# Patient Record
Sex: Female | Born: 1935 | Race: Black or African American | Hispanic: No | Smoking: Never smoker
Health system: Southern US, Community
[De-identification: ages and names within clinical notes are randomized; demographics above are authoritative.]

## PROBLEM LIST (undated history)

## (undated) DIAGNOSIS — E785 Hyperlipidemia, unspecified: Secondary | ICD-10-CM

## (undated) DIAGNOSIS — I10 Essential (primary) hypertension: Secondary | ICD-10-CM

## (undated) DIAGNOSIS — F0390 Unspecified dementia without behavioral disturbance: Secondary | ICD-10-CM

## (undated) DIAGNOSIS — D869 Sarcoidosis, unspecified: Secondary | ICD-10-CM

## (undated) DIAGNOSIS — M1990 Unspecified osteoarthritis, unspecified site: Secondary | ICD-10-CM

## (undated) DIAGNOSIS — I509 Heart failure, unspecified: Secondary | ICD-10-CM

## (undated) HISTORY — DX: Hyperlipidemia, unspecified: E78.5

## (undated) HISTORY — DX: Sarcoidosis, unspecified: D86.9

## (undated) HISTORY — DX: Essential (primary) hypertension: I10

## (undated) HISTORY — PX: TOTAL KNEE ARTHROPLASTY: SHX125

## (undated) HISTORY — DX: Heart failure, unspecified: I50.9

## (undated) HISTORY — DX: Unspecified dementia, unspecified severity, without behavioral disturbance, psychotic disturbance, mood disturbance, and anxiety: F03.90

## (undated) HISTORY — DX: Unspecified osteoarthritis, unspecified site: M19.90

## (undated) HISTORY — PX: ABDOMINAL HYSTERECTOMY: SHX81

---

## 2006-03-30 ENCOUNTER — Encounter: Admission: RE | Admit: 2006-03-30 | Discharge: 2006-03-30 | Payer: Self-pay | Admitting: Cardiovascular Disease

## 2006-04-04 ENCOUNTER — Ambulatory Visit (HOSPITAL_COMMUNITY): Admission: RE | Admit: 2006-04-04 | Discharge: 2006-04-04 | Payer: Self-pay | Admitting: Cardiovascular Disease

## 2006-04-25 ENCOUNTER — Encounter: Admission: RE | Admit: 2006-04-25 | Discharge: 2006-04-25 | Payer: Self-pay | Admitting: Internal Medicine

## 2007-06-21 ENCOUNTER — Encounter: Admission: RE | Admit: 2007-06-21 | Discharge: 2007-06-21 | Payer: Self-pay | Admitting: Internal Medicine

## 2008-08-20 ENCOUNTER — Encounter: Admission: RE | Admit: 2008-08-20 | Discharge: 2008-08-20 | Payer: Self-pay | Admitting: Internal Medicine

## 2010-08-24 ENCOUNTER — Encounter
Admission: RE | Admit: 2010-08-24 | Discharge: 2010-08-24 | Payer: Self-pay | Source: Home / Self Care | Attending: Internal Medicine | Admitting: Internal Medicine

## 2010-10-13 ENCOUNTER — Ambulatory Visit: Payer: Medicare Other | Attending: Neurology | Admitting: Physical Therapy

## 2010-10-13 DIAGNOSIS — R293 Abnormal posture: Secondary | ICD-10-CM | POA: Insufficient documentation

## 2010-10-13 DIAGNOSIS — IMO0001 Reserved for inherently not codable concepts without codable children: Secondary | ICD-10-CM | POA: Insufficient documentation

## 2010-10-13 DIAGNOSIS — R269 Unspecified abnormalities of gait and mobility: Secondary | ICD-10-CM | POA: Insufficient documentation

## 2010-10-19 ENCOUNTER — Ambulatory Visit: Payer: Medicare Other | Attending: Neurology | Admitting: Physical Therapy

## 2010-10-19 DIAGNOSIS — IMO0001 Reserved for inherently not codable concepts without codable children: Secondary | ICD-10-CM | POA: Insufficient documentation

## 2010-10-19 DIAGNOSIS — R293 Abnormal posture: Secondary | ICD-10-CM | POA: Insufficient documentation

## 2010-10-19 DIAGNOSIS — R269 Unspecified abnormalities of gait and mobility: Secondary | ICD-10-CM | POA: Insufficient documentation

## 2010-10-23 ENCOUNTER — Ambulatory Visit: Payer: Medicare Other | Admitting: Physical Therapy

## 2010-10-26 ENCOUNTER — Ambulatory Visit: Payer: Medicare Other | Admitting: Physical Therapy

## 2010-10-28 ENCOUNTER — Ambulatory Visit: Payer: Medicare Other | Admitting: Physical Therapy

## 2010-11-02 ENCOUNTER — Ambulatory Visit: Payer: Medicare Other | Admitting: Physical Therapy

## 2010-11-04 ENCOUNTER — Ambulatory Visit: Payer: Medicare Other | Admitting: Physical Therapy

## 2010-11-10 ENCOUNTER — Ambulatory Visit: Payer: BC Managed Care – PPO | Admitting: Physical Therapy

## 2010-11-12 ENCOUNTER — Ambulatory Visit: Payer: Medicare Other | Admitting: Physical Therapy

## 2010-11-16 ENCOUNTER — Ambulatory Visit: Payer: BC Managed Care – PPO | Admitting: Physical Therapy

## 2010-11-24 ENCOUNTER — Ambulatory Visit: Payer: Medicare Other | Attending: Neurology | Admitting: Physical Therapy

## 2010-11-24 DIAGNOSIS — IMO0001 Reserved for inherently not codable concepts without codable children: Secondary | ICD-10-CM | POA: Insufficient documentation

## 2010-11-24 DIAGNOSIS — R293 Abnormal posture: Secondary | ICD-10-CM | POA: Insufficient documentation

## 2010-11-24 DIAGNOSIS — R269 Unspecified abnormalities of gait and mobility: Secondary | ICD-10-CM | POA: Insufficient documentation

## 2010-11-26 ENCOUNTER — Encounter: Payer: BC Managed Care – PPO | Admitting: Physical Therapy

## 2010-11-26 ENCOUNTER — Ambulatory Visit: Payer: Medicare Other | Admitting: Physical Therapy

## 2010-11-30 ENCOUNTER — Ambulatory Visit: Payer: Medicare Other | Admitting: Physical Therapy

## 2010-12-02 ENCOUNTER — Ambulatory Visit: Payer: Medicare Other | Admitting: Physical Therapy

## 2010-12-08 ENCOUNTER — Ambulatory Visit: Payer: Medicare Other | Admitting: Physical Therapy

## 2010-12-10 ENCOUNTER — Ambulatory Visit: Payer: Medicare Other | Admitting: Physical Therapy

## 2010-12-15 ENCOUNTER — Encounter: Payer: BC Managed Care – PPO | Admitting: Physical Therapy

## 2010-12-17 ENCOUNTER — Encounter: Payer: BC Managed Care – PPO | Admitting: Physical Therapy

## 2011-01-01 NOTE — Cardiovascular Report (Signed)
NAMEMarland Kitchen  Connie Poole, Connie Poole NO.:  [id number]   MEDICAL RECORD NO.:  [id number]          PATIENT TYPE:  OIB   LOCATION:  2899                         FACILITY:  [facility]   PHYSICIAN:  Nanetta Batty, M.D.   DATE OF BIRTH:  08-25-1920   DATE OF PROCEDURE:  04/04/2006  DATE OF DISCHARGE:                              CARDIAC CATHETERIZATION   Connie Poole is an 77 year old African-American female with history of  progressive dyspnea on exertion.  Her other issues include diabetes,  hypertension, and hyperlipidemia.  She had a Myoview stress test which  showed anterolateral and inferolateral ischemia.  She presents now for  diagnostic coronary arteriography to rule out an ischemic etiology.   PROCEDURE DESCRIPTION:  Patient was brought to the second floor [place]  cardiac catheterization laboratory in a post absorptive state.  She was pre  medicated with p.o. Valium.  Her right groin was prepped and shaved in the  usual sterile fashion.  1% Xylocaine was used for local anesthesia.  A 6-  French sheath was inserted into the right femoral artery using standard  Seldinger technique.  A 6-French right and left Judkins diagnostic catheter  along with a 6-French pigtail catheter were used for selective coronary  angiography, left ventriculography, and distal abdominal aortography.  Visipaque dye was used for the entirety of the case.  Retrograde aorta, left  ventricular, and pullback pressures were recorded.   HEMODYNAMICS:  1. Aortic systolic pressure 170, diastolic pressure 64.  2. Left ventricular systolic pressure 170, end-diastolic pressure 12.   SELECTIVE CORONARY ANGIOGRAPHY:  1. Left main normal.  2. LAD:  LAD had a 30-40% stenosis in the proximal third at the takeoff of      first septal perforator branch.  3. Left circumflex; free of disease.  4. Right coronary; free of disease.  5. Left ventriculography; RAO left ventriculogram was performed using hand  injection because of inability to well seat the pigtail catheter.      Appeared to be normal LV function.  6. Distal abdominal aortography; distal abdominal aortogram was performed      using 20 mL of Visipaque dye at 20 mL per second.  The renal arteries      were widely patent.  The infrarenal abdominal aorta and iliac      bifurcation appear free of significant atherosclerotic changes.   IMPRESSION:  Ms. Lover has essentially normal coronary arteries and normal  left ventricular function.  I believe her Myoview abnormalities were  artifactual and her dyspnea probably is related to diastolic dysfunction  from hypertension.  Her renal arteries are normal suggesting essential  hypertension.  Continued medical therapy will be recommended.   The sheath was removed and pressure was held on the groin to achieve  hemostasis.  Patient left lab in stable condition.  She will be discharged  home after remaining __________ for six hours.  Will see a P.A. back in the  office in two weeks for groin check and me back in office in three months  for further evaluation.      Nanetta Batty,  M.D.  Electronically Signed     JB/MEDQ  D:  04/04/2006  T:  04/04/2006  Job:  [id number]   cc:   Cath Lab  [facility]  Olene Craven, M.D.

## 2011-01-14 ENCOUNTER — Encounter (INDEPENDENT_AMBULATORY_CARE_PROVIDER_SITE_OTHER): Payer: Self-pay | Admitting: Surgery

## 2011-09-22 ENCOUNTER — Ambulatory Visit
Admission: RE | Admit: 2011-09-22 | Discharge: 2011-09-22 | Disposition: A | Discharge status: Home / Self Care | Payer: Medicare Other | Source: Ambulatory Visit | Attending: Internal Medicine | Admitting: Internal Medicine

## 2011-09-22 ENCOUNTER — Other Ambulatory Visit: Payer: Self-pay | Admitting: Internal Medicine

## 2011-09-22 ENCOUNTER — Ambulatory Visit
Admission: RE | Admit: 2011-09-22 | Discharge: 2011-09-22 | Disposition: A | Discharge status: Home / Self Care | Payer: BC Managed Care – PPO | Source: Ambulatory Visit | Attending: Internal Medicine | Admitting: Internal Medicine

## 2011-09-22 DIAGNOSIS — R52 Pain, unspecified: Secondary | ICD-10-CM

## 2012-05-09 ENCOUNTER — Encounter (INDEPENDENT_AMBULATORY_CARE_PROVIDER_SITE_OTHER): Payer: Self-pay | Admitting: General Surgery

## 2012-06-09 ENCOUNTER — Encounter (INDEPENDENT_AMBULATORY_CARE_PROVIDER_SITE_OTHER): Payer: Self-pay

## 2012-06-13 ENCOUNTER — Ambulatory Visit (INDEPENDENT_AMBULATORY_CARE_PROVIDER_SITE_OTHER): Payer: Medicare Other | Admitting: General Surgery

## 2012-06-13 VITALS — BP 166/74 | HR 67 | Temp 97.9°F | Ht 62.0 in | Wt 197.6 lb

## 2012-06-13 DIAGNOSIS — K420 Umbilical hernia with obstruction, without gangrene: Secondary | ICD-10-CM | POA: Insufficient documentation

## 2012-06-13 NOTE — Addendum Note (Signed)
Addended by: Milas Hock on: 06/13/2012 11:33 AM   Modules accepted: Orders

## 2012-06-13 NOTE — Addendum Note (Signed)
Addended by: Milas Hock on: 06/13/2012 12:13 PM   Modules accepted: Orders

## 2012-06-13 NOTE — Progress Notes (Signed)
The patient is doing well since her last clinic visit to see me. Her periumbilical hernia has increased in size but is minimally symptomatic.  The patient has occasional bloating which causes a hernia to enlarge in size. However she's had no nausea or vomiting and minimal to no tenderness in that area.  On examination today the patient has a large umbilical hernia which is minimally tender to palpation. I can not get it reduced on examination.  The patient has early dementia which seems to be exacerbated in the morning. My concern is that the patient is to get anesthesia and this may cause a significant change in her neurologic status. Because of this I would prefer to get a CT scan of her abdomen and pelvis to confirm if there is bowel in the hernia. There is bowel in the hernia then I would recommend that we go ahead and get it repaired.  So we will go ahead and schedule a CT scan of the abdomen and pelvis with oral contrast.

## 2012-06-20 ENCOUNTER — Ambulatory Visit (HOSPITAL_COMMUNITY)
Admission: RE | Admit: 2012-06-20 | Discharge: 2012-06-20 | Disposition: A | Discharge status: Home / Self Care | Payer: Medicare Other | Source: Ambulatory Visit | Attending: General Surgery | Admitting: General Surgery

## 2012-06-20 ENCOUNTER — Other Ambulatory Visit (INDEPENDENT_AMBULATORY_CARE_PROVIDER_SITE_OTHER): Payer: Self-pay

## 2012-06-20 ENCOUNTER — Encounter (HOSPITAL_COMMUNITY): Payer: Self-pay

## 2012-06-20 DIAGNOSIS — Q619 Cystic kidney disease, unspecified: Secondary | ICD-10-CM | POA: Insufficient documentation

## 2012-06-20 DIAGNOSIS — K429 Umbilical hernia without obstruction or gangrene: Secondary | ICD-10-CM | POA: Insufficient documentation

## 2012-06-20 DIAGNOSIS — K420 Umbilical hernia with obstruction, without gangrene: Secondary | ICD-10-CM

## 2012-06-20 DIAGNOSIS — K8020 Calculus of gallbladder without cholecystitis without obstruction: Secondary | ICD-10-CM | POA: Insufficient documentation

## 2012-06-20 LAB — CREATININE, SERUM
Creatinine, Ser: 1.13 mg/dL — ABNORMAL HIGH (ref 0.50–1.10)
GFR calc Af Amer: 48 mL/min — ABNORMAL LOW (ref >90)

## 2012-06-20 MED ORDER — IOHEXOL 300 MG/ML  SOLN
100.0000 mL | Freq: Once | INTRAMUSCULAR | Status: AC | PRN
  Administered 2012-06-20: 100 mL via INTRAVENOUS

## 2012-08-11 ENCOUNTER — Emergency Department (HOSPITAL_BASED_OUTPATIENT_CLINIC_OR_DEPARTMENT_OTHER)
Admission: EM | Admit: 2012-08-11 | Discharge: 2012-08-11 | Disposition: A | Discharge status: Home / Self Care | Payer: Medicare Other | Attending: Emergency Medicine | Admitting: Emergency Medicine

## 2012-08-11 ENCOUNTER — Encounter (HOSPITAL_BASED_OUTPATIENT_CLINIC_OR_DEPARTMENT_OTHER): Payer: Self-pay | Admitting: *Deleted

## 2012-08-11 ENCOUNTER — Emergency Department (HOSPITAL_BASED_OUTPATIENT_CLINIC_OR_DEPARTMENT_OTHER): Payer: Medicare Other

## 2012-08-11 DIAGNOSIS — H9209 Otalgia, unspecified ear: Secondary | ICD-10-CM | POA: Insufficient documentation

## 2012-08-11 DIAGNOSIS — Z7982 Long term (current) use of aspirin: Secondary | ICD-10-CM | POA: Insufficient documentation

## 2012-08-11 DIAGNOSIS — R509 Fever, unspecified: Secondary | ICD-10-CM | POA: Insufficient documentation

## 2012-08-11 DIAGNOSIS — E119 Type 2 diabetes mellitus without complications: Secondary | ICD-10-CM | POA: Insufficient documentation

## 2012-08-11 DIAGNOSIS — F0390 Unspecified dementia without behavioral disturbance: Secondary | ICD-10-CM | POA: Insufficient documentation

## 2012-08-11 DIAGNOSIS — I110 Hypertensive heart disease with heart failure: Secondary | ICD-10-CM | POA: Insufficient documentation

## 2012-08-11 DIAGNOSIS — H409 Unspecified glaucoma: Secondary | ICD-10-CM | POA: Insufficient documentation

## 2012-08-11 DIAGNOSIS — Z8709 Personal history of other diseases of the respiratory system: Secondary | ICD-10-CM | POA: Insufficient documentation

## 2012-08-11 DIAGNOSIS — B9789 Other viral agents as the cause of diseases classified elsewhere: Secondary | ICD-10-CM | POA: Insufficient documentation

## 2012-08-11 DIAGNOSIS — Z79899 Other long term (current) drug therapy: Secondary | ICD-10-CM | POA: Insufficient documentation

## 2012-08-11 DIAGNOSIS — B349 Viral infection, unspecified: Secondary | ICD-10-CM

## 2012-08-11 DIAGNOSIS — E785 Hyperlipidemia, unspecified: Secondary | ICD-10-CM | POA: Insufficient documentation

## 2012-08-11 DIAGNOSIS — Z8739 Personal history of other diseases of the musculoskeletal system and connective tissue: Secondary | ICD-10-CM | POA: Insufficient documentation

## 2012-08-11 DIAGNOSIS — H9202 Otalgia, left ear: Secondary | ICD-10-CM

## 2012-08-11 DIAGNOSIS — I509 Heart failure, unspecified: Secondary | ICD-10-CM | POA: Insufficient documentation

## 2012-08-11 LAB — CBC WITH DIFFERENTIAL/PLATELET
Basophils Absolute: 0 10*3/uL (ref 0.0–0.1)
Basophils Relative: 0 % (ref 0–1)
Eosinophils Absolute: 0.1 10*3/uL (ref 0.0–0.7)
Eosinophils Relative: 1 % (ref 0–5)
HCT: 33.6 % — ABNORMAL LOW (ref 36.0–46.0)
Lymphocytes Relative: 37 % (ref 12–46)
MCH: 28.1 pg (ref 26.0–34.0)
MCHC: 32.1 g/dL (ref 30.0–36.0)
MCV: 87.5 fL (ref 78.0–100.0)
Monocytes Absolute: 0.6 10*3/uL (ref 0.1–1.0)
RDW: 13.2 % (ref 11.5–15.5)

## 2012-08-11 LAB — COMPREHENSIVE METABOLIC PANEL
AST: 18 U/L (ref 0–37)
CO2: 28 mEq/L (ref 19–32)
Calcium: 9.5 mg/dL (ref 8.4–10.5)
Creatinine, Ser: 0.9 mg/dL (ref 0.50–1.10)
GFR calc non Af Amer: 55 mL/min — ABNORMAL LOW (ref >90)
Total Protein: 6.9 g/dL (ref 6.0–8.3)

## 2012-08-11 MED ORDER — IBUPROFEN 600 MG PO TABS: 600.0000 mg | ORAL_TABLET | Freq: Four times a day (QID) | ORAL | Status: DC | PRN

## 2012-08-11 NOTE — ED Provider Notes (Signed)
History     CSN: [id number]  Arrival date & time 08/11/12  1259   First MD Initiated Contact with Patient 08/11/12 1345      Chief Complaint  Patient presents with  . Otalgia    (Consider location/radiation/quality/duration/timing/severity/associated sxs/prior treatment) The history is provided by the patient and a relative.  Connie Poole is a 78 y.o. female history of diabetes, CHF, mild dementia, hypertension here with left ear pain and cough. Left ear pain intermittent for a week and got worse yesterday. The pain radiated down her left neck. She had similar symptoms in February and was diagnosed with cervical radiculopathy. She had some subjective fevers but did not check the temperature. She also had been having productive cough with whitish sputum. Denies any vomiting or abdominal pain. No chest pain or SOB.    Level V caveat- dementia   Past Medical History  Diagnosis Date  . Diabetes mellitus   . CHF (congestive heart failure)   . Glaucoma   . Dementia   . Hypertension   . Hyperlipidemia   . Sarcoidosis   . Arthritis     Past Surgical History  Procedure Date  . Abdominal hysterectomy   . Total knee arthroplasty     Family History  Problem Relation Age of Onset  . Stroke Mother   . Heart disease Father   . Cancer Brother     History  Substance Use Topics  . Smoking status: Not on file  . Smokeless tobacco: Not on file  . Alcohol Use: No    OB History    Grav Para Term Preterm Abortions TAB SAB Ect Mult Living                  Review of Systems  Constitutional: Positive for fever.  HENT: Positive for ear pain.   All other systems reviewed and are negative.    Allergies  Celebrex and Vioxx  Home Medications   Current Outpatient Rx  Name  Route  Sig  Dispense  Refill  . ACCU-CHEK COMPACT TEST DRUM VI STRP               . ACCU-CHEK SOFTCLIX LANCETS MISC               . ALISKIREN FUMARATE 300 MG PO TABS   Oral   Take 300 mg by  mouth daily.         . ASPIRIN 81 MG PO TABS   Oral   Take 81 mg by mouth daily.         . ATORVASTATIN CALCIUM 40 MG PO TABS   Oral   Take 40 mg by mouth daily.         Marland Kitchen BIMATOPROST 0.03 % OP SOLN      1 drop at bedtime.         Marland Kitchen BRIMONIDINE TARTRATE 0.1 % OP SOLN               . CALCIUM 600+D PO   Oral   Take by mouth.         Marland Kitchen CARVEDILOL 25 MG PO TABS               . DORZOLAMIDE HCL 2 % OP SOLN      1 drop 3 (three) times daily.         . OMEGA-3 FATTY ACIDS 1000 MG PO CAPS   Oral   Take 2 g by mouth daily.         Marland Kitchen  FUROSEMIDE 40 MG PO TABS               . GINKGO BILOBA 40 MG PO TABS   Oral   Take by mouth.         . GUANFACINE HCL 2 MG PO TABS               . KLOR-CON M20 20 MEQ PO TBCR               . LORATADINE 10 MG PO TABS   Oral   Take 10 mg by mouth daily.         . CEREFOLIN NAC 6-2-600 MG PO TABS               . NEOMYCIN-BACITRACIN ZN-POLYMYX 5-400-10000 OP OINT               . OLMESARTAN MEDOXOMIL 40 MG PO TABS   Oral   Take 40 mg by mouth daily.         Marland Kitchen OMEPRAZOLE 20 MG PO CPDR   Oral   Take 20 mg by mouth daily.         Marland Kitchen SAXAGLIPTIN HCL 2.5 MG PO TABS   Oral   Take by mouth daily.         Marland Kitchen TEMAZEPAM 15 MG PO CAPS   Oral   Take 15 mg by mouth at bedtime as needed.         Marland Kitchen TRAMADOL HCL 50 MG PO TABS               . VITAMIN A & D [address] UNITS PO TABS   Oral   Take by mouth.           BP 172/63  Pulse 67  Temp 98.5 F (36.9 C) (Oral)  Ht 5\' 1"  (1.549 m)  Wt 197 lb (89.359 kg)  BMI 37.22 kg/m2  SpO2 100%  Physical Exam  Nursing note and vitals reviewed. Constitutional: She is oriented to person, place, and time. She appears well-developed and well-nourished.       Pleasant, slightly demented   HENT:  Head: Normocephalic.  Right Ear: External ear normal.  Left Ear: External ear normal.  Mouth/Throat: Oropharynx is clear and moist.  Eyes:  Conjunctivae normal are normal. Pupils are equal, round, and reactive to light.  Neck: Normal range of motion.       + tender and slightly enlarged L posterior lymph nodes and anterior lymph nodes.   Cardiovascular: Normal rate, regular rhythm and normal heart sounds.   Pulmonary/Chest: Effort normal and breath sounds normal. No respiratory distress. She has no wheezes. She has no rales.       No crackles no JVD  Abdominal: Soft. Bowel sounds are normal. She exhibits no distension. There is no tenderness. There is no rebound.  Musculoskeletal: Normal range of motion.       1+ edema, chronic   Neurological: She is alert and oriented to person, place, and time.  Skin: Skin is warm and dry.  Psychiatric: She has a normal mood and affect. Her behavior is normal. Judgment and thought content normal.    ED Course  Procedures (including critical care time)  Labs Reviewed  CBC WITH DIFFERENTIAL - Abnormal; Notable for the following:    RBC 3.84 (*)     Hemoglobin 10.8 (*)     HCT 33.6 (*)     All other components within normal limits  COMPREHENSIVE METABOLIC PANEL -  Abnormal; Notable for the following:    Glucose, Bld 160 (*)     GFR calc non Af Amer 55 (*)     GFR calc Af Amer 64 (*)     All other components within normal limits   Dg Chest 2 View  08/11/2012  *RADIOLOGY REPORT*  Clinical Data: Congestive heart failure, diabetes, cough  CHEST - 2 VIEW  Comparison: Chest radiograph 03/30/2006  Findings: Normal mediastinum and heart silhouette.  Costophrenic angles are clear.  No effusion, infiltrate, or pneumothorax.  There are bronchitic markings centrally which are unchanged from prior. There is degenerative changes of the spine with kyphosis which is advanced compared to prior.  IMPRESSION: 1.  No acute cardiopulmonary findings. 2.  Advanced degenerative changes of the spine.   Original Report Authenticated By: Genevive Bi, M.D.      No diagnosis found.    MDM  Connie Poole is  a 78 y.o. female here with cough, fever, L ear pain. She likely has viral syndrome, but will get CXR and labs to r/o pneumonia. Will reassess.   3:15 PM CXR showed no pneumonia, CBC showed nl WBC, Hg at baseline. She has viral syndrome. Recommend NSAIDs prn.        Richardean Canal, MD 08/11/12 [phone]

## 2012-08-11 NOTE — ED Notes (Signed)
Pt to triage in w/c c/o left ear pain x 1 week with possible subjective temps. Denies any other c/o.

## 2013-08-15 ENCOUNTER — Other Ambulatory Visit: Payer: Self-pay | Admitting: Nurse Practitioner

## 2013-08-15 DIAGNOSIS — W19XXXA Unspecified fall, initial encounter: Secondary | ICD-10-CM

## 2013-08-22 ENCOUNTER — Ambulatory Visit
Admission: RE | Admit: 2013-08-22 | Discharge: 2013-08-22 | Disposition: A | Discharge status: Home / Self Care | Payer: Medicare Other | Source: Ambulatory Visit | Attending: Nurse Practitioner | Admitting: Nurse Practitioner

## 2013-08-22 DIAGNOSIS — R519 Headache, unspecified: Secondary | ICD-10-CM

## 2013-08-22 DIAGNOSIS — R51 Headache: Secondary | ICD-10-CM

## 2013-08-22 DIAGNOSIS — W19XXXA Unspecified fall, initial encounter: Secondary | ICD-10-CM

## 2013-08-22 MED ORDER — IOHEXOL 300 MG/ML  SOLN
75.0000 mL | Freq: Once | INTRAMUSCULAR | Status: AC | PRN
  Administered 2013-08-22: 75 mL via INTRAVENOUS

## 2013-08-23 ENCOUNTER — Other Ambulatory Visit: Payer: Medicare Other

## 2013-10-10 ENCOUNTER — Other Ambulatory Visit: Payer: Self-pay | Admitting: Internal Medicine

## 2013-10-10 ENCOUNTER — Ambulatory Visit
Admission: RE | Admit: 2013-10-10 | Discharge: 2013-10-10 | Disposition: A | Discharge status: Home / Self Care | Payer: Medicare Other | Source: Ambulatory Visit | Attending: Internal Medicine | Admitting: Internal Medicine

## 2013-10-10 DIAGNOSIS — R053 Chronic cough: Secondary | ICD-10-CM

## 2013-10-10 DIAGNOSIS — R05 Cough: Secondary | ICD-10-CM

## 2015-11-18 ENCOUNTER — Ambulatory Visit (INDEPENDENT_AMBULATORY_CARE_PROVIDER_SITE_OTHER): Payer: Medicare Other | Admitting: Neurology

## 2015-11-18 ENCOUNTER — Encounter: Payer: Self-pay | Admitting: Neurology

## 2015-11-18 VITALS — BP 140/80 | HR 76 | Resp 20 | Ht 61.0 in | Wt 193.0 lb

## 2015-11-18 DIAGNOSIS — R441 Visual hallucinations: Secondary | ICD-10-CM | POA: Insufficient documentation

## 2015-11-18 DIAGNOSIS — G3183 Dementia with Lewy bodies: Secondary | ICD-10-CM | POA: Diagnosis not present

## 2015-11-18 DIAGNOSIS — F0280 Dementia in other diseases classified elsewhere without behavioral disturbance: Secondary | ICD-10-CM | POA: Diagnosis not present

## 2015-11-18 DIAGNOSIS — G4752 REM sleep behavior disorder: Secondary | ICD-10-CM | POA: Insufficient documentation

## 2015-11-18 MED ORDER — CLONAZEPAM 0.5 MG PO TABS: ORAL_TABLET | ORAL | Status: DC

## 2015-11-18 NOTE — Patient Instructions (Signed)
REM behaviour disorder  Dementia With Lewy Bodies Dementia with Lewy bodies is a common type of dementia that gets progressively worse with time. Typical characteristics include progressive worsening of mental function combined with 3 other features: seeing things that are not there (hallucinations), significant fluctuations in alertness and attention, and changes in movement similar to Parkinson's disease (rigid muscles, slowed movement, and tremors). Dementia with Lewy bodies has many similar symptoms that Parkinson's disease and Alzheimer's disease has. CAUSES The symptoms of dementia with Lewy bodies are caused by the buildup of Lewy bodies, which are proteins that build up in brain cells and affect mental function and movement. No one knows exactly what causes the buildup of Lewy bodies, but it may be related to Alzheimer's dementia or Parkinson's disease.  SYMPTOMS   Memory problems.  Confusion.  Reduced attention span.  Hallucinations.  False ideas about another person or situation (delusions).  Trouble moving (rigid muscles, slowed movement, and tremors).  Shuffling movements (gait).  Sleep problems (acting out dreams).  Fluctuating attention (periods of drowsiness or lethargy, long periods of time spent staring into space, or disorganized speech). DIAGNOSIS There is no specific test to diagnose dementia with Lewy bodies, but your caregiver will evaluate you based on your symptoms and physical exam. Your evaluation may also include:  Memory testing.  Lab tests.  Brain imaging (CT scan, MRI).  Electroencephalogram (EEG).  Lumbar puncture. TREATMENT  There is no cure for dementia with Lewy bodies. Medicines may be prescribed to help with mental function and movement.  HOME CARE INSTRUCTIONS The care of individuals with dementia is varied and dependent upon the progression of the dementia. The following suggestions are intended for the person living with, or caring for,  the person with dementia.  Create a safe environment.  Remove the locks on bathroom doors to prevent the person from accidentally locking himself or herself in.  Use childproof latches on kitchen cabinets and any place where cleaning supplies, chemicals, or alcohol are kept.  Use childproof covers in unused electrical outlets.  Install childproof devices to keep doors and windows secured.  Remove stove knobs or install safety knobs and an automatic shut-off on the stove.  Lower the temperature on water heaters.  Label medicines and keep them locked up.  Secure knives, lighters, matches, power tools, and guns, and keep these items out of reach.  Keep the house free from clutter. Remove rugs or anything that might contribute to a fall.  Remove objects that might break and hurt the person.  Make sure lighting is good, both inside and outside.  Install grab rails as needed.  Use a monitoring device to alert you to falls or other needs for help.  Reduce confusion.  Keep familiar objects and people around.  Use night lights or dim lights at night.  Label items or areas.  Use reminders, notes, or directions for daily activities or tasks.  Keep a simple, consistent routine for waking, meals, bathing, dressing, and bedtime.  Create a calm, quiet environment.  Place large clocks and calendars prominently.  Display emergency numbers and the home address near all telephones.  Use cues to establish different times of the day. An example is to open curtains to let the natural light in during the day.  Use effective communication.  Choose simple words and short sentences.  Use a gentle, calm tone of voice.  Be careful not to interrupt.  If the person is struggling to find a word or communicate a thought,  try to provide the word or thought.  Ask one question at a time. Allow the person ample time to answer questions. Repeat the question again if the person does not  respond.  Reduce nighttime restlessness.  Provide a comfortable bed.  Have a consistent nighttime routine.  Ensure a regular walking or physical activity schedule. Involve the person in daily activities as much as possible.  Limit napping during the day.  Limit caffeine.  Attend social events that stimulate rather than overwhelm the senses.  Encourage good nutrition and hydration.  Reduce distractions during meal times and snacks.  Avoid foods that are too hot or too cold.  Monitor chewing and swallowing ability.  Continue with routine vision, hearing, dental, and medical screenings.  Only give over-the-counter or prescription medicines as directed by the caregiver.  Monitor driving abilities. Do not allow the person to drive when safe driving is no longer possible.  Register with an identification program which could provide location assistance in the event of a missing person situation. SEEK MEDICAL CARE IF:  New behavioral problems develop, such as moodiness or aggressiveness.  Any new problem with brain function develop, such as balance, speech, or falling a lot.  Problems with swallowing develop. Small changes or worsening in any aspect of brain function can be a sign that the illness is getting worse. It can also be a sign of another medical illness such as infection. Seeing a caregiver right away is important.  SEEK IMMEDIATE MEDICAL CARE IF:  A fever develops.  Confusion develops or worsens.  New or worsened sleepiness develops or staying awake becomes difficult.   This information is not intended to replace advice given to you by your health care provider. Make sure you discuss any questions you have with your health care provider.   Document Released: 04/24/2002 Document Revised: 10/25/2011 Document Reviewed: 03/29/2011 Elsevier Interactive Patient Education 2016 Elsevier Inc. Clonazepam tablets What is this medicine? CLONAZEPAM (kloe NA ze pam) is a  benzodiazepine. It is used to treat certain types of seizures. It is also used to treat panic disorder. This medicine may be used for other purposes; ask your health care provider or pharmacist if you have questions. What should I tell my health care provider before I take this medicine? They need to know if you have any of these conditions: -an alcohol or drug abuse problem -bipolar disorder, depression, psychosis or other mental health condition -glaucoma -kidney or liver disease -lung or breathing disease -myasthenia gravis -Parkinson's disease -porphyria -seizures or a history of seizures -suicidal thoughts -an unusual or allergic reaction to clonazepam, other benzodiazepines, foods, dyes, or preservatives -pregnant or trying to get pregnant -breast-feeding How should I use this medicine? Take this medicine by mouth with a glass of water. Follow the directions on the prescription label. If it upsets your stomach, take it with food or milk. Take your medicine at regular intervals. Do not take it more often than directed. Do not stop taking or change the dose except on the advice of your doctor or health care professional. A special MedGuide will be given to you by the pharmacist with each prescription and refill. Be sure to read this information carefully each time. Talk to your pediatrician regarding the use of this medicine in children. Special care may be needed. Overdosage: If you think you have taken too much of this medicine contact a poison control center or emergency room at once. NOTE: This medicine is only for you. Do not share this  medicine with others. What if I miss a dose? If you miss a dose, take it as soon as you can. If it is almost time for your next dose, take only that dose. Do not take double or extra doses. What may interact with this medicine? -herbal or dietary supplements -medicines for depression, anxiety, or psychotic disturbances -medicines for fungal  infections like fluconazole, itraconazole, ketoconazole, voriconazole -medicines for HIV infection or AIDS -medicines for sleep -prescription pain medicines -propantheline -rifampin -sevelamer -some medicines for seizures like carbamazepine, phenobarbital, phenytoin, primidone This list may not describe all possible interactions. Give your health care provider a list of all the medicines, herbs, non-prescription drugs, or dietary supplements you use. Also tell them if you smoke, drink alcohol, or use illegal drugs. Some items may interact with your medicine. What should I watch for while using this medicine? Visit your doctor or health care professional for regular checks on your progress. Your body may become dependent on this medicine. If you have been taking this medicine regularly for some time, do not suddenly stop taking it. You must gradually reduce the dose or you may get severe side effects. Ask your doctor or health care professional for advice before increasing or decreasing the dose. Even after you stop taking this medicine it can still affect your body for several days. If you suffer from several types of seizures, this medicine may increase the chance of grand mal seizures (epilepsy). Let your doctor or health care professional know, he or she may want to prescribe an additional medicine. You may get drowsy or dizzy. Do not drive, use machinery, or do anything that needs mental alertness until you know how this medicine affects you. To reduce the risk of dizzy and fainting spells, do not stand or sit up quickly, especially if you are an older patient. Alcohol may increase dizziness and drowsiness. Avoid alcoholic drinks. Do not treat yourself for coughs, colds or allergies without asking your doctor or health care professional for advice. Some ingredients can increase possible side effects. The use of this medicine may increase the chance of suicidal thoughts or actions. Pay special  attention to how you are responding while on this medicine. Any worsening of mood, or thoughts of suicide or dying should be reported to your health care professional right away. Women who become pregnant while using this medicine may enroll in the [place] Antiepileptic Drug Pregnancy Registry by calling [phone]. This registry collects information about the safety of antiepileptic drug use during pregnancy. What side effects may I notice from receiving this medicine? Side effects that you should report to your doctor or health care professional as soon as possible: -allergic reactions like skin rash, itching or hives, swelling of the face, lips, or tongue -changes in vision -confusion -depression -hallucinations -mood changes, excitability or aggressive behavior -movement difficulty, staggering or jerky movements -muscle cramps, weakness -tremors -unusual eye movements Side effects that usually do not require medical attention (report to your doctor or health care professional if they continue or are bothersome): -constipation or diarrhea -difficulty sleeping, nightmares -dizziness, drowsiness -headache -increased saliva from your mouth -nausea, vomiting This list may not describe all possible side effects. Call your doctor for medical advice about side effects. You may report side effects to FDA at 1-800-FDA-1088. Where should I keep my medicine? Keep out of the reach of children. This medicine can be abused. Keep your medicine in a safe place to protect it from theft. Do not share this medicine with  anyone. Selling or giving away this medicine is dangerous and against the law. This medicine may cause accidental overdose and death if taken by other adults, children, or pets. Mix any unused medicine with a substance like cat litter or coffee grounds. Then throw the medicine away in a sealed container like a sealed bag or a coffee can with a lid. Do not use the medicine after the  expiration date. Store at room temperature between 15 and 30 degrees C (59 and 86 degrees F). Protect from light. Keep container tightly closed. NOTE: This sheet is a summary. It may not cover all possible information. If you have questions about this medicine, talk to your doctor, pharmacist, or health care provider.    2016, Elsevier/Gold Standard. (2014-11-12 14:01:43)

## 2015-11-18 NOTE — Progress Notes (Signed)
SLEEP MEDICINE CLINIC   Provider:  Melvyn Novas, M D  Referring Provider: Dorothyann Peng, MD Primary Care Physician:  Gwynneth Aliment, MD  No chief complaint on file.   HPI:  Connie Poole is a 82 y.o. female , seen here as a referral  from Dr. Allyne Gee for a sleep evaluation. Her daughter is here with her, her mother lives since 2001 in her home.  She was seen on 09-24-2011, Dr Marjory Lies .   Chief complaint according to patient : Her daughter reports nightmares.    Connie Poole daughter , Dois Davenport , reports that her mother was diagnosed with dementia by Dr. Jake Samples, a neurologist here in [place]. He also mentioned that she likely has REM behavior disorder. Dois Davenport has noted her mother to have mostly acted out dreams in the early morning hours towards the end of her sleep period - when REM sleep periods were most prevalent. It has now become more prevalent,   she has daytime hallucinations of visual nature,  she sees people, especially children ,  that are not there to be seen for others. she sees children  that are not truly there but she does not have auditory hallucinations , she does not hear voices or commands that she has to follow.  Connie Poole has noted that her mother often asked why the children are there, or if what she is going to feed them? She seems to have some doubts about her own perceptions or misperceptions, but she does not feel frightened as this is not  nightmarish , she feels not threatened by her visual hallucinations. Visual hallucinations have become more frequent and to some degree they became more detailed. Her daughter has resumed the position that she cannot see her mothers invisible friends or children but she is not debating their existence. Connie Poole has never called the police or the fire department. She has become a little less mobile over the last 2 years and she is now walking with a walker. She continues to have physical therapy twice a week as  initiated by Dr. Allyne Gee. Vivid dreams continue as well as visual hallucinations in daytime. She has fallen out of bed, has been found to have the bedside table toppled and the lamp fell on the floor.   Sleep habits are as follows: [name] is described as a night owl- very active at night. Dinner is from 7 to 7.45 Pm, and the family will sit together for a couple of hours. She likes to watch jeopardy with her daughter. She enjoys sleeping in a chair or recliner and mentioned that she feels her dreams don't last as long and don't become his intentions but she sleeps about position. Connie Poole seeks the company of others and does not enjoy being alone in her room. Once she goes to bed it is usually around midnight. It used to be that she got the most restful sleep and sound sleep between 7 AM and 10 AM. This has changed somewhat. Now she seems to wake up around 7 AM and is usually getting dressed at that time. She goes to the bathroom by herself without any help and usually at night does this once or twice. She says that she likes to stay awake and actually tries to stay awake at night to avoid the vivid dreams. Her son in law rises at 5.45 and he has often noted her to be awake.  Connie Poole is always afraid of imposing.  The daughter  has the impression her mother will sleep for about 4-5 hours of sleep.  In daytime she is sleepy as well and nods off. Before the hallucinations started, her family felt comfortable to run an errant and leave her by herself, - but not now. The daytime hallucinations concern her daughter.  She may sleep up to 4 hours in little portions during the daytime.  She no longer has a defined day and nighttime cycle.  Sleep medical history and family sleep history: maternal grandmother died at 65 of a stroke, no PD or dementia, but her mother had memory loss. Mrs. Andrew 4 year younger brother at 17 years of age has dementia.  Social history: lives with daughter since 2001.  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   Review of Systems: Out of a complete 14 system review, the patient complains of only the following symptoms, and all other reviewed systems are negative.  Acting out dreams at night, but is not sleep walking. Thrashes about in bed, may yell out, but doesn't leave the bed .   Epworth score 22 . sleeping all the time.    Social History   Social History  . Marital Status: Widowed    Spouse Name: N/A  . Number of Children: N/A  . Years of Education: N/A   Occupational History  . Not on file.   Social History Main Topics  . Smoking status: Not on file  . Smokeless tobacco: Not on file  . Alcohol Use: No  . Drug Use: Not on file  . Sexual Activity: Not on file   Other Topics Concern  . Not on file   Social History Narrative  . No narrative on file    Family History  Problem Relation Age of Onset  . Stroke Mother   . Heart disease Father   . Cancer Brother     Past Medical History  Diagnosis Date  . Diabetes mellitus   . CHF (congestive heart failure)   . Glaucoma   . Dementia   . Hypertension   . Hyperlipidemia   . Sarcoidosis   . Arthritis     Past Surgical History  Procedure Laterality Date  . Abdominal hysterectomy    . Total knee arthroplasty      Current Outpatient Prescriptions  Medication Sig Dispense Refill  . ACCU-CHEK COMPACT TEST DRUM test strip     . ACCU-CHEK SOFTCLIX LANCETS lancets     . aliskiren (TEKTURNA) 300 MG tablet Take 300 mg by mouth daily.    Marland Kitchen aspirin 81 MG tablet Take 81 mg by mouth daily.    Marland Kitchen atorvastatin (LIPITOR) 40 MG tablet Take 40 mg by mouth daily.    . bimatoprost (LUMIGAN) 0.03 % ophthalmic solution 1 drop at bedtime.    . brimonidine (ALPHAGAN P) 0.1 % SOLN     . Calcium Carbonate-Vitamin D (CALCIUM 600+D PO) Take by mouth.    . carvedilol (COREG) 25 MG tablet     . dorzolamide (TRUSOPT) 2 % ophthalmic solution 1 drop 3 (three) times daily.    . fish  oil-omega-3 fatty acids 1000 MG capsule Take 2 g by mouth daily.    . furosemide (LASIX) 40 MG tablet     . Ginkgo Biloba 40 MG TABS Take by mouth.    . guanFACINE (TENEX) 2 MG tablet     . ibuprofen (ADVIL,MOTRIN) 600 MG tablet Take 1 tablet (600 mg total) by mouth every 6 (six) hours  as needed for pain. 20 tablet 0  . KLOR-CON M20 20 MEQ tablet     . loratadine (CLARITIN) 10 MG tablet Take 10 mg by mouth daily.    . Melatonin 1 MG TABS Take by mouth.    . Methylfol-Methylcob-Acetylcyst (CEREFOLIN NAC) 6-2-600 MG TABS     . neomycin-bacitracin-polymyxin (POLYSPORIN) ophthalmic ointment     . olmesartan (BENICAR) 40 MG tablet Take 40 mg by mouth daily.    Marland Kitchen omeprazole (PRILOSEC) 20 MG capsule Take 20 mg by mouth daily.    . saxagliptin HCl (ONGLYZA) 2.5 MG TABS tablet Take by mouth daily.    . sertraline (ZOLOFT) 50 MG tablet Take 50 mg by mouth daily.    . temazepam (RESTORIL) 15 MG capsule Take 15 mg by mouth at bedtime as needed.    . traMADol (ULTRAM) 50 MG tablet     . valsartan (DIOVAN) 160 MG tablet Take 160 mg by mouth daily.    . Vitamins A & D (VITAMIN A & D) [address] UNITS TABS Take by mouth.     No current facility-administered medications for this visit.    Allergies as of 11/18/2015 - Review Complete 08/11/2012  Allergen Reaction Noted  . Celebrex [celecoxib]  06/09/2012  . Vioxx [rofecoxib]  01/14/2011    Vitals: There were no vitals taken for this visit. Last Weight:  Wt Readings from Last 1 Encounters:  08/11/12 197 lb (89.359 kg)   WUJ:WJXBJ is no weight on file to calculate BMI.     Last Height:   Ht Readings from Last 1 Encounters:  08/11/12 5\' 1"  (1.549 m)    Physical exam:  General: The patient is awake, alert and appears not in acute distress. The patient is well groomed. Head: Normocephalic, atraumatic. Neck is supple. Mallampati 3,  neck circumference:15 . Nasal airflow unrestricted , TMJ is not  evident . Retrognathia is notseen.  Cardiovascular:   Regular rate and rhythm, without  murmurs or carotid bruit, and without distended neck veins. Respiratory: Lungs are clear to auscultation. Skin:  Severe ankle  edema  Trunk: BMI is 193 pounds at 5.1". The patient's posture is hunched.   Neurologic exam : The patient is awake and alert, oriented to place and time.   Memory subjective  described as impaired, day to day very variable.  Memory testing revealed   MOCA:No flowsheet data found. MMSE: MMSE - Mini Mental State Exam 11/18/2015  Orientation to time 4  Orientation to Place 3  Registration 2  Attention/ Calculation 5  Recall 3  Language- name 2 objects 2  Language- repeat 0  Language- follow 3 step command 3  Language- read & follow direction 1  Write a sentence 1  Copy design 0  Total score 24       Attention span & concentration ability appears normal.  Speech is fluent,  without dysarthria, dysphonia or aphasia.  Mood and affect are appropriate.  Cranial nerves: Pupils are equal and briskly reactive to light. Extraocular movements  in vertical and horizontal planes intact and without nystagmus. Visual fields by finger perimetry are intact. Hearing to finger rub intact.   Facial sensation intact to fine touch.  Facial motor strength is symmetric and tongue and uvula move midline. Shoulder shrug was symmetrical.   Motor exam: elevated  tone, but no cog wheeling , symmetric muscle bulk and symmetric strength in all extremities.  Sensory:  Fine touch, pinprick and vibration were tested in all extremities. Proprioception tested in the  upper extremities was normal.  Coordination: Rapid alternating movements in the fingers/hands was normal. Finger-to-nose maneuver  normal without evidence of ataxia, dysmetria or tremor.  Gait and station: Patient walks with a walker/ rollator as  assistive device and is able unassisted to rise form her seated position. Uses her arms to brace herself.  Stance is stable ( with walker )  and  of wider base.   Deep tendon reflexes: in the  upper and lower extremities are  Attenuated , but symmetrically . Babinski maneuver response is  downgoing.  The patient was advised of the nature of the diagnosed sleep disorder , the treatment options and risks for general a health and wellness arising from not treating the condition.  I spent more than 55 minutes of face to face time with the patient. Greater than 50% of time was spent in counseling and coordination of care. We have discussed the diagnosis and differential and I answered the patient's questions.     Assessment:  After physical and neurologic examination, review of laboratory studies,  Personal review of imaging studies, reports of other /same  Imaging studies ,  Results of polysomnography/ neurophysiology testing and pre-existing records as far as provided in visit., my assessment is   0) the patient doesn't have a mild form of dementia and she scored today 24 out of 30 on the Mini-Mental Status Examination. These tests have to be adjusted for the patient's educational background. Her daughter is aware that the patient's ability and cognitive function varies greatly day by day, another indication for Lewy body dementia.   1) Mrs. Yetta BarreJones gait has significantly changed over the last 2 years and probably again over the last 6 months, she is usually bend forwards she leans more on her Rollator. She has pain in her left knee and her right knee had surgery and is swollen but seems to be less painful. She has significant lower extremity edema. In spite of the edema she can feel vibration from the tuning fork. This gait disorder may be strictly age related and related to degenerative bone changes rather than a CNS manifestation.  2) the patient developed what sounds clearly likely REM behavior disorder about 2 years ago and it has since then progressed. Sometimes she will yell out in her sleep usually periods of acting out occur in the morning  hours or at the end of her sleep., Which correlates to the time when REM sleep is most prevalent. She is not a sleep walker but her dreams sometimes arms upset her. She has started to sleep in a seated position stating that the dreams are not as bad and not as long. And she seems to recover more quickly. With an acting out her dreams she has yelled, thrashed kicked and also has thrown things lays next to her bed to the floor.  3) along with the REM behavior disorder has appeared more frequent daytime hallucinations just over the last year and they have become more and more prevalent. The patient also is nodding off frequently for short periods of time. Her hallucinations are strictly visual and not auditory. Usually she sees children in her home. She has not been suspicious or threatened.  I suspect a syn-nucleopathy, most likely Lewy body dementia. The patient does not have early Parkinson signs there is no tremor, there is no cogwheeling but a diffuse increase in muscle tone she will for example keep her lower arm extent or her leg extended and while I tested her.  Plan:  Treatment plan and additional workup :  Namenda can be used in Lewy body dementia as a medication to make the patient easier to guide and usually reduces the visual hallucinations a little bit. At night 2 medications have been successful in controlling REM behavior disorder, one is in nutritional supplement melatonin usually used at 6 mg or less to be given an hour before intended bedtime, the other is Klonopin or clonazepam a benzodiazepine that pushes REM sleep out into the morning hours and does allows 445 hours of uninterrupted sleep to begin with. This may also help the patient not to be a sleepy in daytime. I would like for her not to nap quite as much in daytime if possible but this will be very difficult to achieve unless the patient can be stimulated many times a day, maybe with little household chores.  No sleep study  needed.  Gait stability training to continue. Rv in 3 month with NP.     Porfirio Mylar Adna Nofziger MD  11/18/2015   CC: Dorothyann Peng, Md [id number] [place], [place] [address]

## 2016-02-24 ENCOUNTER — Ambulatory Visit (INDEPENDENT_AMBULATORY_CARE_PROVIDER_SITE_OTHER): Payer: Medicare Other | Admitting: Adult Health

## 2016-02-24 ENCOUNTER — Encounter: Payer: Self-pay | Admitting: Adult Health

## 2016-02-24 VITALS — BP 160/71 | HR 61 | Resp 16 | Ht 61.0 in | Wt 189.0 lb

## 2016-02-24 DIAGNOSIS — F0280 Dementia in other diseases classified elsewhere without behavioral disturbance: Secondary | ICD-10-CM

## 2016-02-24 DIAGNOSIS — G3183 Dementia with Lewy bodies: Secondary | ICD-10-CM | POA: Diagnosis not present

## 2016-02-24 DIAGNOSIS — G4752 REM sleep behavior disorder: Secondary | ICD-10-CM | POA: Diagnosis not present

## 2016-02-24 NOTE — Progress Notes (Signed)
PATIENT: Connie Poole DOB: 06/04/1921  REASON FOR VISIT: follow up- Louis body dementia, REM sleep behavior disorder HISTORY FROM: patient  HISTORY OF PRESENT ILLNESS: Today 02/24/2016: Connie Poole is a 82 year old female with a history of possible Lewy body dementia and REM sleep behavior disorder. She returns today for follow-up. Her daughter is with her today and reports that they did try Klonopin however they felt that her behavior actually got worse on half a tablet of Klonopin. They never advanced to 1 tablet of Klonopin. The patient is currently taking Restoril. Daughter reports that she sleeps well with this medication and does not report as frequent hallucinations however she wakes up confused with this medication. The patient also sleeps some during the day. When she wakes up she is often confused. This usually resolves within an hour. The patient is no longer able to be home alone. She has family that is with her at all times. The patient uses a Rollator when ambulating. Denies any trouble eating/swallowing. She returns today for an evaluation.  HISTORY 11/18/15 [facility]): Connie MichaelsHelen B Poole is a 82 y.o. female , seen here as a referral from Dr. Allyne GeeSanders for a sleep evaluation. Her daughter is here with her, her mother lives since 2001 in her home.  She was seen on 09-24-2011, Dr Marjory LiesPenumalli .   Chief complaint according to patient : Her daughter reports nightmares.    Connie Poole daughter , Connie Poole , reports that her mother was diagnosed with dementia by Dr. Jake SamplesElliott Lewitt, [place]. He also mentioned that she likely has REM behavior disorder. Connie Poole has noted her mother to have mostly acted out dreams in the early morning hours towards the end of her sleep period - when REM sleep periods were most prevalent. It has now become more prevalent, she has daytime hallucinations of visual nature, she sees people, especially children , that are not there to be seen for  others. she sees children that are not truly there but she does not have auditory hallucinations , she does not hear voices or commands that she has to follow.  Connie Poole has noted that her mother often asked why the children are there, or if what she is going to feed them? She seems to have some doubts about her own perceptions or misperceptions, but she does not feel frightened as this is not nightmarish , she feels not threatened by her visual hallucinations. Visual hallucinations have become more frequent and to some degree they became more detailed. Her daughter has resumed the position that she cannot see her mothers invisible friends or children but she is not debating their existence. Connie Poole has never called the police or the fire department. She has become a little less mobile over the last 2 years and she is now walking with a walker. She continues to have physical therapy twice a week as initiated by Dr. Allyne GeeSanders. Vivid dreams continue as well as visual hallucinations in daytime. She has fallen out of bed, has been found to have the bedside table toppled and the lamp fell on the floor.   Sleep habits are as follows: Connie Poole is described as a night owl- very active at night. Dinner is from 7 to 7.45 Pm, and the family will sit together for a couple of hours. She likes to watch jeopardy with her daughter. She enjoys sleeping in a chair or recliner and mentioned that she feels her dreams don't last as long and don't  become his intentions but she sleeps about position. Connie Poole seeks the company of others and does not enjoy being alone in her room. Once she goes to bed it is usually around midnight. It used to be that she got the most restful sleep and sound sleep between 7 AM and 10 AM. This has changed somewhat. Now she seems to wake up around 7 AM and is usually getting dressed at that time. She goes to the bathroom by herself without any help and usually at night does this once or  twice. She says that she likes to stay awake and actually tries to stay awake at night to avoid the vivid dreams. Her son in law rises at 5.45 and he has often noted her to be awake.  Connie Poole is always afraid of imposing. The daughter has the impression her mother will sleep for about 4-5 hours of sleep.  In daytime she is sleepy as well and nods off. Before the hallucinations started, her family felt comfortable to run an errant and leave her by herself, - but not now. The daytime hallucinations concern her daughter.  She may sleep up to 4 hours in little portions during the daytime. She no longer has a defined day and nighttime cycle.  Sleep medical history and family sleep history: maternal grandmother died at 63 of a stroke, no PD or dementia, but her mother had memory loss. Connie. Poole 4 year younger brother at 36 years of age has dementia.  Social history: lives with daughter since 2001. 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   REVIEW OF SYSTEMS: Out of a complete 14 system review of symptoms, the patient complains only of the following symptoms, and all other reviewed systems are negative.  Memory loss, joint pain, confusion  ALLERGIES: Allergies  Allergen Reactions  . Celebrex [Celecoxib]   . Vioxx [Rofecoxib]     HOME MEDICATIONS: Outpatient Prescriptions Prior to Visit  Medication Sig Dispense Refill  . ACCU-CHEK COMPACT TEST DRUM test strip     . ACCU-CHEK SOFTCLIX LANCETS lancets     . aspirin 81 MG tablet Take 81 mg by mouth daily.    Marland Kitchen atorvastatin (LIPITOR) 40 MG tablet Take 40 mg by mouth daily.    . bimatoprost (LUMIGAN) 0.03 % ophthalmic solution 1 drop at bedtime.    . brimonidine (ALPHAGAN P) 0.1 % SOLN     . Calcium Carbonate-Vitamin D (CALCIUM 600+D PO) Take by mouth.    . carvedilol (COREG) 25 MG tablet     . dorzolamide (TRUSOPT) 2 % ophthalmic solution 1 drop 3 (three) times daily.    . fish oil-omega-3 fatty acids 1000 MG  capsule Take 2 g by mouth daily.    . fluticasone (FLONASE) 50 MCG/ACT nasal spray     . furosemide (LASIX) 40 MG tablet     . KLOR-CON M20 20 MEQ tablet     . loratadine (CLARITIN) 10 MG tablet Take 10 mg by mouth daily.    . Melatonin 1 MG TABS Take by mouth.    . neomycin-bacitracin-polymyxin (POLYSPORIN) ophthalmic ointment     . omeprazole (PRILOSEC) 20 MG capsule Take 20 mg by mouth daily.    . ONGLYZA 5 MG TABS tablet     . sertraline (ZOLOFT) 50 MG tablet Take 50 mg by mouth daily.    . traMADol (ULTRAM) 50 MG tablet     . valsartan (DIOVAN) 160 MG tablet Take 160 mg by mouth daily.    Marland Kitchen  Vitamins A & D (VITAMIN A & D) [address] UNITS TABS Take by mouth.    Marland Kitchen aliskiren (TEKTURNA) 300 MG tablet Take 300 mg by mouth daily.    . clonazePAM (KLONOPIN) 0.5 MG tablet Use one half tablet at bedtime by mouth for 8 days. If tolerated this dose can be increased to the full tablet. 30 tablet 2  . Ginkgo Biloba 40 MG TABS Take by mouth.    . guanFACINE (TENEX) 2 MG tablet     . ibuprofen (ADVIL,MOTRIN) 600 MG tablet Take 1 tablet (600 mg total) by mouth every 6 (six) hours as needed for pain. 20 tablet 0  . Methylfol-Methylcob-Acetylcyst (CEREFOLIN NAC) 6-2-600 MG TABS     . olmesartan (BENICAR) 40 MG tablet Take 40 mg by mouth daily.    . saxagliptin HCl (ONGLYZA) 2.5 MG TABS tablet Take by mouth daily.     No facility-administered medications prior to visit.    PAST MEDICAL HISTORY: Past Medical History  Diagnosis Date  . Diabetes mellitus   . CHF (congestive heart failure) (HCC)   . Glaucoma   . Dementia   . Hypertension   . Hyperlipidemia   . Sarcoidosis (HCC)   . Arthritis     PAST SURGICAL HISTORY: Past Surgical History  Procedure Laterality Date  . Abdominal hysterectomy    . Total knee arthroplasty      FAMILY HISTORY: Family History  Problem Relation Age of Onset  . Stroke Mother   . Heart disease Father   . Cancer Brother     SOCIAL HISTORY: Social History     Social History  . Marital Status: Widowed    Spouse Name: N/A  . Number of Children: N/A  . Years of Education: N/A   Occupational History  . Not on file.   Social History Main Topics  . Smoking status: Never Smoker   . Smokeless tobacco: Not on file  . Alcohol Use: No  . Drug Use: Not on file  . Sexual Activity: Not on file   Other Topics Concern  . Not on file   Social History Narrative      PHYSICAL EXAM  Filed Vitals:   02/24/16 1336  BP: 160/71  Pulse: 61  Resp: 16  Height: 5\' 1"  (1.549 m)  Weight: 189 lb (85.73 kg)   Body mass index is 35.73 kg/(m^2).  Generalized: Well developed, in no acute distress   Neurological examination  Mentation: Alert oriented to time, place, history taking. Follows all commands speech and language fluent Cranial nerve II-XII: Pupils were equal round reactive to light. Extraocular movements were full, visual field were full on confrontational test. Facial sensation and strength were normal. Uvula tongue midline. Head turning and shoulder shrug  were normal and symmetric. Motor: The motor testing reveals 5 over 5 strength of all 4 extremities. Good symmetric motor tone is noted throughout.  Sensory: Sensory testing is intact to soft touch on all 4 extremities. No evidence of extinction is noted.  Coordination: Cerebellar testing reveals good finger-nose-finger and heel-to-shin bilaterally.  Gait and station: Patient uses a Rollator when ambulating. Gait is slightly unsteady. Tandem gait not attempted. Reflexes: Deep tendon reflexes are symmetric and normal bilaterally.   DIAGNOSTIC DATA (LABS, IMAGING, TESTING) - I reviewed patient records, labs, notes, testing and imaging myself where available.   ASSESSMENT AND PLAN 82 y.o. year old female  has a past medical history of Diabetes mellitus; CHF (congestive heart failure) (HCC); Glaucoma; Dementia; Hypertension; Hyperlipidemia; Sarcoidosis (  HCC); and Arthritis. here with:  1.  Sleep behavior disorder 2. Lewy body dementia  The patient will try Klonopin again. Half a tablet at bedtime can increase to one tablet as needed. However if her symptoms worsen we will discontinue this medication. The patient has received some benefit from restoril however she has noticed some confusion in the mornings with this medication. She will follow-up in 3 months or sooner if needed.     Butch Penny, MSN, NP-C 02/24/2016, 1:38 PM [facility] [address] [place], [place] [address] [phone]

## 2016-02-24 NOTE — Patient Instructions (Signed)
Try klonopin 1/2 tablet may increase to 1 tablet Let me know if this is not beneficial If your symptoms worsen or you develop new symptoms please let us know.

## 2016-02-24 NOTE — Progress Notes (Signed)
I agree with the assessment and plan as directed by NP .The patient is known to me .   Kimberla Driskill, MD  

## 2016-04-06 ENCOUNTER — Telehealth: Payer: Self-pay | Admitting: Adult Health

## 2016-04-06 NOTE — Telephone Encounter (Signed)
Dr[facility]ly[facility] Please call [phone]

## 2016-04-06 NOTE — Telephone Encounter (Signed)
I called Dr. Allyne GeeSanders. Asking about starting Aricept for the patient. I am ok with 5 mg daily.

## 2016-05-26 ENCOUNTER — Encounter: Payer: Self-pay | Admitting: Adult Health

## 2016-05-26 ENCOUNTER — Ambulatory Visit (INDEPENDENT_AMBULATORY_CARE_PROVIDER_SITE_OTHER): Payer: Medicare Other | Admitting: Adult Health

## 2016-05-26 ENCOUNTER — Other Ambulatory Visit: Payer: Self-pay | Admitting: Neurology

## 2016-05-26 VITALS — BP 160/62 | HR 63 | Ht 61.0 in | Wt 194.6 lb

## 2016-05-26 DIAGNOSIS — G4752 REM sleep behavior disorder: Secondary | ICD-10-CM | POA: Diagnosis not present

## 2016-05-26 DIAGNOSIS — G3183 Dementia with Lewy bodies: Secondary | ICD-10-CM | POA: Diagnosis not present

## 2016-05-26 DIAGNOSIS — F0280 Dementia in other diseases classified elsewhere without behavioral disturbance: Secondary | ICD-10-CM

## 2016-05-26 NOTE — Patient Instructions (Addendum)
Continue Klonopin 1mg  at bedtime Try to be more after during the day [employer] [id number] If your symptoms worsen or you develop new symptoms please let us know.

## 2016-05-26 NOTE — Progress Notes (Signed)
PATIENT: Connie Poole DOB: 03-02-1921  REASON FOR VISIT: follow up HISTORY FROM: patient  HISTORY OF PRESENT ILLNESS: Today 05/26/2016: Ms. 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 She was instructed to start with a half a tablet and increase to 1 tablet as needed. The family reports that she used it for about 1 month. She states that it works intermittently. reports that she'll have several nights where she sleeps well and other times not. The patient lives with her daughter. she has family that is with her at all times. She does require some assistance with ADLs. She does not operate a motor vehicle. Denies tremor. Some hallucinations throughout the day. Hallucinations are not fearful or balance.the patient is not active during the day. Her daughter reports that she is primarily at home and sleeps throughout the day. she uses a Rollator when ambulating. Denies any falls. The patient was started on Aricept by her primary care provider however the daughter reports that she has not started this medication. She wanted to have this visit before initiating this medication.Returns today for an evaluation.  HISTORY  02/24/2016: Mrs Connie Poole is a 82 year old female with a history of possible Lewy body dementia and REM sleep behavior disorder. She returns today for follow-up. Her daughter is with her today and reports that they did try Klonopin however they felt that her behavior actually got worse on half a tablet of Klonopin. They never advanced to 1 tablet of Klonopin. The patient is currently taking Restoril. Daughter reports that she sleeps well with this medication and does not report as frequent hallucinations however she wakes up confused with this medication. The patient also sleeps some during the day. When she wakes up she is often  confused. This usually resolves within an hour. The patient is no longer able to be home alone. She has family that is with her at all times. The patient uses a Rollator when ambulating. Denies any trouble eating/swallowing. She returns today for an evaluation.  HISTORY 11/18/15 [facility]): Connie MichaelsHelen B Shed is a 82 y.o. female , seen here as a referral from Dr. Allyne GeeSanders for a sleep evaluation. Her daughter is here with her, her mother lives since 2001 in her home.  She was seen on 09-24-2011, Dr Marjory LiesPenumalli .   Chief complaint according to patient : Her daughter reports nightmares.    [name] daughter , Dois DavenportSandra , reports that her mother was diagnosed with dementia by Dr. Jake SamplesElliott Lewitt, a neurologist [place] in [place]. He also mentioned that she likely has REM behavior disorder. Dois DavenportSandra has noted her mother to have mostly acted out dreams in the early morning hours towards the end of her sleep period - when REM sleep periods were most prevalent. It has now become more prevalent, she has daytime hallucinations of visual nature, she sees people, especially children , that are not there to be seen for others. she sees children that are not truly there but she does not have auditory hallucinations , she does not hear voices or commands that she has to follow.  [name] has noted that her mother often asked why the children are there, or if what she is going to feed them? She seems to have some doubts about her own perceptions or misperceptions, but she does not feel frightened as this is not  nightmarish , she feels not threatened by her visual hallucinations. Visual hallucinations have become more frequent and to some degree they became more detailed. Her daughter has resumed the position that she cannot see her mothers invisible friends or children but she is not debating their existence. Mrs. Knarr has never called the police or the fire department. She has become a little less mobile over the last 2  years and she is now walking with a walker. She continues to have physical therapy twice a week as initiated by Dr. Allyne Gee. Vivid dreams continue as well as visual hallucinations in daytime. She has fallen out of bed, has been found to have the bedside table toppled and the lamp fell on the floor.   Sleep habits are as follows: Mrs. Petrides is described as a night owl- very active at night. Dinner is from 7 to 7.45 Pm, and the family will sit together for a couple of hours. She likes to watch jeopardy with her daughter. She enjoys sleeping in a chair or recliner and mentioned that she feels her dreams don't last as long and don't become his intentions but she sleeps about position. Mrs. Jernberg seeks the company of others and does not enjoy being alone in her room. Once she goes to bed it is usually around midnight. It used to be that she got the most restful sleep and sound sleep between 7 AM and 10 AM. This has changed somewhat. Now she seems to wake up around 7 AM and is usually getting dressed at that time. She goes to the bathroom by herself without any help and usually at night does this once or twice. She says that she likes to stay awake and actually tries to stay awake at night to avoid the vivid dreams. Her son in law rises at 5.45 and he has often noted her to be awake.  Mrs. Galli is always afraid of imposing. The daughter has the impression her mother will sleep for about 4-5 hours of sleep.  In daytime she is sleepy as well and nods off. Before the hallucinations started, her family felt comfortable to run an errant and leave her by herself, - but not now. The daytime hallucinations concern her daughter.  She may sleep up to 4 hours in little portions during the daytime. She no longer has a defined day and nighttime cycle.  Sleep medical history and family sleep history: maternal grandmother died at 29 of a stroke, no PD or dementia, but her mother had memory loss. Mrs. Simer 4 year  younger brother at 82 years of age has dementia.  Social history: lives with daughter since 2001. 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    REVIEW OF SYSTEMS: Out of a complete 14 system review of symptoms, the patient complains only of the following symptoms, and all other reviewed systems are negative.  Blurred vision, hearing loss, leg swelling, insomnia, daytime sleepiness, acting out dreams, joint pain, confusion, nervous/anxious, hallucinations, memory loss  ALLERGIES: Allergies  Allergen Reactions  . Celebrex [Celecoxib]   . Vioxx [Rofecoxib]     HOME MEDICATIONS: Outpatient Medications Prior to Visit  Medication Sig Dispense Refill  . ACCU-CHEK COMPACT TEST DRUM test strip     . ACCU-CHEK SOFTCLIX LANCETS lancets     . aspirin 81 MG tablet Take 81 mg by mouth daily.    Marland Kitchen atorvastatin (LIPITOR) 40 MG tablet Take 40 mg by mouth daily.    . bimatoprost (LUMIGAN) 0.03 %  ophthalmic solution 1 drop at bedtime.    . brimonidine (ALPHAGAN P) 0.1 % SOLN     . Calcium Carbonate-Vitamin D (CALCIUM 600+D PO) Take by mouth.    . carvedilol (COREG) 25 MG tablet     . dorzolamide (TRUSOPT) 2 % ophthalmic solution 1 drop 3 (three) times daily.    . fish oil-omega-3 fatty acids 1000 MG capsule Take 2 g by mouth daily.    . fluticasone (FLONASE) 50 MCG/ACT nasal spray     . furosemide (LASIX) 40 MG tablet     . KLOR-CON M20 20 MEQ tablet     . loratadine (CLARITIN) 10 MG tablet Take 10 mg by mouth daily.    . Melatonin 1 MG TABS Take by mouth.    . neomycin-bacitracin-polymyxin (POLYSPORIN) ophthalmic ointment     . omeprazole (PRILOSEC) 20 MG capsule Take 20 mg by mouth daily.    . ONGLYZA 5 MG TABS tablet     . sertraline (ZOLOFT) 50 MG tablet Take 50 mg by mouth daily.    . traMADol (ULTRAM) 50 MG tablet     . valsartan (DIOVAN) 160 MG tablet Take 160 mg by mouth daily.    . Vitamins A & D (VITAMIN A & D) [address] UNITS TABS Take by mouth.     No  facility-administered medications prior to visit.     PAST MEDICAL HISTORY: Past Medical History:  Diagnosis Date  . Arthritis   . CHF (congestive heart failure) (HCC)   . Dementia   . Diabetes mellitus   . Glaucoma   . Hyperlipidemia   . Hypertension   . Sarcoidosis (HCC)     PAST SURGICAL HISTORY: Past Surgical History:  Procedure Laterality Date  . ABDOMINAL HYSTERECTOMY    . TOTAL KNEE ARTHROPLASTY      FAMILY HISTORY: Family History  Problem Relation Age of Onset  . Stroke Mother   . Heart disease Father   . Cancer Brother     SOCIAL HISTORY: Social History   Social History  . Marital status: Widowed    Spouse name: N/A  . Number of children: N/A  . Years of education: N/A   Occupational History  . Not on file.   Social History Main Topics  . Smoking status: Never Smoker  . Smokeless tobacco: Not on file  . Alcohol use No  . Drug use: Unknown  . Sexual activity: Not on file   Other Topics Concern  . Not on file   Social History Narrative  . No narrative on file      PHYSICAL EXAM  Vitals:   05/26/16 1436  BP: (!) 160/62  Pulse: 63  Weight: 194 lb 9.6 oz (88.3 kg)  Height: 5\' 1"  (1.549 m)   Body mass index is 36.77 kg/m.   MMSE - Mini Mental State Exam 05/26/2016 11/18/2015  Orientation to time 4 4  Orientation to Place 5 3  Registration 3 2  Attention/ Calculation 5 5  Recall 2 3  Language- name 2 objects 1 2  Language- repeat 1 0  Language- follow 3 step command 2 3  Language- read & follow direction 1 1  Write a sentence 1 1  Copy design 0 0  Total score 25 24     Generalized: Well developed, in no acute distress   Neurological examination  Mentation: Alert. Follows all commands speech and language fluent Cranial nerve II-XII: Pupils were equal round reactive to light. Extraocular movements were full, visual  field were full on confrontational test. Facial sensation and strength were normal. Uvula tongue midline. Head  turning and shoulder shrug  were normal and symmetric. Motor: The motor testing reveals 5 over 5 strength of all 4 extremities. Good symmetric motor tone is noted throughout.  Sensory: Sensory testing is intact to soft touch on all 4 extremities. No evidence of extinction is noted.  Coordination: Cerebellar testing reveals good finger-nose-finger and heel-to-shin bilaterally.  Gait and station: patient uses a Rollator when ambulating. Gait is slightly unsteady. Tandem gait not attempted. Reflexes: Deep tendon reflexes are symmetric and normal bilaterally.   DIAGNOSTIC DATA (LABS, IMAGING, TESTING) - I reviewed patient records, labs, notes, testing and imaging myself where available.     ASSESSMENT AND PLAN 82 y.o. year old female  has a past medical history of Arthritis; CHF (congestive heart failure) (HCC); Dementia; Diabetes mellitus; Glaucoma; Hyperlipidemia; Hypertension; and Sarcoidosis (HCC). here with:  1.Lewy body dementia  Overall the patient has remained stable. Her memory score is stable. She will start Aricept 5 mg at bedtime. She will continue using Klonopin 1 tablet at bedtime as needed.We did discuss antipsychotics and I reviewed the risk associated with these medications and Lewy body dementia. The daughter does not want to initiate any of these medications. If her symptoms worsen or she develops any new symptoms she will let us know. She will follow-up in 6 months with Dr. Vickey Huger  I spent 25 minutes with the patient 50% of this time was spent counseling the patient on treatment options.     Butch Penny, MSN, NP-C 05/26/2016, 2:37 PM [facility] [address] [place], [place] [address] [phone]

## 2016-05-26 NOTE — Progress Notes (Signed)
I agree with the assessment and plan as directed by NP .The patient is known to me .   Gid Schoffstall, MD  

## 2016-05-27 NOTE — Telephone Encounter (Signed)
RX for klonopin faxed to [facility]. Received a receipt of confirmation.  

## 2016-11-23 ENCOUNTER — Telehealth: Payer: Self-pay | Admitting: Neurology

## 2016-11-23 NOTE — Telephone Encounter (Signed)
error 

## 2016-11-24 ENCOUNTER — Ambulatory Visit: Payer: Medicare Other | Admitting: Neurology

## 2017-01-05 ENCOUNTER — Ambulatory Visit (INDEPENDENT_AMBULATORY_CARE_PROVIDER_SITE_OTHER): Payer: Medicare Other | Admitting: Neurology

## 2017-01-05 ENCOUNTER — Encounter (INDEPENDENT_AMBULATORY_CARE_PROVIDER_SITE_OTHER): Payer: Self-pay

## 2017-01-05 ENCOUNTER — Encounter: Payer: Self-pay | Admitting: Neurology

## 2017-01-05 VITALS — BP 145/59 | HR 58

## 2017-01-05 DIAGNOSIS — F0280 Dementia in other diseases classified elsewhere without behavioral disturbance: Secondary | ICD-10-CM | POA: Diagnosis not present

## 2017-01-05 DIAGNOSIS — H5316 Psychophysical visual disturbances: Secondary | ICD-10-CM | POA: Diagnosis not present

## 2017-01-05 DIAGNOSIS — R441 Visual hallucinations: Secondary | ICD-10-CM

## 2017-01-05 DIAGNOSIS — G3183 Dementia with Lewy bodies: Secondary | ICD-10-CM | POA: Diagnosis not present

## 2017-01-05 MED ORDER — CLONAZEPAM 0.5 MG PO TABS: ORAL_TABLET | ORAL | 2 refills | Status: DC

## 2017-01-05 NOTE — Progress Notes (Signed)
PATIENT: Connie Poole DOB: 1921-03-04  REASON FOR VISIT: follow up HISTORY FROM: patient  HISTORY OF PRESENT ILLNESS:  Interval history from 01/05/2017, as the pleasure of seeing Connie Poole today with  Connie Poole, her daughter. The patient uses a Rollator and a transport chair. Hallucinations and delusions have continued and are the hallmark of Lewy body dementia. Her memory has not necessarily declined but she has great day to day variation in memory. She stays on her current medication, managed by her daughter.    05/26/2016: Connie Poole is a 83 year old female with a history of possibly Lewy body dementia in dream sleep behavior disorder 3 she returns today for follow-up.at the last visit the patient wanted to time try Klonopin again. She was instructed to start with a half a tablet and increase to 1 tablet as needed. The family reports that she used it for about 1 month. She states that it works intermittently. reports that she'll have several nights where she sleeps well and other times not. The patient lives with her daughter. she has family that is with her at all times. She does require some assistance with ADLs. She does not operate a motor vehicle. Denies tremor. Some hallucinations throughout the day. Hallucinations are not fearful- not active during the day.  Her daughter reports that she is primarily at home and sleeps throughout the day. she uses a Rollator when ambulating. Denies any falls. The patient was started on Aricept by her primary care provider however the daughter reports that she has not started this medication. She wanted to have this visit before initiating this medication.Returns today for an evaluation.  HISTORY  02/24/2016: Connie Poole is a 83 year old female with a history of possible Lewy body dementia and REM sleep behavior disorder. She returns today for follow-up. Her daughter is with her today and reports that they did try Klonopin however they felt that her behavior  actually got worse on half a tablet of Klonopin. They never advanced to 1 tablet of Klonopin. The patient is currently taking Restoril. Daughter reports that she sleeps well with this medication and does not report as frequent hallucinations however she wakes up confused with this medication. The patient also sleeps some during the day. When she wakes up she is often confused. This usually resolves within an hour. The patient is no longer able to be home alone. She has family that is with her at all times. The patient uses a Rollator when ambulating. Denies any trouble eating/swallowing. She returns today for an evaluation.  HISTORY 11/18/15 [facility]): Connie Poole is a 83 y.o. female , seen here as a referral from Dr. Allyne Gee for a sleep evaluation. Her daughter is here with her, her mother lives since 2001 in her home.  She was seen on 09-24-2011, Dr Marjory Lies .   Chief complaint according to patient : Her daughter reports nightmares.  Connie Poole daughter , Connie Poole , reports that her mother was diagnosed with dementia by Dr. Jake Samples, a neurologist  in [place]. He also mentioned that she likely has REM behavior disorder. Connie Poole has noted her mother to have mostly acted out dreams in the early morning hours towards the end of her sleep period - when REM sleep periods were most prevalent. It has now become more prevalent, she has daytime hallucinations of visual nature, she sees people, especially children , that are not there to be seen for others. she sees children that are not truly there but she does  not have auditory hallucinations , she does not hear voices or commands that she has to follow.  Connie Poole has noted that her mother often asked why the children are there, or if what she is going to feed them? She seems to have some doubts about her own perceptions or misperceptions, but she does not feel frightened as this is not nightmarish , she feels not threatened by her visual  hallucinations. Visual hallucinations have become more frequent and to some degree they became more detailed. Her daughter has resumed the position that she cannot see her mothers invisible friends or children but she is not debating their existence. Connie Poole has never called the police or the fire department. She has become a little less mobile over the last 2 years and she is now walking with a walker. She continues to have physical therapy twice a week as initiated by Dr. Allyne Gee. Vivid dreams continue as well as visual hallucinations in daytime. She has fallen out of bed, has been found to have the bedside table toppled and the lamp fell on the floor.   Sleep habits are as follows: Connie Poole is described as a night owl- very active at night. Dinner is from 7 to 7.45 Pm, and the family will sit together for a couple of hours. She likes to watch jeopardy with her daughter. She enjoys sleeping in a chair or recliner and mentioned that she feels her dreams don't last as long and don't become his intentions but she sleeps about position. Connie Poole seeks the company of others and does not enjoy being alone in her room. Once she goes to bed it is usually around midnight. It used to be that she got the most restful sleep and sound sleep between 7 AM and 10 AM. This has changed somewhat. Now she seems to wake up around 7 AM and is usually getting dressed at that time. She goes to the bathroom by herself without any help and usually at night does this once or twice. She says that she likes to stay awake and actually tries to stay awake at night to avoid the vivid dreams. Her son in law rises at 5.45 and he has often noted her to be awake.  Connie Poole is always afraid of imposing. The daughter has the impression her mother will sleep for about 4-5 hours of sleep.  In daytime she is sleepy as well and nods off. Before the hallucinations started, her family felt comfortable to run an errant and leave her by  herself, - but not now. The daytime hallucinations concern her daughter.  She may sleep up to 4 hours in little portions during the daytime. She no longer has a defined day and nighttime cycle. Sleep medical history and family sleep history: maternal grandmother died at 2 of a stroke, no PD or dementia, but her mother had memory loss. Connie. Pecha 4 year younger brother at 65 years of age has dementia. Social history: lives with daughter since 2001. 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    REVIEW OF SYSTEMS: Out of a complete 14 system review of symptoms, the patient complains only of the following symptoms, and all other reviewed systems are negative.  Blurred vision, hearing loss, leg swelling, insomnia, daytime sleepiness, acting out dreams, joint pain, confusion, nervous/anxious, hallucinations, memory loss  ALLERGIES: Allergies  Allergen Reactions  . Celebrex [Celecoxib]   . Vioxx [Rofecoxib]     HOME MEDICATIONS: Outpatient Medications Prior  to Visit  Medication Sig Dispense Refill  . ACCU-CHEK COMPACT TEST DRUM test strip     . ACCU-CHEK SOFTCLIX LANCETS lancets     . aspirin 81 MG tablet Take 81 mg by mouth daily.    Marland Kitchen atorvastatin (LIPITOR) 40 MG tablet Take 40 mg by mouth daily.    . bimatoprost (LUMIGAN) 0.03 % ophthalmic solution 1 drop at bedtime.    . brimonidine (ALPHAGAN P) 0.1 % SOLN     . Calcium Carbonate-Vitamin D (CALCIUM 600+D PO) Take by mouth.    . carvedilol (COREG) 25 MG tablet     . clonazePAM (KLONOPIN) 0.5 MG tablet TAKE 1/2 TABLET BY MOUTH AT BEDTIME FOR 8 DAYS . IF TOLERATED DOSE CAN BE INCREASED TO THE FULL TAB 30 tablet 2  . dorzolamide (TRUSOPT) 2 % ophthalmic solution 1 drop 3 (three) times daily.    . fish oil-omega-3 fatty acids 1000 MG capsule Take 2 g by mouth daily.    . fluticasone (FLONASE) 50 MCG/ACT nasal spray     . furosemide (LASIX) 40 MG tablet     . KLOR-CON M20 20 MEQ tablet     . loratadine (CLARITIN) 10  MG tablet Take 10 mg by mouth daily.    . ONGLYZA 5 MG TABS tablet     . sertraline (ZOLOFT) 50 MG tablet Take 50 mg by mouth daily.    . traMADol (ULTRAM) 50 MG tablet     . valsartan (DIOVAN) 160 MG tablet Take 160 mg by mouth daily.    . Vitamins A & D (VITAMIN A & D) [address] UNITS TABS Take by mouth.    . neomycin-bacitracin-polymyxin (POLYSPORIN) ophthalmic ointment      No facility-administered medications prior to visit.     PAST MEDICAL HISTORY: Past Medical History:  Diagnosis Date  . Arthritis   . CHF (congestive heart failure) (HCC)   . Dementia   . Diabetes mellitus   . Glaucoma   . Hyperlipidemia   . Hypertension   . Sarcoidosis     PAST SURGICAL HISTORY: Past Surgical History:  Procedure Laterality Date  . ABDOMINAL HYSTERECTOMY    . TOTAL KNEE ARTHROPLASTY      FAMILY HISTORY: Family History  Problem Relation Age of Onset  . Stroke Mother   . Heart disease Father   . Cancer Brother     SOCIAL HISTORY: Social History   Social History  . Marital status: Widowed    Spouse name: N/A  . Number of children: N/A  . Years of education: N/A   Occupational History  . Not on file.   Social History Main Topics  . Smoking status: Never Smoker  . Smokeless tobacco: Never Used  . Alcohol use No  . Drug use: Unknown  . Sexual activity: Not on file   Other Topics Concern  . Not on file   Social History Narrative  . No narrative on file      PHYSICAL EXAM  Vitals:   01/05/17 1415  BP: (!) 145/59  Pulse: (!) 58   There is no height or weight on file to calculate BMI.     Generalized: Well developed, in no acute distress , well groomed. Hard of hearing.   Ankle edema bilaterally.   Neurological examination   MMSE - Mini Mental State Exam 01/05/2017 05/26/2016 11/18/2015  Orientation to time 5 4 4   Orientation to Place 3 5 3   Registration 3 3 2   Attention/ Calculation 5 5 5  Recall 3 2 3   Language- name 2 objects 2 1 2   Language-  repeat 0 1 0  Language- follow 3 step command 2 2 3   Language- read & follow direction 1 1 1   Write a sentence 1 1 1   Copy design 0 0 0  Total score 25 25 24     Mentation: Alert. Follows all commands speech and language fluent Cranial nerve : "food tastes good", no loss of smell reported.    Pupils were equal round reactive to light. Extraocular movements were full, visual field were full on confrontational test. Facial sensation and strength were normal. Uvula tongue midline. Head turning and shoulder shrug  were normal and symmetric. Motor:  Full strength of upper extremities. symmetric motor tone is noted throughout.  Sensory: Sensory testing is intact to soft touch on all 4 extremities. No evidence of extinction is noted.  Coordination: Cerebellar testing reveals good finger-nose-finger and heel-to-shin bilaterally.  Gait and station: patient uses a Rollator when ambulating. Gait is slightly unsteady. Tandem gait not attempted. Reflexes: Deep tendon reflexes are symmetric and normal bilaterally.   DIAGNOSTIC DATA (LABS, IMAGING, TESTING) - I reviewed patient records, labs, notes, testing and imaging myself where available.    ASSESSMENT AND PLAN 83 y.o. year old female  has a past medical history of Arthritis; CHF (congestive heart failure) (HCC); Dementia; Diabetes mellitus; Glaucoma; Hyperlipidemia; Hypertension; and Sarcoidosis. here with:  1.Lewy body dementia-  Suspected - but she does not report loss of smell, has minimal  rigidity. No cogwheeling.  Hallucinations are present when the patient arises from sleep, even if it was a short nap. Seeing people.   Overall the patient has remained stable. Her memory score is stable.  She will start Aricept 5 mg at bedtime. She  continues using Klonopin 1 tablet low dose at bedtime-  as needed. We have discussed antipsychotics and have reviewed the risk associated with these medications and Lewy body dementia, such  as stroke risk.    The daughter does still not  Feel it is time  to initiate any of these medications. If her symptoms worsen or she develops any new symptoms she will let us know.   She will follow-up in 6 months with NP or me.  I spent 25 minutes with the patient 50% of this time was spent counseling the patient on treatment options.     Hatice Bubel, MD  01/05/2017, 2:36 PM [facility] [address], [address] [place], [place] [id number] [phone]

## 2017-01-05 NOTE — Progress Notes (Signed)
Pt and pt's daughter agreeable to having research dept review pt's chart to find out if pt qualifies for any clinical trials. Consent signed by pt. Will bring to research.

## 2017-01-29 ENCOUNTER — Other Ambulatory Visit: Payer: Self-pay | Admitting: Neurology

## 2017-01-31 ENCOUNTER — Other Ambulatory Visit: Payer: Self-pay | Admitting: Neurology

## 2017-01-31 NOTE — Telephone Encounter (Signed)
Pt was most likely given the RX at last office visit. She should check her papers to make sure that she doesn't have it. I called pt to explain this. No answer. LVM for pt to call me back.

## 2017-02-01 NOTE — Telephone Encounter (Signed)
Pt daughter called back to say she will look through all papers and if she can not find it will call back to inform

## 2017-02-02 NOTE — Telephone Encounter (Signed)
Called daughter who said that she did find paper rx and it was taken to pharmacy. Voiced appreciation.

## 2017-03-24 ENCOUNTER — Telehealth: Payer: Self-pay | Admitting: Neurology

## 2017-03-24 NOTE — Telephone Encounter (Signed)
Pt's last prescription was written in may for only clonazepam 0.5mg  ordered give 1/2 tab at bedtime. Per conversation and pt last office visit the patient was taken up to 1 tab at bedtime per Dr Oliva Bustardohmeier's instructions. Due to the fact the script was made out like it should last 60 days and will only work for 30 days. So I told pt's daughter to call me when closer for a refill and we will fix the prescription to help make sure it is for correct dosage and amount prescribed. Pt's daughter verbalized understanding.

## 2017-03-24 NOTE — Telephone Encounter (Signed)
Pt's daughter called the office she has been giving pt clonazePAM (KLONOPIN) 0.5 MG tablet 1 tab at bedtime. Current directions read 1/2 tab at bedtime. Please send refill with clarification of directions  to [facility]

## 2017-04-07 ENCOUNTER — Other Ambulatory Visit: Payer: Self-pay | Admitting: Neurology

## 2017-04-07 MED ORDER — CLONAZEPAM 0.5 MG PO TABS: ORAL_TABLET | ORAL | 2 refills | Status: DC

## 2017-04-07 NOTE — Telephone Encounter (Signed)
Called pt back cause I was wanting to confirm that she was infact ready for me to send the new script in and wanted to make her aware that the medication's lowest dose comes in the 0.5mg  strength. So the new script will states Clonazepam 0.5mg  pt takes 1/2 tab at bedtime.  No answer at this time and asked for the daughter to return call to confirm

## 2017-04-07 NOTE — Telephone Encounter (Signed)
Patients [name] (listed on DPR) called office in reference to clonazePAM (KLONOPIN) 0.5 MG tablet.  Daughter stated she patient is only taking 1/2 at bedtime she said she did not increase the medication to 1 tablet due to making patient groggy, not as alert.  She wants her mother to sleep but not to feel groggy or not alert the next morning.  Dois Davenport said for the refill she would like to make sure she has the correct quanitity if patient were to take 1/2 tablet or 1 tablet and not run out.  Pharmacy- [facility] [address]  Please call

## 2017-04-07 NOTE — Telephone Encounter (Signed)
Called and discussed with patient and will send medication to pharmacy.

## 2017-04-07 NOTE — Telephone Encounter (Signed)
Pt daughter(on DPR ) has called back to confirm that yes she does want the refill for pt and would also like a call back to discuss another matter

## 2017-07-20 ENCOUNTER — Ambulatory Visit: Payer: Medicare Other | Admitting: Adult Health

## 2017-07-20 ENCOUNTER — Encounter: Payer: Self-pay | Admitting: Adult Health

## 2017-07-20 VITALS — Ht 61.0 in

## 2017-07-20 DIAGNOSIS — F0280 Dementia in other diseases classified elsewhere without behavioral disturbance: Secondary | ICD-10-CM

## 2017-07-20 DIAGNOSIS — G3183 Dementia with Lewy bodies: Secondary | ICD-10-CM | POA: Diagnosis not present

## 2017-07-20 NOTE — Progress Notes (Signed)
PATIENT: Connie Poole DOB: Jan 05, 1921  REASON FOR VISIT: follow up HISTORY FROM: patient  HISTORY OF PRESENT ILLNESS: Connie Poole is a 83 year old female with a history of suspected Lewy body dementia.  She returns today for follow-up.  The patient is on Aricept and tolerating it well.  She continues to take Klonopin before bedtime.  Her daughter is with her today.  She reports overall she feels that she is remained stable.  She is able to complete her ADLs with minimal assistance.  Her daughter prepares her food and medication.  She reports that her mother has never been a good sleeper.  She states that some nights are better than others.  She reports that she tends to have hallucinations when she is waking up from sleep or a nap.  Otherwise she does not notice any hallucination during the day.  Patient returns today for an evaluation.  HISTORY 05/26/2016: Connie Poole is a 83 year old female with a history of possibly Lewy body dementia and REM sleep behavior disorder. she returns today for follow-up.at the last visit the patient wanted to time try Klonopin again. She was instructed to start with a half a tablet and increase to 1 tablet as needed. The family reports that she used it for about 1 month. She states that it works intermittently. reports that she'll have several nights where she sleeps well and other times not. The patient lives with her daughter. she has family that is with her at all times. She does require some assistance with ADLs. She does not operate a motor vehicle. Denies tremor. Some hallucinations throughout the day. Hallucinations are not fearful or balance.the patient is not active during the day. Her daughter reports that she is primarily at home and sleeps throughout the day. she uses a Rollator when ambulating. Denies any falls. The patient was started on Aricept by her primary care provider however the daughter reports that she has not started this medication. She wanted to  have this visit before initiating this medication.Returns today for an evaluation.  REVIEW OF SYSTEMS: Out of a complete 14 system review of symptoms, the patient complains only of the following symptoms, and all other reviewed systems are negative.  Joint pain, joint swelling, walking difficulty, nervous/anxious, hallucinations, memory loss acting out dreams, cold intolerance  ALLERGIES: Allergies  Allergen Reactions  . Celebrex [Celecoxib]   . Vioxx [Rofecoxib]     HOME MEDICATIONS: Outpatient Medications Prior to Visit  Medication Sig Dispense Refill  . ACCU-CHEK COMPACT TEST DRUM test strip     . ACCU-CHEK SOFTCLIX LANCETS lancets ONE STEP    . aspirin 81 MG tablet Take 81 mg by mouth daily.    Marland Kitchen. atorvastatin (LIPITOR) 40 MG tablet Take 40 mg by mouth daily.    . bimatoprost (LUMIGAN) 0.03 % ophthalmic solution 1 drop at bedtime.    . brimonidine (ALPHAGAN P) 0.1 % SOLN     . Calcium Carbonate-Vitamin D (CALCIUM 600+D PO) Take by mouth.    . carvedilol (COREG) 25 MG tablet     . clonazePAM (KLONOPIN) 0.5 MG tablet TAKE 1/2 tablet -1 tablet BY MOUTH AT BEDTIME AS NEEDED 30 tablet 2  . dorzolamide (TRUSOPT) 2 % ophthalmic solution 1 drop 3 (three) times daily.    . fish oil-omega-3 fatty acids 1000 MG capsule Take 2 g by mouth daily.    . fluticasone (FLONASE) 50 MCG/ACT nasal spray     . furosemide (LASIX) 40 MG tablet     .  KLOR-CON M20 20 MEQ tablet     . loratadine (CLARITIN) 10 MG tablet Take 10 mg by mouth daily.    . sertraline (ZOLOFT) 50 MG tablet Take 50 mg by mouth daily.    Marland Kitchen. telmisartan (MICARDIS) 80 MG tablet Take 80 mg by mouth daily.    . traMADol (ULTRAM) 50 MG tablet     . Vitamins A & D (VITAMIN A & D) [address]-60[address]25000-1000 UNITS TABS Take by mouth.    . ONGLYZA 5 MG TABS tablet     . valsartan (DIOVAN) 160 MG tablet Take 160 mg by mouth daily.     No facility-administered medications prior to visit.     PAST MEDICAL HISTORY: Past Medical History:  Diagnosis Date    . Arthritis   . CHF (congestive heart failure) (HCC)   . Dementia   . Diabetes mellitus   . Glaucoma   . Hyperlipidemia   . Hypertension   . Sarcoidosis     PAST SURGICAL HISTORY: Past Surgical History:  Procedure Laterality Date  . ABDOMINAL HYSTERECTOMY    . TOTAL KNEE ARTHROPLASTY      FAMILY HISTORY: Family History  Problem Relation Age of Onset  . Stroke Mother   . Heart disease Father   . Cancer Brother     SOCIAL HISTORY: Social History   Socioeconomic History  . Marital status: Widowed    Spouse name: Not on file  . Number of children: Not on file  . Years of education: Not on file  . Highest education level: Not on file  Social Needs  . Financial resource strain: Not on file  . Food insecurity - worry: Not on file  . Food insecurity - inability: Not on file  . Transportation needs - medical: Not on file  . Transportation needs - non-medical: Not on file  Occupational History  . Not on file  Tobacco Use  . Smoking status: Never Smoker  . Smokeless tobacco: Never Used  Substance and Sexual Activity  . Alcohol use: No    Alcohol/week: 0.0 oz  . Drug use: Not on file  . Sexual activity: Not on file  Other Topics Concern  . Not on file  Social History Narrative  . Not on file      PHYSICAL EXAM  Vitals:   07/20/17 1447  Height: 5\' 1"  (1.549 m)   Body mass index is 36.77 kg/m.   MMSE - Mini Mental State Exam 07/20/2017 01/05/2017 05/26/2016  Orientation to time 5 5 4   Orientation to Place 4 3 5   Registration 3 3 3   Attention/ Calculation 5 5 5   Recall 1 3 2   Language- name 2 objects 2 2 1   Language- repeat 1 0 1  Language- follow 3 step command 1 2 2   Language- read & follow direction 1 1 1   Write a sentence 1 1 1   Copy design 0 0 0  Total score 24 25 25      Generalized: Well developed, in no acute distress   Neurological examination  Mentation: Alert. Follows all commands speech and language fluent Cranial nerve II-XII: Pupils  were equal round reactive to light. Extraocular movements were full, visual field were full on confrontational test. Facial sensation and strength were normal. Uvula tongue midline. Head turning and shoulder shrug  were normal and symmetric. Motor: The motor testing reveals 5 over 5 strength of all 4 extremities. Good symmetric motor tone is noted throughout.  Sensory: Sensory testing is intact to soft  touch on all 4 extremities. No evidence of extinction is noted.  Coordination: Cerebellar testing reveals good finger-nose-finger and heel-to-shin bilaterally.  Gait and station: Patient is in a wheelchair. Reflexes: Deep tendon reflexes are symmetric and normal bilaterally.   DIAGNOSTIC DATA (LABS, IMAGING, TESTING) - I reviewed patient records, labs, notes, testing and imaging myself where available.  Lab Results  Component Value Date   WBC 4.6 08/11/2012   HGB 10.8 (L) 08/11/2012   HCT 33.6 (L) 08/11/2012   MCV 87.5 08/11/2012   PLT 294 08/11/2012      Component Value Date/Time   NA 139 08/11/2012 1427   K 4.0 08/11/2012 1427   CL 100 08/11/2012 1427   CO2 28 08/11/2012 1427   GLUCOSE 160 (H) 08/11/2012 1427   BUN 18 08/11/2012 1427   CREATININE 0.90 08/11/2012 1427   CALCIUM 9.5 08/11/2012 1427   PROT 6.9 08/11/2012 1427   ALBUMIN 3.6 08/11/2012 1427   AST 18 08/11/2012 1427   ALT 18 08/11/2012 1427   ALKPHOS 73 08/11/2012 1427   BILITOT 0.3 08/11/2012 1427   GFRNONAA 55 (L) 08/11/2012 1427   GFRAA 64 (L) 08/11/2012 1427      ASSESSMENT AND PLAN 83 y.o. year old female  has a past medical history of Arthritis, CHF (congestive heart failure) (HCC), Dementia, Diabetes mellitus, Glaucoma, Hyperlipidemia, Hypertension, and Sarcoidosis. here with:  1.  Lewy body dementia  The patient's memory score has remained stable.  The patient will continue on Aricept 5 mg daily prescribed by her primary care provider.  She will continue on Klonopin 1 mg before bedtime.  I have advised  the patient and her daughter that if her symptoms worsen or she develops new symptoms they should let us know.  She will follow-up in 6 months or sooner if needed.  I spent 15 minutes with the patient. 50% of this time was spent reviewing her memory score.  Butch Penny, MSN, NP-C 07/20/2017, 3:14 PM [facility] [address], [address] [place], [place] [address] [phone]

## 2017-07-20 NOTE — Patient Instructions (Signed)
Your Plan:  Continue Aricept and Klonopin If your symptoms worsen or you develop new symptoms please let us know.   [facility] you [facility]. I hope we have been able to provide you high quality care today.  You may receive a patient satisfaction survey over the next few weeks. We would appreciate your feedback and comments so that we may continue to improve ourselves and the health of our patients.

## 2017-07-22 NOTE — Progress Notes (Signed)
I have read the note, and I agree with the clinical assessment and plan.  Azan Maneri A. Rainn Zupko, MD, PhD, FAAN Certified in Neurology, Clinical Neurophysiology, Sleep Medicine, Pain Medicine and Neuroimaging  [facility] [address], [address] [place], [place] [address] [phone]  

## 2017-08-05 ENCOUNTER — Other Ambulatory Visit: Payer: Self-pay

## 2017-08-05 NOTE — Patient Outreach (Signed)
[facility]) Care Management  08/05/2017  Connie Poole 1921/01/31 [id number]   Medication Adherence call to Connie Poole patient is showing past due under [facility] [employer]on [place] 2.5 spoke with patients daughter she said Connie Poole is no longer taking this medication at all [place]she said she is doing well with out the medication.   Connie Poole CPhT Pharmacy Technician [facility] Care Management Direct Dial [phone]  Fax [phone] [email or web address]

## 2017-09-02 ENCOUNTER — Other Ambulatory Visit: Payer: Self-pay | Admitting: Internal Medicine

## 2017-09-02 ENCOUNTER — Ambulatory Visit
Admission: RE | Admit: 2017-09-02 | Discharge: 2017-09-02 | Disposition: A | Discharge status: Home / Self Care | Payer: Medicare Other | Source: Ambulatory Visit | Attending: Internal Medicine | Admitting: Internal Medicine

## 2017-09-02 DIAGNOSIS — R05 Cough: Secondary | ICD-10-CM

## 2017-09-02 DIAGNOSIS — R059 Cough, unspecified: Secondary | ICD-10-CM

## 2017-10-23 ENCOUNTER — Other Ambulatory Visit: Payer: Self-pay | Admitting: Neurology

## 2017-11-01 ENCOUNTER — Telehealth: Payer: Self-pay | Admitting: Neurology

## 2017-11-01 NOTE — Telephone Encounter (Signed)
Will resend today. The script that was sent did not have a signature back in march 11 th.

## 2017-11-01 NOTE — Telephone Encounter (Signed)
Morrie SheldonAshley with [facility] called requesting a refill for clonazePAM (KLONOPIN) 0.5 MG tablet

## 2017-12-22 ENCOUNTER — Emergency Department (HOSPITAL_BASED_OUTPATIENT_CLINIC_OR_DEPARTMENT_OTHER)
Admission: EM | Admit: 2017-12-22 | Discharge: 2017-12-23 | Disposition: A | Discharge status: Home / Self Care | Payer: Medicare Other | Attending: Emergency Medicine | Admitting: Emergency Medicine

## 2017-12-22 ENCOUNTER — Other Ambulatory Visit: Payer: Self-pay

## 2017-12-22 ENCOUNTER — Encounter (HOSPITAL_BASED_OUTPATIENT_CLINIC_OR_DEPARTMENT_OTHER): Payer: Self-pay

## 2017-12-22 DIAGNOSIS — Z79899 Other long term (current) drug therapy: Secondary | ICD-10-CM | POA: Diagnosis not present

## 2017-12-22 DIAGNOSIS — I10 Essential (primary) hypertension: Secondary | ICD-10-CM

## 2017-12-22 DIAGNOSIS — I110 Hypertensive heart disease with heart failure: Secondary | ICD-10-CM | POA: Insufficient documentation

## 2017-12-22 DIAGNOSIS — Z7982 Long term (current) use of aspirin: Secondary | ICD-10-CM | POA: Insufficient documentation

## 2017-12-22 DIAGNOSIS — I509 Heart failure, unspecified: Secondary | ICD-10-CM | POA: Insufficient documentation

## 2017-12-22 DIAGNOSIS — E119 Type 2 diabetes mellitus without complications: Secondary | ICD-10-CM | POA: Diagnosis not present

## 2017-12-22 NOTE — ED Triage Notes (Signed)
Per daughter pt with elevated BP during an in home speech therapy approx 430pm-pt took 1/2 BP pill and recheck with no improvement-denies c/o-presents to triage in own w/c-NAD

## 2017-12-23 NOTE — ED Provider Notes (Signed)
[facility] EMERGENCY DEPARTMENT Provider Note   CSN: [id number] Arrival date & time: 12/22/17  2121     History   Chief Complaint Chief Complaint  Patient presents with  . Hypertension    HPI Connie Poole is a 84 y.o. female.  The history is provided by the patient and a relative.  Hypertension  This is a recurrent problem. The problem occurs constantly. The problem has not changed since onset.Pertinent negatives include no chest pain, no headaches and no shortness of breath. Nothing aggravates the symptoms. Nothing relieves the symptoms.   Patient With history of arthritis, CHF, dementia presents with hypertension.  Patient was undergoing speech therapy today at home, and they noted her vitals were done and her blood pressure was elevated.  She normally takes her blood pressure medicines in the morning which she already did.  She was advised by her PCP to take a half a dose of her blood pressure pill.  Since that time her blood pressures remain the same.  She denies any other new complaints No weakness, no headache, no chest pain, shortness of breath.  No focal weakness.  No slurred speech Past Medical History:  Diagnosis Date  . Arthritis   . CHF (congestive heart failure) (HCC)   . Dementia   . Diabetes mellitus   . Glaucoma   . Hyperlipidemia   . Hypertension   . Sarcoidosis     Patient Active Problem List   Diagnosis Date Noted  . 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 Formed visual hallucinations 11/18/2015  . Lewy body dementia without behavioral disturbance 11/18/2015  . Irreducible umbilical hernia 06/13/2012    Past Surgical History:  Procedure Laterality Date  . ABDOMINAL HYSTERECTOMY    . TOTAL KNEE ARTHROPLASTY       OB History   None      Home Medications    Prior to Admission medications   Medication Sig Start Date End Date Taking? Authorizing Provider  ACCU-CHEK COMPACT TEST DRUM test strip  04/03/12   [provider]    ACCU-CHEK SOFTCLIX LANCETS lancets ONE STEP 03/10/12   [provider]  aspirin 81 MG tablet Take 81 mg by mouth daily.    [provider]  atorvastatin (LIPITOR) 40 MG tablet Take 40 mg by mouth daily.    [provider]  bimatoprost (LUMIGAN) 0.03 % ophthalmic solution 1 drop at bedtime.    [provider]  brimonidine (ALPHAGAN P) 0.1 % SOLN     [provider]  Calcium Carbonate-Vitamin D (CALCIUM 600+D PO) Take by mouth.    [provider]  carvedilol (COREG) 25 MG tablet  04/24/12   [provider]  clonazePAM (KLONOPIN) 0.5 MG tablet TAKE 1/2 TO 1 TABLET BY MOUTH AT BEDTIME AS NEEDED 10/24/17   Dohmeier, Porfirio Mylar, MD  dorzolamide (TRUSOPT) 2 % ophthalmic solution 1 drop 3 (three) times daily.    [provider]  fish oil-omega-3 fatty acids 1000 MG capsule Take 2 g by mouth daily.    [provider]  fluticasone Aleda Grana) 50 MCG/ACT nasal spray  11/15/15   [provider]  furosemide (LASIX) 40 MG tablet  03/18/12   [provider]  KLOR-CON M20 20 MEQ tablet  04/23/12   [provider]  loratadine (CLARITIN) 10 MG tablet Take 10 mg by mouth daily.    [provider]  sertraline (ZOLOFT) 50 MG tablet Take 50 mg by mouth daily.    [provider]  telmisartan (MICARDIS) 80 MG tablet Take 80 mg by mouth daily.    [provider]  traMADol Janean Sark) 50 MG tablet  04/25/12   [provider]  Vitamins A & D (VITAMIN A & D) [address] UNITS TABS Take by mouth.    [provider]    Family History Family History  Problem Relation Age of Onset  . Stroke Mother   . Heart disease Father   . Cancer Brother     Social History Social History   Tobacco Use  . Smoking status: Never Smoker  . Smokeless tobacco: Never Used  Substance Use Topics  . Alcohol use: No    Alcohol/week: 0.0 oz  . Drug use: Not on file     Allergies   Celebrex  [celecoxib] and Vioxx [rofecoxib]   Review of Systems Review of Systems  Constitutional: Negative for fever.  Respiratory: Negative for shortness of breath.   Cardiovascular: Negative for chest pain.  Musculoskeletal: Positive for arthralgias.  Neurological: Negative for dizziness, speech difficulty, weakness and headaches.  All other systems reviewed and are negative.    Physical Exam Updated Vital Signs BP (!) 211/63 (BP Location: Right Arm)   Pulse (!) 59   Temp 99.1 F (37.3 C) (Oral)   Resp 18   Ht 1.549 m ( )   Wt 88 kg (194 lb)   SpO2 97%   BMI 36.66 kg/m   Physical Exam CONSTITUTIONAL: Elderly, but no acute distress, smiling and well-appearing HEAD: Normocephalic/atraumatic EYES: EOMI ENMT: Mucous membranes moist NECK: supple no meningeal signs SPINE/BACK:entire spine nontender CV: S1/S2 noted, no murmurs/rubs/gallops noted LUNGS: Lungs are clear to auscultation bilaterally, no apparent distress ABDOMEN: soft, nontender, hernia noted that is nontender GU:no cva tenderness NEURO: Pt is awake/alert/appropriate, moves all extremitiesx4.  No facial droop.,  No arm or leg drift.  Normal speech EXTREMITIES: pulses normal/equal, full ROM, chronic edema lower extremities SKIN: warm, color normal PSYCH: no abnormalities of mood noted, alert and oriented to situation   ED Treatments / Results  Labs (all labs ordered are listed, but only abnormal results are displayed) Labs Reviewed - No data to display  EKG EKG Interpretation  Date/Time:  Thursday Dec 22 2017 23:25:47 EDT Ventricular Rate:  58 PR Interval:    QRS Duration: 101 QT Interval:  449 QTC Calculation: 441 R Axis:   63 Text Interpretation:  Sinus rhythm No significant change was found Confirmed by Zadie Rhine ([id number]) on 12/22/2017 11:42:46 PM   Radiology No results found.  Procedures Procedures (including critical care time)  Medications Ordered in ED Medications - No data to  display   Initial Impression / Assessment and Plan / ED Course  I have reviewed the triage vital signs and the nursing notes.   BP (!) 158/51 (BP Location: Right Arm)   Pulse (!) 54   Temp 98 F (36.7 C) (Oral)   Resp 18   Ht 1.549 m ( )   Wt 88 kg (194 lb)   SpO2 98%   BMI 36.66 kg/m  Pressure improved.  She is awake alert, no distress.  Smiling no focal weakness We agreed to defer any further testing as her PCP just recently obtained labs. I will not make any adjustments to her BP meds at this time since it is improved.  Daughter will call PCP tomorrow for further care.  We discussed strict ER return precautions No Signs of any hypertensive emergency  Final Clinical Impressions(s) / ED Diagnoses  Final diagnoses:  Essential hypertension    ED Discharge Orders    None       Zadie Rhine, MD 12/23/17 [phone]

## 2018-01-19 ENCOUNTER — Ambulatory Visit: Payer: Medicare Other | Admitting: Adult Health

## 2018-01-19 ENCOUNTER — Encounter: Payer: Self-pay | Admitting: Adult Health

## 2018-01-19 VITALS — BP 172/67 | HR 54 | Ht 61.0 in

## 2018-01-19 DIAGNOSIS — G3183 Dementia with Lewy bodies: Secondary | ICD-10-CM | POA: Diagnosis not present

## 2018-01-19 DIAGNOSIS — F0280 Dementia in other diseases classified elsewhere without behavioral disturbance: Secondary | ICD-10-CM | POA: Diagnosis not present

## 2018-01-19 NOTE — Progress Notes (Signed)
I have read the note, and I agree with the clinical assessment and plan.  Charles K Willis   

## 2018-01-19 NOTE — Progress Notes (Signed)
PATIENT: Connie Poole DOB: [birth year]  REASON FOR VISIT: follow up HISTORY FROM: patient  HISTORY OF PRESENT ILLNESS: Today 01/19/18:  Connie Poole is a 84 year old female with a history of dementia.  She returns today for follow-up.  She remains on Aricept.  She lives with her daughter.  Her daughter manages her medication.  The patient no longer prepares food.  Her daughter does this for her.  She completes ADLs with minimal assistance.  Her daughter manages her finances.  She does not have hallucinations during the day but her daughter reports that sometimes she waking up she will continue her dreams with reality.  Reports good appetite.  Denies any significant changes in mood or behavior.  Patient returns today for an evaluation.  HISTORY Connie Poole is a 84 year old female with a history of suspected Lewy body dementia.  She returns today for follow-up.  The patient is on Aricept and tolerating it well.  She continues to take Klonopin before bedtime.  Her daughter is with her today.  She reports overall she feels that she is remained stable.  She is able to complete her ADLs with minimal assistance.  Her daughter prepares her food and medication.  She reports that her mother has never been a good sleeper.  She states that some nights are better than others.  She reports that she tends to have hallucinations when she is waking up from sleep or a nap.  Otherwise she does not notice any hallucination during the day.  Patient returns today for an evaluation.    REVIEW OF SYSTEMS: Out of a complete 14 system review of symptoms, the patient complains only of the following symptoms, and all other reviewed systems are negative.  ALLERGIES: Allergies  Allergen Reactions  . Celebrex [Celecoxib]   . Vioxx [Rofecoxib]     HOME MEDICATIONS: Outpatient Medications Prior to Visit  Medication Sig Dispense Refill  . ACCU-CHEK COMPACT TEST DRUM test strip     . ACCU-CHEK SOFTCLIX LANCETS lancets ONE  STEP    . aspirin 81 MG tablet Take 81 mg by mouth daily.    Marland Kitchen. atorvastatin (LIPITOR) 40 MG tablet Take 40 mg by mouth daily.    . bimatoprost (LUMIGAN) 0.03 % ophthalmic solution 1 drop at bedtime.    . brimonidine (ALPHAGAN P) 0.1 % SOLN     . Calcium Carbonate-Vitamin D (CALCIUM 600+D PO) Take by mouth.    . carvedilol (COREG) 25 MG tablet     . clonazePAM (KLONOPIN) 0.5 MG tablet TAKE 1/2 TO 1 TABLET BY MOUTH AT BEDTIME AS NEEDED 30 tablet 5  . dorzolamide (TRUSOPT) 2 % ophthalmic solution 1 drop 3 (three) times daily.    . fish oil-omega-3 fatty acids 1000 MG capsule Take 2 g by mouth daily.    . fluticasone (FLONASE) 50 MCG/ACT nasal spray     . furosemide (LASIX) 40 MG tablet     . KLOR-CON M20 20 MEQ tablet     . loratadine (CLARITIN) 10 MG tablet Take 10 mg by mouth daily.    . sertraline (ZOLOFT) 50 MG tablet Take 50 mg by mouth daily.    Marland Kitchen. telmisartan (MICARDIS) 80 MG tablet Take 80 mg by mouth daily.    . traMADol (ULTRAM) 50 MG tablet     . Vitamins A & D (VITAMIN A & D) 40981-191425000-1000 UNITS TABS Take by mouth.     No facility-administered medications prior to visit.     PAST MEDICAL HISTORY:  Past Medical History:  Diagnosis Date  . Arthritis   . CHF (congestive heart failure) (HCC)   . Dementia   . Diabetes mellitus   . Glaucoma   . Hyperlipidemia   . Hypertension   . Sarcoidosis     PAST SURGICAL HISTORY: Past Surgical History:  Procedure Laterality Date  . ABDOMINAL HYSTERECTOMY    . TOTAL KNEE ARTHROPLASTY      FAMILY HISTORY: Family History  Problem Relation Age of Onset  . Stroke Mother   . Heart disease Father   . Cancer Brother     SOCIAL HISTORY: Social History   Socioeconomic History  . Marital status: Widowed    Spouse name: Not on file  . Number of children: Not on file  . Years of education: Not on file  . Highest education level: Not on file  Occupational History  . Not on file  Social Needs  . Financial resource strain: Not on file   . Food insecurity:    Worry: Not on file    Inability: Not on file  . Transportation needs:    Medical: Not on file    Non-medical: Not on file  Tobacco Use  . Smoking status: Never Smoker  . Smokeless tobacco: Never Used  Substance and Sexual Activity  . Alcohol use: No    Alcohol/week: 0.0 oz  . Drug use: Not on file  . Sexual activity: Not on file  Lifestyle  . Physical activity:    Days per week: Not on file    Minutes per session: Not on file  . Stress: Not on file  Relationships  . Social connections:    Talks on phone: Not on file    Gets together: Not on file    Attends religious service: Not on file    Active member of club or organization: Not on file    Attends meetings of clubs or organizations: Not on file    Relationship status: Not on file  . Intimate partner violence:    Fear of current or ex partner: Not on file    Emotionally abused: Not on file    Physically abused: Not on file    Forced sexual activity: Not on file  Other Topics Concern  . Not on file  Social History Narrative  . Not on file      PHYSICAL EXAM  Vitals:   01/19/18 1329  Height: 5\' 1"  (1.549 m)   Body mass index is 36.66 kg/m.   MMSE - Mini Mental State Exam 01/19/2018 07/20/2017 01/05/2017  Orientation to time 5 5 5   Orientation to Place 4 4 3   Registration 3 3 3   Attention/ Calculation 5 5 5   Recall 1 1 3   Language- name 2 objects 2 2 2   Language- repeat 1 1 0  Language- follow 3 step command 1 1 2   Language- read & follow direction 1 1 1   Write a sentence 1 1 1   Copy design 0 0 0  Total score 24 24 25      Generalized: Well developed, in no acute distress   Neurological examination  Mentation: Alert oriented to time, place, history taking. Follows all commands speech and language fluent Cranial nerve II-XII: Pupils were equal round reactive to light. Extraocular movements were full, visual field were full on confrontational test. Facial sensation and strength were  normal. Uvula tongue midline. Head turning and shoulder shrug  were normal and symmetric.  Hard of hearing Motor: The motor testing  reveals 5 over 5 strength of all 4 extremities. Good symmetric motor tone is noted throughout.  Sensory: Sensory testing is intact to soft touch on all 4 extremities. No evidence of extinction is noted.  Coordination: Cerebellar testing reveals good finger-nose-finger and heel-to-shin bilaterally.  Gait and station: Patient is in a wheelchair. Reflexes: Deep tendon reflexes are symmetric and normal bilaterally.   DIAGNOSTIC DATA (LABS, IMAGING, TESTING) - I reviewed patient records, labs, notes, testing and imaging myself where available.  Lab Results  Component Value Date   WBC 4.6 08/11/2012   HGB 10.8 (L) 08/11/2012   HCT 33.6 (L) 08/11/2012   MCV 87.5 08/11/2012   PLT 294 08/11/2012      Component Value Date/Time   NA 139 08/11/2012 1427   K 4.0 08/11/2012 1427   CL 100 08/11/2012 1427   CO2 28 08/11/2012 1427   GLUCOSE 160 (H) 08/11/2012 1427   BUN 18 08/11/2012 1427   CREATININE 0.90 08/11/2012 1427   CALCIUM 9.5 08/11/2012 1427   PROT 6.9 08/11/2012 1427   ALBUMIN 3.6 08/11/2012 1427   AST 18 08/11/2012 1427   ALT 18 08/11/2012 1427   ALKPHOS 73 08/11/2012 1427   BILITOT 0.3 08/11/2012 1427   GFRNONAA 55 (L) 08/11/2012 1427   GFRAA 64 (L) 08/11/2012 1427       ASSESSMENT AND PLAN 84 y.o. year old female  has a past medical history of Arthritis, CHF (congestive heart failure) (HCC), Dementia, Diabetes mellitus, Glaucoma, Hyperlipidemia, Hypertension, and Sarcoidosis. here with:  1.  Memory disturbance-suspected Lewy body dementia  The patient's memory score has remained stable.  She will remain on Aricept 5 mg at bedtime.  She will continue with Klonopin 1 mg at bedtime.  I have advised that if her symptoms worsen or she develops new symptoms he should let us know.  She will follow-up in 6 months or sooner if needed.  I spent 15  minutes with the patient. 50% of this time was spent reviewing memory score   Connie Penny, MSN, NP-C 01/19/2018, 1:36 PM [facility] [address] [place], [place] [address] [phone]

## 2018-01-19 NOTE — Patient Instructions (Signed)
Your Plan:  Continue Aricept and Klonopin Memory score is stable If your symptoms worsen or you develop new symptoms please let us know. '  [facility]. I hope we have been able to provide you high quality care today.  You may receive a patient satisfaction survey over the next few weeks. We would appreciate your feedback and comments so that we may continue to improve ourselves and the health of our patients.

## 2018-01-20 ENCOUNTER — Telehealth: Payer: Self-pay

## 2018-01-20 NOTE — Telephone Encounter (Signed)
Notes sent to scheduling [facility].

## 2018-02-02 ENCOUNTER — Telehealth (HOSPITAL_COMMUNITY): Payer: Self-pay | Admitting: Internal Medicine

## 2018-02-07 ENCOUNTER — Other Ambulatory Visit (HOSPITAL_COMMUNITY): Payer: Self-pay | Admitting: Internal Medicine

## 2018-02-07 DIAGNOSIS — R011 Cardiac murmur, unspecified: Secondary | ICD-10-CM

## 2018-02-07 NOTE — Telephone Encounter (Signed)
  02/02/2018 01:01 PM Phone (Outgoing) [name], Connie B (Self) [phone] (M)   Left Message - Called pt and lmsg for her to CB to sch echo..RG    By Trina AoGriffin, Cristi Gwynn A    02/03/2018 09:03 AM Phone (Outgoing) [name], Connie B (Self) [phone] Judie Petit(M)   Left Message - Called dtr and lmsg for her to CB to get pt sch for echo..RG    By Trina AoGriffin, Zyanna Leisinger A    02/06/2018 11:34 AM Phone (Outgoing) [name], Connie B (Self) [phone] (M)   Left Message - Called pt and lmsg for her to CB to sch echo..RG    By Elita BooneGriffin, Maliya Marich A

## 2018-02-09 ENCOUNTER — Ambulatory Visit (HOSPITAL_COMMUNITY): Payer: Medicare Other | Attending: Internal Medicine

## 2018-02-09 ENCOUNTER — Other Ambulatory Visit: Payer: Self-pay

## 2018-02-09 DIAGNOSIS — R011 Cardiac murmur, unspecified: Secondary | ICD-10-CM | POA: Insufficient documentation

## 2018-02-09 DIAGNOSIS — I358 Other nonrheumatic aortic valve disorders: Secondary | ICD-10-CM | POA: Diagnosis not present

## 2018-02-09 DIAGNOSIS — I119 Hypertensive heart disease without heart failure: Secondary | ICD-10-CM | POA: Insufficient documentation

## 2018-02-09 DIAGNOSIS — E785 Hyperlipidemia, unspecified: Secondary | ICD-10-CM | POA: Diagnosis not present

## 2018-02-09 DIAGNOSIS — E119 Type 2 diabetes mellitus without complications: Secondary | ICD-10-CM | POA: Diagnosis not present

## 2018-02-20 ENCOUNTER — Telehealth: Payer: Self-pay | Admitting: Adult Health

## 2018-02-20 NOTE — Telephone Encounter (Signed)
Called the patient's daughter to discuss. No answer. LVM for the pt to call back.

## 2018-02-20 NOTE — Telephone Encounter (Signed)
Pt's daughter Dois [facility]enportSandra [facility] needs clarification for clonazePAM (KLONOPIN) 0.5 MG tablet. She has been breaking it in half. She said pt's hallucinations are more frequent, fierce and aggressive at night. She is wanting to know if they can increase it at night. Please call to advise.

## 2018-02-21 NOTE — Telephone Encounter (Signed)
Pt's daughter returned Rn's call. She will be available anytime after 3pm today.

## 2018-02-21 NOTE — Telephone Encounter (Signed)
Called the patients daughter Dois DavenportSandra. No answer. LVM informing her to give me a call back.

## 2018-03-07 NOTE — Telephone Encounter (Signed)
Daughter called back and informed that her hallucainations are worse and lasting longer then it has in the past. She is asking if we can give 1 tablet to her mom at bedtime instead of the 1/2 tablet. I informed her that the script actually states she could give up to 1 to help her mom. She should not need a refill until august. I did inform her that if the pharmacy needed one to have them reach out to us for a refill request. Pt's daughter verbalized understanding.

## 2018-04-26 ENCOUNTER — Other Ambulatory Visit: Payer: Self-pay | Admitting: Internal Medicine

## 2018-04-26 ENCOUNTER — Ambulatory Visit
Admission: RE | Admit: 2018-04-26 | Discharge: 2018-04-26 | Disposition: A | Discharge status: Home / Self Care | Payer: Medicare Other | Source: Ambulatory Visit | Attending: Internal Medicine | Admitting: Internal Medicine

## 2018-04-26 DIAGNOSIS — L0201 Cutaneous abscess of face: Secondary | ICD-10-CM | POA: Insufficient documentation

## 2018-04-26 DIAGNOSIS — R062 Wheezing: Secondary | ICD-10-CM

## 2018-04-26 DIAGNOSIS — B370 Candidal stomatitis: Secondary | ICD-10-CM | POA: Insufficient documentation

## 2018-04-26 DIAGNOSIS — R059 Cough, unspecified: Secondary | ICD-10-CM

## 2018-04-26 DIAGNOSIS — R410 Disorientation, unspecified: Secondary | ICD-10-CM | POA: Insufficient documentation

## 2018-04-26 DIAGNOSIS — R05 Cough: Secondary | ICD-10-CM

## 2018-04-26 DIAGNOSIS — N183 Chronic kidney disease, stage 3 (moderate): Secondary | ICD-10-CM

## 2018-04-26 DIAGNOSIS — I129 Hypertensive chronic kidney disease with stage 1 through stage 4 chronic kidney disease, or unspecified chronic kidney disease: Secondary | ICD-10-CM

## 2018-04-26 DIAGNOSIS — J40 Bronchitis, not specified as acute or chronic: Secondary | ICD-10-CM

## 2018-04-26 DIAGNOSIS — B379 Candidiasis, unspecified: Secondary | ICD-10-CM

## 2018-04-26 LAB — CBC AND DIFFERENTIAL
HCT: 34 — AB (ref 36–46)
HEMOGLOBIN: 10 — AB (ref 12.0–16.0)
NEUTROS ABS: 1
Platelets: 305 (ref 150–399)
WBC: 2.9

## 2018-04-28 ENCOUNTER — Telehealth: Payer: Self-pay | Admitting: Adult Health

## 2018-04-28 NOTE — Telephone Encounter (Addendum)
Spoke with daughter, Dois DavenportSandra who stated it is taking longer for patient to recover from hallucination episodes. She continues to have episodes of disorientation which is common for her, but it's taking longer to collect herself afterwards. Dois DavenportSandra is asking if this is to be expected with progressing dementia or is there medication that would be more helpful to her mother? She is currently taking clonazepam 0.5 mg, one tablet every night.  Dois DavenportSandra stated she would like a return call today before this office closes. This RN [facility] Confirmed [facility]. She verbalized understanding, appreciation.

## 2018-04-28 NOTE — Telephone Encounter (Signed)
Pt daughter([name]) is asking for a call from RN to discuss changes in pt. Daughter states pt appears to be more anxious at night and there is a decline in memory. Daughter asking if there needs to be a change in medication.  Please call

## 2018-04-28 NOTE — Telephone Encounter (Signed)
This could be a progression of memory disturbance. However if she is noticing abrupt changes with hallucinations and memory she needs to come back for another appt.

## 2018-04-28 NOTE — Telephone Encounter (Signed)
LVM advising daughter that this could be related to patient's dementia and memory disturbance. However if she has noticed abrupt changes in hallucinations or memory loss, she can schedule patient for sooner FU. Advised office is closed and requested she call Mon. Left number.

## 2018-05-03 ENCOUNTER — Encounter: Payer: Self-pay | Admitting: Internal Medicine

## 2018-05-03 DIAGNOSIS — R410 Disorientation, unspecified: Secondary | ICD-10-CM

## 2018-05-03 DIAGNOSIS — B370 Candidal stomatitis: Secondary | ICD-10-CM

## 2018-05-03 DIAGNOSIS — I129 Hypertensive chronic kidney disease with stage 1 through stage 4 chronic kidney disease, or unspecified chronic kidney disease: Secondary | ICD-10-CM

## 2018-05-03 DIAGNOSIS — R062 Wheezing: Secondary | ICD-10-CM

## 2018-05-03 DIAGNOSIS — L0201 Cutaneous abscess of face: Secondary | ICD-10-CM

## 2018-05-06 ENCOUNTER — Emergency Department (HOSPITAL_COMMUNITY): Payer: Medicare Other

## 2018-05-06 ENCOUNTER — Encounter (HOSPITAL_COMMUNITY): Payer: Self-pay | Admitting: Emergency Medicine

## 2018-05-06 ENCOUNTER — Other Ambulatory Visit: Payer: Self-pay

## 2018-05-06 ENCOUNTER — Emergency Department (HOSPITAL_COMMUNITY)
Admission: EM | Admit: 2018-05-06 | Discharge: 2018-05-06 | Disposition: A | Discharge status: Home / Self Care | Payer: Medicare Other | Attending: Emergency Medicine | Admitting: Emergency Medicine

## 2018-05-06 DIAGNOSIS — F0390 Unspecified dementia without behavioral disturbance: Secondary | ICD-10-CM | POA: Diagnosis not present

## 2018-05-06 DIAGNOSIS — I110 Hypertensive heart disease with heart failure: Secondary | ICD-10-CM | POA: Insufficient documentation

## 2018-05-06 DIAGNOSIS — I509 Heart failure, unspecified: Secondary | ICD-10-CM | POA: Insufficient documentation

## 2018-05-06 DIAGNOSIS — Z96659 Presence of unspecified artificial knee joint: Secondary | ICD-10-CM | POA: Insufficient documentation

## 2018-05-06 DIAGNOSIS — Z79899 Other long term (current) drug therapy: Secondary | ICD-10-CM | POA: Insufficient documentation

## 2018-05-06 DIAGNOSIS — Z7982 Long term (current) use of aspirin: Secondary | ICD-10-CM | POA: Diagnosis not present

## 2018-05-06 DIAGNOSIS — M6281 Muscle weakness (generalized): Secondary | ICD-10-CM | POA: Insufficient documentation

## 2018-05-06 DIAGNOSIS — R531 Weakness: Secondary | ICD-10-CM

## 2018-05-06 LAB — CBC WITH DIFFERENTIAL/PLATELET
BASOS PCT: 1 %
Basophils Absolute: 0 10*3/uL (ref 0.0–0.1)
EOS ABS: 0.2 10*3/uL (ref 0.0–0.7)
Eosinophils Relative: 5 %
HCT: 31 % — ABNORMAL LOW (ref 36.0–46.0)
HEMOGLOBIN: 9.7 g/dL — AB (ref 12.0–15.0)
Lymphocytes Relative: 44 %
Lymphs Abs: 2.1 10*3/uL (ref 0.7–4.0)
MCH: 26.7 pg (ref 26.0–34.0)
MCHC: 31.3 g/dL (ref 30.0–36.0)
MCV: 85.4 fL (ref 78.0–100.0)
MONOS PCT: 12 %
Monocytes Absolute: 0.5 10*3/uL (ref 0.1–1.0)
NEUTROS PCT: 38 %
Neutro Abs: 1.8 10*3/uL (ref 1.7–7.7)
Platelets: 415 10*3/uL — ABNORMAL HIGH (ref 150–400)
RBC: 3.63 MIL/uL — ABNORMAL LOW (ref 3.87–5.11)
RDW: 14.2 % (ref 11.5–15.5)
WBC: 4.7 10*3/uL (ref 4.0–10.5)

## 2018-05-06 LAB — URINALYSIS, ROUTINE W REFLEX MICROSCOPIC
Bilirubin Urine: NEGATIVE
Glucose, UA: NEGATIVE mg/dL
HGB URINE DIPSTICK: NEGATIVE
KETONES UR: NEGATIVE mg/dL
Leukocytes, UA: NEGATIVE
Nitrite: NEGATIVE
PH: 7 (ref 5.0–8.0)
Protein, ur: NEGATIVE mg/dL
SPECIFIC GRAVITY, URINE: 1.01 (ref 1.005–1.030)

## 2018-05-06 LAB — BASIC METABOLIC PANEL
Anion gap: 10 (ref 5–15)
BUN: 20 mg/dL (ref 8–23)
CHLORIDE: 100 mmol/L (ref 98–111)
CO2: 29 mmol/L (ref 22–32)
CREATININE: 1.15 mg/dL — AB (ref 0.44–1.00)
Calcium: 9.4 mg/dL (ref 8.9–10.3)
GFR calc Af Amer: 45 mL/min — ABNORMAL LOW (ref >60)
GFR calc non Af Amer: 39 mL/min — ABNORMAL LOW (ref >60)
Glucose, Bld: 95 mg/dL (ref 70–99)
Potassium: 4.3 mmol/L (ref 3.5–5.1)
SODIUM: 139 mmol/L (ref 135–145)

## 2018-05-06 NOTE — ED Notes (Signed)
Bed: WA11 Expected date:  Expected time:  Means of arrival:  Comments: EMS 

## 2018-05-06 NOTE — Discharge Instructions (Addendum)
Follow-up with your family doctor this week for recheck 

## 2018-05-06 NOTE — ED Provider Notes (Signed)
[facility]-EMERGENCY DEPT Provider Note   CSN: [id number] Arrival date & time: 05/06/18  0950     History   Chief Complaint Chief Complaint  Patient presents with  . Fatigue    pt states she just doesn't feel good    HPI Connie Poole is a 84 y.o. female.  Family states patient seems to be more fatigued recently.  No fever no cough no vomiting  The history is provided by a relative. No language interpreter was used.  Weakness  Primary symptoms include no focal weakness. This is a recurrent problem. The problem has not changed since onset.There was no focality noted. There has been no fever. Pertinent negatives include no shortness of breath. There were no medications administered prior to arrival. Associated medical issues do not include trauma.    Past Medical History:  Diagnosis Date  . Arthritis   . CHF (congestive heart failure) (HCC)   . Dementia   . Diabetes mellitus   . Glaucoma   . Hyperlipidemia   . Hypertension   . Sarcoidosis     Patient Active Problem List   Diagnosis Date Noted  . Thrush 04/26/2018  . Hypertensive renal disease with renal failure 04/26/2018  . Cutaneous abscess of face 04/26/2018  . Wheezing 04/26/2018  . Confusion 04/26/2018  . 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 Formed visual hallucinations 11/18/2015  . Lewy body dementia without behavioral disturbance 11/18/2015  . Irreducible umbilical hernia 06/13/2012    Past Surgical History:  Procedure Laterality Date  . ABDOMINAL HYSTERECTOMY    . TOTAL KNEE ARTHROPLASTY       OB History   None      Home Medications    Prior to Admission medications   Medication Sig Start Date End Date Taking? Authorizing Provider  aspirin 81 MG tablet Take 81 mg by mouth daily.   Yes [provider]  atorvastatin (LIPITOR) 40 MG tablet Take 40 mg by mouth daily.   Yes [provider]  brimonidine (ALPHAGAN P) 0.1 % SOLN Apply 1 drop to eye every  8 (eight) hours.    Yes [provider]  Calcium Carbonate-Vitamin D (CALCIUM 600+D PO) Take by mouth.   Yes [provider]  cephALEXin (KEFLEX) 500 MG capsule Take 500 mg by mouth daily. 04/26/18  Yes [provider]  cetirizine (ZYRTEC) 10 MG tablet Take 10 mg by mouth daily.   Yes [provider]  Cholecalciferol (D3-50 PO) Take 50 mg by mouth daily.   Yes [provider]  clonazePAM (KLONOPIN) 0.5 MG tablet TAKE 1/2 TO 1 TABLET BY MOUTH AT BEDTIME AS NEEDED 10/24/17  Yes Dohmeier, Porfirio Mylar, MD  donepezil (ARICEPT) 5 MG tablet Take 5 mg by mouth every evening. 04/21/18  Yes [provider]  dorzolamide (TRUSOPT) 2 % ophthalmic solution 1 drop 3 (three) times daily.   Yes [provider]  furosemide (LASIX) 40 MG tablet Take 40 mg by mouth daily.  03/18/12  Yes [provider]  ipratropium (ATROVENT) 0.06 % nasal spray Place 2 sprays into both nostrils 2 (two) times daily.   Yes [provider]  KLOR-CON M20 20 MEQ tablet Take 20 mEq by mouth daily.  04/23/12  Yes [provider]  LUMIGAN 0.01 % SOLN Apply 1 drop to eye daily. 04/26/18  Yes [provider]  Multiple Vitamin (MULTIVITAMIN WITH MINERALS) TABS tablet Take 1 tablet by mouth daily.   Yes [provider]  Multiple Vitamins-Minerals (  PRESERVISION AREDS 2 PO) Take 1 tablet by mouth daily.   Yes [provider]  Omega-3 Fatty Acids (FISH OIL) 1000 MG CAPS Take 1,000 mg by mouth daily.   Yes [provider]  PRESCRIPTION MEDICATION Take 5 mLs by mouth 4 (four) times daily. Nystatin/Lidocaine 04/26/18  Yes [provider]  telmisartan (MICARDIS) 80 MG tablet Take 80 mg by mouth daily.   Yes [provider]  Turmeric ([employer] TURMERIC CURCUMIN) 500 MG CAPS Take 1 tablet by mouth daily.   Yes [provider]  ACCU-CHEK COMPACT TEST DRUM test strip  04/03/12   [provider]  ACCU-CHEK SOFTCLIX  LANCETS lancets ONE STEP 03/10/12   [provider]    Family History Family History  Problem Relation Age of Onset  . Stroke Mother   . Heart disease Father   . Cancer Brother     Social History Social History   Tobacco Use  . Smoking status: Never Smoker  . Smokeless tobacco: Never Used  Substance Use Topics  . Alcohol use: No    Alcohol/week: 0.0 standard drinks  . Drug use: Not on file     Allergies   Celebrex [celecoxib] and Vioxx [rofecoxib]   Review of Systems Review of Systems  Unable to perform ROS: Dementia  Respiratory: Negative for shortness of breath.   Neurological: Positive for weakness. Negative for focal weakness.     Physical Exam Updated Vital Signs BP (!) 141/52   Pulse 64   Temp 98.4 F (36.9 C) (Oral)   Resp 16   SpO2 96%   Physical Exam  Constitutional: She appears well-developed.  HENT:  Head: Normocephalic.  Eyes: Conjunctivae and EOM are normal. No scleral icterus.  Neck: Neck supple. No thyromegaly present.  Cardiovascular: Normal rate and regular rhythm. Exam reveals no gallop and no friction rub.  No murmur heard. Pulmonary/Chest: No stridor. She has no wheezes. She has no rales. She exhibits no tenderness.  Abdominal: She exhibits no distension. There is no tenderness. There is no rebound.  Musculoskeletal: Normal range of motion. She exhibits no edema.  Lymphadenopathy:    She has no cervical adenopathy.  Neurological: She exhibits normal muscle tone. Coordination normal.  Oriented to person only  Skin: No rash noted. No erythema.  Psychiatric: She has a normal mood and affect. Her behavior is normal.     ED Treatments / Results  Labs (all labs ordered are listed, but only abnormal results are displayed) Labs Reviewed  CBC WITH DIFFERENTIAL/PLATELET - Abnormal; Notable for the following components:      Result Value   RBC 3.63 (*)    Hemoglobin 9.7 (*)    HCT 31.0 (*)    Platelets 415 (*)    All other  components within normal limits  BASIC METABOLIC PANEL - Abnormal; Notable for the following components:   Creatinine, Ser 1.15 (*)    GFR calc non Af Amer 39 (*)    GFR calc Af Amer 45 (*)    All other components within normal limits  URINALYSIS, ROUTINE W REFLEX MICROSCOPIC    EKG None  Radiology Dg Abd Acute W/chest  Result Date: 05/06/2018 CLINICAL DATA:  Nausea.  Rectal pain. EXAM: DG ABDOMEN ACUTE W/ 1V CHEST COMPARISON:  Chest radiographs, 04/26/2018. FINDINGS: Relative paucity of bowel gas. There is no bowel dilation to suggest obstruction. No air-fluid levels. No evidence of adynamic ileus. No free air. No evidence of renal or ureteral stones. Soft tissues are unremarkable  other than aorta iliac vascular calcifications. Heart is mildly enlarged. No mediastinal or hilar masses. No evidence of pneumonia or pulmonary edema. Skeletal structures are demineralized. No acute skeletal abnormality. IMPRESSION: 1. No acute findings. No evidence of bowel obstruction, generalized adynamic ileus or free air. 2. No acute cardiopulmonary disease. Electronically Signed   By: Amie Portlandavid  Ormond M.D.   On: 05/06/2018 11:21    Procedures Procedures (including critical care time)  Medications Ordered in ED Medications - No data to display   Initial Impression / Assessment and Plan / ED Course  I have reviewed the triage vital signs and the nursing notes.  Pertinent labs & imaging results that were available during my care of the patient were reviewed by me and considered in my medical decision making (see chart for details).     Abdomen x-rays unremarkable.  Patient with dementia and general fatigue.  She is referred back to her family doctor for possible medication change  Final Clinical Impressions(s) / ED Diagnoses   Final diagnoses:  Weakness    ED Discharge Orders    None       Bethann BerkshireZammit, Trixie Maclaren, MD 05/06/18 1417

## 2018-05-06 NOTE — ED Notes (Signed)
[facility] contacted for transportation back home. [facility] advised several ahead of this patient and will come as soon as possible

## 2018-05-06 NOTE — ED Triage Notes (Signed)
Pt arrived via [facility]. Pt was here 05/04/18 for a mouth infection. Pt had yeast in mouth and was given a mouthwash while here on 9/19  This morning, family states pt was hollering. Pt states she feels bad and "please pray for me" Pt's Daughter called EMS so that the patient could be reevaluated.  Pt has dementia at baseline; and is mentally at her baseline with no changes.   No distinct chief complaint from patient

## 2018-05-06 NOTE — ED Notes (Signed)
purewick placed

## 2018-05-06 NOTE — ED Notes (Signed)
Pt to xray

## 2018-05-12 ENCOUNTER — Emergency Department (HOSPITAL_COMMUNITY): Payer: Medicare Other

## 2018-05-12 ENCOUNTER — Other Ambulatory Visit: Payer: Self-pay

## 2018-05-12 ENCOUNTER — Emergency Department (HOSPITAL_COMMUNITY)
Admission: EM | Admit: 2018-05-12 | Discharge: 2018-05-12 | Disposition: A | Discharge status: Home / Self Care | Payer: Medicare Other | Attending: Emergency Medicine | Admitting: Emergency Medicine

## 2018-05-12 ENCOUNTER — Encounter (HOSPITAL_COMMUNITY): Payer: Self-pay | Admitting: *Deleted

## 2018-05-12 DIAGNOSIS — I509 Heart failure, unspecified: Secondary | ICD-10-CM | POA: Diagnosis not present

## 2018-05-12 DIAGNOSIS — F0281 Dementia in other diseases classified elsewhere with behavioral disturbance: Secondary | ICD-10-CM | POA: Diagnosis not present

## 2018-05-12 DIAGNOSIS — Z7982 Long term (current) use of aspirin: Secondary | ICD-10-CM | POA: Insufficient documentation

## 2018-05-12 DIAGNOSIS — Z96659 Presence of unspecified artificial knee joint: Secondary | ICD-10-CM | POA: Insufficient documentation

## 2018-05-12 DIAGNOSIS — E119 Type 2 diabetes mellitus without complications: Secondary | ICD-10-CM | POA: Diagnosis not present

## 2018-05-12 DIAGNOSIS — Z79899 Other long term (current) drug therapy: Secondary | ICD-10-CM | POA: Insufficient documentation

## 2018-05-12 DIAGNOSIS — G510 Bell's palsy: Secondary | ICD-10-CM | POA: Diagnosis not present

## 2018-05-12 DIAGNOSIS — I130 Hypertensive heart and chronic kidney disease with heart failure and stage 1 through stage 4 chronic kidney disease, or unspecified chronic kidney disease: Secondary | ICD-10-CM | POA: Insufficient documentation

## 2018-05-12 DIAGNOSIS — R29810 Facial weakness: Secondary | ICD-10-CM | POA: Diagnosis present

## 2018-05-12 DIAGNOSIS — G3183 Dementia with Lewy bodies: Secondary | ICD-10-CM | POA: Insufficient documentation

## 2018-05-12 DIAGNOSIS — N189 Chronic kidney disease, unspecified: Secondary | ICD-10-CM | POA: Insufficient documentation

## 2018-05-12 LAB — I-STAT TROPONIN, ED: Troponin i, poc: 0 ng/mL (ref 0.00–0.08)

## 2018-05-12 LAB — CBC WITH DIFFERENTIAL/PLATELET
Basophils Absolute: 0.1 10*3/uL (ref 0.0–0.1)
Basophils Relative: 1 %
EOS ABS: 0.2 10*3/uL (ref 0.0–0.7)
Eosinophils Relative: 4 %
HCT: 32 % — ABNORMAL LOW (ref 36.0–46.0)
HEMOGLOBIN: 9.9 g/dL — AB (ref 12.0–15.0)
LYMPHS ABS: 2.2 10*3/uL (ref 0.7–4.0)
Lymphocytes Relative: 45 %
MCH: 26.7 pg (ref 26.0–34.0)
MCHC: 30.9 g/dL (ref 30.0–36.0)
MCV: 86.3 fL (ref 78.0–100.0)
Monocytes Absolute: 0.5 10*3/uL (ref 0.1–1.0)
Monocytes Relative: 9 %
NEUTROS ABS: 2 10*3/uL (ref 1.7–7.7)
Neutrophils Relative %: 41 %
Platelets: 432 10*3/uL — ABNORMAL HIGH (ref 150–400)
RBC: 3.71 MIL/uL — AB (ref 3.87–5.11)
RDW: 14.3 % (ref 11.5–15.5)
WBC: 4.8 10*3/uL (ref 4.0–10.5)

## 2018-05-12 LAB — I-STAT CHEM 8, ED
BUN: 18 mg/dL (ref 8–23)
CALCIUM ION: 1.17 mmol/L (ref 1.15–1.40)
CHLORIDE: 100 mmol/L (ref 98–111)
Creatinine, Ser: 1.1 mg/dL — ABNORMAL HIGH (ref 0.44–1.00)
GLUCOSE: 94 mg/dL (ref 70–99)
HCT: 30 % — ABNORMAL LOW (ref 36.0–46.0)
Hemoglobin: 10.2 g/dL — ABNORMAL LOW (ref 12.0–15.0)
Potassium: 3.5 mmol/L (ref 3.5–5.1)
Sodium: 139 mmol/L (ref 135–145)
TCO2: 26 mmol/L (ref 22–32)

## 2018-05-12 LAB — URINALYSIS, ROUTINE W REFLEX MICROSCOPIC
Bilirubin Urine: NEGATIVE
GLUCOSE, UA: NEGATIVE mg/dL
HGB URINE DIPSTICK: NEGATIVE
Ketones, ur: NEGATIVE mg/dL
LEUKOCYTES UA: NEGATIVE
Nitrite: NEGATIVE
PROTEIN: NEGATIVE mg/dL
Specific Gravity, Urine: 1.011 (ref 1.005–1.030)
pH: 7 (ref 5.0–8.0)

## 2018-05-12 MED ORDER — CARVEDILOL 25 MG PO TABS
25.0000 mg | ORAL_TABLET | Freq: Two times a day (BID) | ORAL | Status: DC
  Administered 2018-05-12: 25 mg via ORAL
  Filled 2018-05-12: qty 1

## 2018-05-12 MED ORDER — PREDNISONE 20 MG PO TABS: ORAL_TABLET | ORAL | 0 refills | Status: DC

## 2018-05-12 MED ORDER — FUROSEMIDE 40 MG PO TABS
40.0000 mg | ORAL_TABLET | Freq: Once | ORAL | Status: AC
  Administered 2018-05-12: 40 mg via ORAL
  Filled 2018-05-12: qty 1

## 2018-05-12 NOTE — ED Notes (Signed)
Pt brought in from home via EMS for increased agitation, hallucination and increased confusion noticed since 9/25 by daughter with whom pt resides. EMS reports that pt has been since by the [facility] and was found to have "no neuro deficits". Pt presents with obvious left facial drop that EMS says family described as being "off and on" since 9/25. Pt with known history of dementia.

## 2018-05-12 NOTE — ED Notes (Signed)
Patient transported to X-ray 

## 2018-05-12 NOTE — ED Notes (Signed)
Bed: WA07 Expected date:  Expected time:  Means of arrival:  Comments: 84 yr old dementia

## 2018-05-12 NOTE — ED Provider Notes (Signed)
[facility]-EMERGENCY DEPT Provider Note   CSN: [id number] Arrival date & time: 05/12/18  0347     History   Chief Complaint Chief Complaint  Patient presents with  . Altered Mental Status    HPI Connie Poole is a 84 y.o. female.  The history is provided by a relative and the EMS personnel. The history is limited by the condition of the patient (level 5 dementai).  Altered Mental Status   This is a recurrent problem. The current episode started 3 to 5 hours ago. The problem has been resolved. Associated symptoms include agitation and hallucinations. Pertinent negatives include no seizures. Risk factors include a recent illness (Lewy Body dementia). Her past medical history is significant for dementia. Her past medical history does not include head trauma or heart disease.  Also left nasal labial fold has been flat since Wednesday.  No changes in speech, no weakness no numbness.  No f/c/r.   Past Medical History:  Diagnosis Date  . Arthritis   . CHF (congestive heart failure) (HCC)   . Dementia   . Diabetes mellitus   . Glaucoma   . Hyperlipidemia   . Hypertension   . Sarcoidosis     Patient Active Problem List   Diagnosis Date Noted  . Thrush 04/26/2018  . Hypertensive renal disease with renal failure 04/26/2018  . Cutaneous abscess of face 04/26/2018  . Wheezing 04/26/2018  . Confusion 04/26/2018  . 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 Formed visual hallucinations 11/18/2015  . Lewy body dementia without behavioral disturbance 11/18/2015  . Irreducible umbilical hernia 06/13/2012    Past Surgical History:  Procedure Laterality Date  . ABDOMINAL HYSTERECTOMY    . TOTAL KNEE ARTHROPLASTY       OB History   None      Home Medications    Prior to Admission medications   Medication Sig Start Date End Date Taking? Authorizing Provider  aspirin 81 MG tablet Take 81 mg by mouth daily.   Yes [provider]  atorvastatin  (LIPITOR) 40 MG tablet Take 40 mg by mouth daily.   Yes [provider]  brimonidine (ALPHAGAN P) 0.1 % SOLN Apply 1 drop to eye every 8 (eight) hours.    Yes [provider]  Calcium Carbonate-Vitamin D (CALCIUM 600+D PO) Take 1 tablet by mouth daily.    Yes [provider]  carvedilol (COREG) 25 MG tablet Take 25 mg by mouth 2 (two) times daily with a meal.   Yes [provider]  cetirizine (ZYRTEC) 10 MG tablet Take 10 mg by mouth daily.   Yes [provider]  Cholecalciferol (D3-50 PO) Take 50 mg by mouth daily.   Yes [provider]  clonazePAM (KLONOPIN) 0.5 MG tablet TAKE 1/2 TO 1 TABLET BY MOUTH AT BEDTIME AS NEEDED 10/24/17  Yes Dohmeier, Porfirio Mylar, MD  donepezil (ARICEPT) 5 MG tablet Take 5 mg by mouth every evening. 04/21/18  Yes [provider]  dorzolamide (TRUSOPT) 2 % ophthalmic solution 1 drop 3 (three) times daily.   Yes [provider]  furosemide (LASIX) 40 MG tablet Take 40 mg by mouth daily.  03/18/12  Yes [provider]  ipratropium (ATROVENT) 0.06 % nasal spray Place 2 sprays into both nostrils 2 (two) times daily.   Yes [provider]  KLOR-CON M20 20 MEQ tablet Take 20 mEq by mouth daily.  04/23/12  Yes [provider]  LUMIGAN 0.01 % SOLN Apply 1 drop  to eye daily. 04/26/18  Yes [provider]  Multiple Vitamin (MULTIVITAMIN WITH MINERALS) TABS tablet Take 1 tablet by mouth daily.   Yes [provider]  Multiple Vitamins-Minerals (PRESERVISION AREDS 2 PO) Take 1 tablet by mouth daily.   Yes [provider]  Omega-3 Fatty Acids (FISH OIL) 1000 MG CAPS Take 1,000 mg by mouth daily.   Yes [provider]  sertraline (ZOLOFT) 50 MG tablet Take 50 mg by mouth daily.   Yes [provider]  telmisartan (MICARDIS) 80 MG tablet Take 80 mg by mouth daily.   Yes [provider]  Turmeric ([employer]S TURMERIC CURCUMIN) 500 MG CAPS Take 1 tablet by  mouth daily.   Yes [provider]  ACCU-CHEK COMPACT TEST DRUM test strip  04/03/12   [provider]  ACCU-CHEK SOFTCLIX LANCETS lancets ONE STEP 03/10/12   [provider]  predniSONE (DELTASONE) 20 MG tablet 3 tabs po day one, then 2 po daily x 4 days 05/12/18   Nicanor Alcon, Syra Sirmons, MD    Family History Family History  Problem Relation Age of Onset  . Stroke Mother   . Heart disease Father   . Cancer Brother     Social History Social History   Tobacco Use  . Smoking status: Never Smoker  . Smokeless tobacco: Never Used  Substance Use Topics  . Alcohol use: No    Alcohol/week: 0.0 standard drinks  . Drug use: Not on file     Allergies   Celebrex [celecoxib] and Vioxx [rofecoxib]   Review of Systems Review of Systems  Unable to perform ROS: Dementia  Constitutional: Negative for fever.  Eyes: Negative for visual disturbance.  Respiratory: Negative for cough and shortness of breath.   Cardiovascular: Negative for chest pain, palpitations and leg swelling.  Gastrointestinal: Negative for vomiting.  Musculoskeletal: Negative for neck pain and neck stiffness.  Neurological: Negative for dizziness, seizures, speech difficulty, light-headedness, numbness and headaches.  Psychiatric/Behavioral: Positive for agitation and hallucinations.  All other systems reviewed and are negative.    Physical Exam Updated Vital Signs BP (!) 190/67   Pulse 62   Temp 99.1 F (37.3 C) (Oral)   Resp (!) 22   SpO2 92%   Physical Exam  Constitutional: She appears well-developed and well-nourished. No distress.  HENT:  Head: Normocephalic and atraumatic.  Mouth/Throat: Oropharynx is clear and moist. No oropharyngeal exudate.  Eyes: Pupils are equal, round, and reactive to light. Conjunctivae and EOM are normal.  Neck: Normal range of motion. Neck supple.  Cardiovascular: Normal rate, regular rhythm, normal heart sounds and intact distal pulses.  Pulmonary/Chest:  Effort normal and breath sounds normal. No stridor. She has no wheezes. She has no rales.  Abdominal: Soft. Bowel sounds are normal. She exhibits no mass. There is no tenderness. There is no rebound and no guarding.  Musculoskeletal: Normal range of motion.  Neurological: She is alert. She displays normal reflexes.  Left peripheral 7 palsy cannot wrinkle forehead or left left eyebrow, lid does not completely close.  Mild left nasal labial flattening  Skin: Skin is warm and dry. Capillary refill takes less than 2 seconds.  Psychiatric: She has a normal mood and affect.     ED Treatments / Results  Labs (all labs ordered are listed, but only abnormal results are displayed) Results for orders placed or performed during the hospital encounter of 05/12/18  CBC with Differential/Platelet  Result Value Ref Range   WBC 4.8 4.0 - 10.5 K/uL  RBC 3.71 (L) 3.87 - 5.11 MIL/uL   Hemoglobin 9.9 (L) 12.0 - 15.0 g/dL   HCT 40.9 (L) 81.1 - 91.4 %   MCV 86.3 78.0 - 100.0 fL   MCH 26.7 26.0 - 34.0 pg   MCHC 30.9 30.0 - 36.0 g/dL   RDW 78.2 95.6 - 21.3 %   Platelets 432 (H) 150 - 400 K/uL   Neutrophils Relative % 41 %   Neutro Abs 2.0 1.7 - 7.7 K/uL   Lymphocytes Relative 45 %   Lymphs Abs 2.2 0.7 - 4.0 K/uL   Monocytes Relative 9 %   Monocytes Absolute 0.5 0.1 - 1.0 K/uL   Eosinophils Relative 4 %   Eosinophils Absolute 0.2 0.0 - 0.7 K/uL   Basophils Relative 1 %   Basophils Absolute 0.1 0.0 - 0.1 K/uL  Urinalysis, Routine w reflex microscopic  Result Value Ref Range   Color, Urine YELLOW YELLOW   APPearance CLEAR CLEAR   Specific Gravity, Urine 1.011 1.005 - 1.030   pH 7.0 5.0 - 8.0   Glucose, UA NEGATIVE NEGATIVE mg/dL   Hgb urine dipstick NEGATIVE NEGATIVE   Bilirubin Urine NEGATIVE NEGATIVE   Ketones, ur NEGATIVE NEGATIVE mg/dL   Protein, ur NEGATIVE NEGATIVE mg/dL   Nitrite NEGATIVE NEGATIVE   Leukocytes, UA NEGATIVE NEGATIVE  I-Stat Chem 8, ED  Result Value Ref Range   Sodium 139  135 - 145 mmol/L   Potassium 3.5 3.5 - 5.1 mmol/L   Chloride 100 98 - 111 mmol/L   BUN 18 8 - 23 mg/dL   Creatinine, Ser 0.86 (H) 0.44 - 1.00 mg/dL   Glucose, Bld 94 70 - 99 mg/dL   Calcium, Ion 5.78 4.69 - 1.40 mmol/L   TCO2 26 22 - 32 mmol/L   Hemoglobin 10.2 (L) 12.0 - 15.0 g/dL   HCT 62.9 (L) 52.8 - 41.3 %  I-stat troponin, ED  Result Value Ref Range   Troponin i, poc 0.00 0.00 - 0.08 ng/mL   Comment 3           Dg Chest 2 View  Result Date: 05/12/2018 CLINICAL DATA:  Initial evaluation for acute altered mental status, congestion. EXAM: CHEST - 2 VIEW COMPARISON:  Prior radiograph from 05/06/2018. FINDINGS: Cardiomegaly, stable. Mediastinal silhouette normal. Tortuosity the intrathoracic aorta with aortic atherosclerosis noted. Lungs are mildly hypoinflated. Prominence of the right main pulmonary artery, which could reflect a degree of underlying pulmonary hypertension. Perihilar vascular congestion without overt pulmonary edema. No pleural effusion. No focal infiltrates. No pneumothorax. No acute osseus abnormality. Prominent degenerative changes noted about the shoulders. IMPRESSION: 1. Cardiomegaly with mild perihilar vascular congestion without overt pulmonary edema. Prominence of the right main pulmonary artery may reflect a degree of underlying pulmonary hypertension. 2. No other active cardiopulmonary disease. 3. Aortic atherosclerosis. Electronically Signed   By: Rise Mu M.D.   On: 05/12/2018 04:53   Dg Chest 2 View  Result Date: 04/26/2018 CLINICAL DATA:  Cough, congestion and low-grade fever EXAM: CHEST - 2 VIEW COMPARISON:  September 02, 2017 FINDINGS: The heart size and mediastinal contours are stable. The heart size is enlarged. The aorta is tortuous. There is enlargement of central pulmonary vessel unchanged. There is no focal pneumonia, pulmonary edema, or pleural effusion. The visualized skeletal structures are stable. IMPRESSION: No focal pneumonia. Cardiomegaly.  Enlargement of central pulmonary vessels unchanged compared prior exam. Electronically Signed   By: Sherian Rein M.D.   On: 04/26/2018 15:35   Ct Head Wo Contrast  Result Date: 05/12/2018 CLINICAL DATA:  Initial evaluation for acute left facial droop for 2 days. EXAM: CT HEAD WITHOUT CONTRAST TECHNIQUE: Contiguous axial images were obtained from the base of the skull through the vertex without intravenous contrast. COMPARISON:  Prior CT from 08/22/2013. FINDINGS: Brain: Examination degraded by motion artifact. Age-related cerebral atrophy with moderate chronic small vessel ischemic disease. No acute intracranial hemorrhage. No acute large vessel territory infarct. No mass lesion, midline shift or mass effect. No hydrocephalus. No definite extra-axial fluid collection. Vascular: No hyperdense vessel. Scattered vascular calcifications noted within the carotid siphons. Skull: Scalp soft tissues within normal limits.  Calvarium intact. Sinuses/Orbits: Globes and orbital soft tissues demonstrate no acute finding. Paranasal sinuses and mastoid air cells are grossly clear. Other: None. IMPRESSION: 1. Technically limited exam due to motion artifact. No definite acute intracranial abnormality identified. 2. Age-related cerebral atrophy with moderate chronic small vessel ischemic disease. Electronically Signed   By: Rise Mu M.D.   On: 05/12/2018 05:32   Dg Abd Acute W/chest  Result Date: 05/06/2018 CLINICAL DATA:  Nausea.  Rectal pain. EXAM: DG ABDOMEN ACUTE W/ 1V CHEST COMPARISON:  Chest radiographs, 04/26/2018. FINDINGS: Relative paucity of bowel gas. There is no bowel dilation to suggest obstruction. No air-fluid levels. No evidence of adynamic ileus. No free air. No evidence of renal or ureteral stones. Soft tissues are unremarkable other than aorta iliac vascular calcifications. Heart is mildly enlarged. No mediastinal or hilar masses. No evidence of pneumonia or pulmonary edema. Skeletal structures  are demineralized. No acute skeletal abnormality. IMPRESSION: 1. No acute findings. No evidence of bowel obstruction, generalized adynamic ileus or free air. 2. No acute cardiopulmonary disease. Electronically Signed   By: Amie Portland M.D.   On: 05/06/2018 11:21    EKG EKG Interpretation  Date/Time:  Friday May 12 2018 04:31:54 EDT Ventricular Rate:  62 PR Interval:    QRS Duration: 95 QT Interval:  430 QTC Calculation: 437 R Axis:   42 Text Interpretation:  Sinus rhythm Confirmed by Telly Jawad ([id number]) on 05/12/2018 4:53:30 AM   Radiology Dg Chest 2 View  Result Date: 05/12/2018 CLINICAL DATA:  Initial evaluation for acute altered mental status, congestion. EXAM: CHEST - 2 VIEW COMPARISON:  Prior radiograph from 05/06/2018. FINDINGS: Cardiomegaly, stable. Mediastinal silhouette normal. Tortuosity the intrathoracic aorta with aortic atherosclerosis noted. Lungs are mildly hypoinflated. Prominence of the right main pulmonary artery, which could reflect a degree of underlying pulmonary hypertension. Perihilar vascular congestion without overt pulmonary edema. No pleural effusion. No focal infiltrates. No pneumothorax. No acute osseus abnormality. Prominent degenerative changes noted about the shoulders. IMPRESSION: 1. Cardiomegaly with mild perihilar vascular congestion without overt pulmonary edema. Prominence of the right main pulmonary artery may reflect a degree of underlying pulmonary hypertension. 2. No other active cardiopulmonary disease. 3. Aortic atherosclerosis. Electronically Signed   By: Rise Mu M.D.   On: 05/12/2018 04:53   Ct Head Wo Contrast  Result Date: 05/12/2018 CLINICAL DATA:  Initial evaluation for acute left facial droop for 2 days. EXAM: CT HEAD WITHOUT CONTRAST TECHNIQUE: Contiguous axial images were obtained from the base of the skull through the vertex without intravenous contrast. COMPARISON:  Prior CT from 08/22/2013. FINDINGS: Brain:  Examination degraded by motion artifact. Age-related cerebral atrophy with moderate chronic small vessel ischemic disease. No acute intracranial hemorrhage. No acute large vessel territory infarct. No mass lesion, midline shift or mass effect. No hydrocephalus. No definite extra-axial fluid collection. Vascular: No hyperdense vessel. Scattered vascular calcifications noted  within the carotid siphons. Skull: Scalp soft tissues within normal limits.  Calvarium intact. Sinuses/Orbits: Globes and orbital soft tissues demonstrate no acute finding. Paranasal sinuses and mastoid air cells are grossly clear. Other: None. IMPRESSION: 1. Technically limited exam due to motion artifact. No definite acute intracranial abnormality identified. 2. Age-related cerebral atrophy with moderate chronic small vessel ischemic disease. Electronically Signed   By: Rise Mu M.D.   On: 05/12/2018 05:32    Procedures Procedures (including critical care time)  Medications Ordered in ED Medications - No data to display     Final Clinical Impressions(s) / ED Diagnoses   Final diagnoses:  Bell's palsy  Lewy body dementia with behavioral disturbance (HCC)   Symptoms consistent with bells palsy.    Return for weakness, numbness, changes in vision or speech, fevers >100.4 unrelieved by medication, shortness of breath, intractable vomiting, or diarrhea, abdominal pain, Inability to tolerate liquids or food, cough, altered mental status or any concerns. No signs of systemic illness or infection. The patient is nontoxic-appearing on exam and vital signs are within normal limits.    I have reviewed the triage vital signs and the nursing notes. Pertinent labs &imaging results that were available during my care of the patient were reviewed by me and considered in my medical decision making (see chart for details).  After history, exam, and medical workup I feel the patient has been appropriately medically screened  and is safe for discharge home. Pertinent diagnoses were discussed with the patient. Patient was given return precautions.     ED Discharge Orders         Ordered    predniSONE (DELTASONE) 20 MG tablet     05/12/18 0556           Claire Dolores, MD 05/12/18 4098

## 2018-05-15 ENCOUNTER — Telehealth: Payer: Self-pay

## 2018-05-15 ENCOUNTER — Other Ambulatory Visit: Payer: Self-pay | Admitting: Neurology

## 2018-05-15 NOTE — Telephone Encounter (Signed)
Rx registry checked. Last fill date is 04/10/18 for #30. Last seen on 01/19/18.

## 2018-05-15 NOTE — Telephone Encounter (Signed)
Returned the pt's daughter Mrs. Watington and left a message that the appt for oct 1st has been cancelled.

## 2018-05-16 ENCOUNTER — Ambulatory Visit: Payer: Medicare Other | Admitting: Internal Medicine

## 2018-05-22 ENCOUNTER — Telehealth: Payer: Self-pay

## 2018-05-22 NOTE — Telephone Encounter (Signed)
Phone call placed to patient's daughter to introduce palliative care and to schedule visit with NP. VM left with call back information

## 2018-05-22 NOTE — Telephone Encounter (Signed)
Phone call placed to patient's daughter at alternative number. Not able to leave message as mailbox is full.

## 2018-05-22 NOTE — Telephone Encounter (Signed)
Palliative care visit scheduled for 05/23/18

## 2018-05-23 ENCOUNTER — Other Ambulatory Visit: Payer: Medicare Other | Admitting: Nurse Practitioner

## 2018-05-23 ENCOUNTER — Encounter: Payer: Self-pay | Admitting: Nurse Practitioner

## 2018-05-23 DIAGNOSIS — F0281 Dementia in other diseases classified elsewhere with behavioral disturbance: Secondary | ICD-10-CM

## 2018-05-23 DIAGNOSIS — R630 Anorexia: Secondary | ICD-10-CM

## 2018-05-23 DIAGNOSIS — F22 Delusional disorders: Secondary | ICD-10-CM

## 2018-05-23 DIAGNOSIS — G3183 Dementia with Lewy bodies: Secondary | ICD-10-CM

## 2018-05-23 DIAGNOSIS — R269 Unspecified abnormalities of gait and mobility: Secondary | ICD-10-CM

## 2018-05-23 DIAGNOSIS — R441 Visual hallucinations: Secondary | ICD-10-CM

## 2018-05-23 DIAGNOSIS — Z515 Encounter for palliative care: Secondary | ICD-10-CM

## 2018-05-23 NOTE — Progress Notes (Signed)
PALLIATIVE CARE CONSULT VISIT   PATIENT NAME: Connie Poole DOB: 03-Jan-1921 MRN: [id number]  PRIMARY CARE PROVIDER:   Dorothyann Peng, MD  REFERRING PROVIDER:  Dorothyann Peng, MD [address] Connie Poole, [place] [address]  RESPONSIBLE PARTY:   Connie Poole (daughter) [phone]    HISTORY OF PRESENT ILLNESS:  Connie Poole is a 84 y.o. year old female with multiple medical problems including Lewy body dementia with behavioral disturbance, gait disorder, hypertension . Palliative Care was asked to help with symptom management and to  address goals of care.   Patient lives with her daughter.   ROS/Functional status:Requires assistance as times getting out of bed and chair. Requires 100% assistance with all ADL's. Daughter reports that patient is not as alert and has approx 2-3 "good days a week"; appetite is inconsistent; overall has decreased, along with decreased fluid intake. has hallucinations and delusions that have increased intensity since her last OV with PCP; she is harder to calm down and hallucinations last longer . sleeps is intermittent will wake up at times with agitation ; pulled clothes; off; would not get n the bed because "something sticking in her back. Is mainly continent of bowel and bladder, has Constipation  IMPRESSION/RECOMMENDATIONS and PLAN:  Lewy Body dementia with behavioral disturbance (hallucinations, delusions) -sleep disturbance -Gait disorder -Evaluated by [facility] Neurology -increase in frequency, and intensity of hallucinations -klonopin at bedtime -Consider adding low dose Quetiapine at night initially 12.5mg  qhs for hallucinations and sleep  -patient noted to be very stiff; required nearly 100% assistance of daughter and this Clinical research associate to to transfer from chair to bed -receiving physical therapy  HTN  DM sarcodosis CHF CKD -Continue current regimen as prescribed by primary PCP  -ACP DNR-comfort/MOST; no antibiotic's no IVF no feeding  tube per Daughter Connie Poole is HCPOA      I spent 60 minutes providing this consultation,  from 13:00 to 14:00. More than 50% of the time in this consultation was spent coordinating communication.    CODE STATUS: DNR-comfort; no antibiotic's, no IVF's no feeding tube  PPS: weak 40% HOSPICE ELIGIBILITY/DIAGNOSIS: TBD  PAST MEDICAL HISTORY:  Past Medical History:  Diagnosis Date  . Arthritis   . CHF (congestive heart failure) (HCC)   . Dementia   . Diabetes mellitus   . Glaucoma   . Hyperlipidemia   . Hypertension   . Sarcoidosis     SOCIAL HX:  Social History   Tobacco Use  . Smoking status: Never Smoker  . Smokeless tobacco: Never Used  Substance Use Topics  . Alcohol use: No    Alcohol/week: 0.0 standard drinks    ALLERGIES:  Allergies  Allergen Reactions  . Celebrex [Celecoxib] Nausea And Vomiting  . Vioxx [Rofecoxib] Nausea And Vomiting     PERTINENT MEDICATIONS:  Outpatient Encounter Medications as of 05/23/2018  Medication Sig  . ACCU-CHEK COMPACT TEST DRUM test strip   . ACCU-CHEK SOFTCLIX LANCETS lancets ONE STEP  . aspirin 81 MG tablet Take 81 mg by mouth daily.  Marland Kitchen atorvastatin (LIPITOR) 40 MG tablet Take 40 mg by mouth daily.  . brimonidine (ALPHAGAN P) 0.1 % SOLN Apply 1 drop to eye every 8 (eight) hours.   . Calcium Carbonate-Vitamin D (CALCIUM 600+D PO) Take 1 tablet by mouth daily.   . carvedilol (COREG) 25 MG tablet Take 25 mg by mouth 2 (two) times daily with a meal.  . cetirizine (ZYRTEC) 10 MG tablet Take 10 mg by mouth daily.  Marland Kitchen  Cholecalciferol (D3-50 PO) Take 50 mg by mouth daily.  . clonazePAM (KLONOPIN) 0.5 MG tablet TAKE 1/2 TO 1 TABLET BY MOUTH AT BEDTIME AS NEEDED  . donepezil (ARICEPT) 5 MG tablet Take 5 mg by mouth every evening.  . dorzolamide (TRUSOPT) 2 % ophthalmic solution 1 drop 3 (three) times daily.  . furosemide (LASIX) 40 MG tablet Take 40 mg by mouth daily.   Marland Kitchen ipratropium (ATROVENT) 0.06 % nasal spray Place 2 sprays into  both nostrils 2 (two) times daily.  Marland Kitchen KLOR-CON M20 20 MEQ tablet Take 20 mEq by mouth daily.   Marland Kitchen LUMIGAN 0.01 % SOLN Apply 1 drop to eye daily.  . Multiple Vitamin (MULTIVITAMIN WITH MINERALS) TABS tablet Take 1 tablet by mouth daily.  . Multiple Vitamins-Minerals (PRESERVISION AREDS 2 PO) Take 1 tablet by mouth daily.  . Omega-3 Fatty Acids (FISH OIL) 1000 MG CAPS Take 1,000 mg by mouth daily.  . predniSONE (DELTASONE) 20 MG tablet 3 tabs po day one, then 2 po daily x 4 days  . sertraline (ZOLOFT) 50 MG tablet Take 50 mg by mouth daily.  Marland Kitchen telmisartan (MICARDIS) 80 MG tablet Take 80 mg by mouth daily.  . Turmeric ([employer]S TURMERIC CURCUMIN) 500 MG CAPS Take 1 tablet by mouth daily.   No facility-administered encounter medications on file as of 05/23/2018.     PHYSICAL EXAM:   General:slightly obese female in NAD Cardiovascular: regular rate and rhythm Pulmonary: clear ant fields Abdomen: soft, nontender, + bowel sounds GU: no suprapubic tenderness Extremities: 2+ bilateral LE  edema, no joint deformities Skin: no rashes Neurological: Generalized Weakness; required 2 person assistance to transfer from chair to bed; patient was not able to stand; Has right lower facial droop from bell's palsy.  Connie Ronda G [place], NP

## 2018-05-25 ENCOUNTER — Encounter: Payer: Self-pay | Admitting: Nurse Practitioner

## 2018-05-31 ENCOUNTER — Telehealth: Payer: Self-pay

## 2018-05-31 NOTE — Telephone Encounter (Signed)
Received phone call from patient's daughter to schedule follow up visit with Palliative Care. Visit scheduled.

## 2018-06-01 ENCOUNTER — Encounter: Payer: Self-pay | Admitting: Adult Health

## 2018-06-01 ENCOUNTER — Telehealth: Payer: Self-pay | Admitting: Adult Health

## 2018-06-01 NOTE — Telephone Encounter (Signed)
Spoke with daughter, Dois Davenport and asked if patient is taking 1/5 or 1 tab nightly of clonazepam. Dois Davenport stated she had increased to 1 tab but then took patient to ED. She stated the ED dr told her clonazepam may be causing increased  weakness, so Dois Davenport cut her back to 1/2 tab. Dois Davenport stated she did not notice improvement in night time for patient while on whole tablet. She stated she wakes often, may be awake for a while. She'll call out or yell during the night which is disturbing to patient and family.  Dois Davenport is asking if a different medication can be prescribed to help patient sleep better. This RN advised will discuss with NP and let her know. She verbalized understanding, appreciation.

## 2018-06-01 NOTE — Telephone Encounter (Signed)
Pt daughter(on DPR-Wallington,[name])has called to say she does not feel that theclonazePAM (KLONOPIN) 0.5 MG tablet is helping pt. She is asking for a call to discuss going on another medication to help pt sleep at night.  Please call

## 2018-06-01 NOTE — Telephone Encounter (Signed)
I called the patient's daughter left a message for her to call the office.

## 2018-06-02 NOTE — Telephone Encounter (Signed)
Pt daughter has returned the call to NP [facility], she is asking for a call back on Monday.  Daughter states if she is not reached on cell to please call house# [phone]

## 2018-06-05 NOTE — Telephone Encounter (Signed)
I called the patient's daughter.  Daughter reports that the patient typically takes Klonopin half a tablet at 7:30 PM but she does not get in bed until 10 PM.  She is normally back up at 1 AM and then is up and down the rest of the night.  She reports that she does doze off during the day.  She typically does not have any hallucinations during the day but rather during the night.  She states that she was taking 1 tablet of the Klonopin but was told by ED physician that this may be the cause of her balance issues.  I advised patient that it is okay to increase to 1 tablet of klonopin. the patient should only take this at bedtime.  I did advise that if she gets up during the night her daughter needs to be there with her to prevent falls.  Daughter reports understanding.  She reports that typically her mom does not get out of bed without her there.  If this is not beneficial she will call back in 2 weeks and let us know.  Also suggested that a an adult day center may be beneficial to engage the patient in activity as well as give the daughter a break.  She voiced understanding.

## 2018-06-18 ENCOUNTER — Encounter: Payer: Self-pay | Admitting: Internal Medicine

## 2018-06-21 ENCOUNTER — Other Ambulatory Visit: Payer: Self-pay | Admitting: Internal Medicine

## 2018-07-02 ENCOUNTER — Other Ambulatory Visit: Payer: Self-pay | Admitting: Internal Medicine

## 2018-07-04 ENCOUNTER — Encounter: Payer: Self-pay | Admitting: Internal Medicine

## 2018-07-04 ENCOUNTER — Other Ambulatory Visit: Payer: Medicare Other | Admitting: Internal Medicine

## 2018-07-04 DIAGNOSIS — R2689 Other abnormalities of gait and mobility: Secondary | ICD-10-CM

## 2018-07-04 DIAGNOSIS — G3183 Dementia with Lewy bodies: Secondary | ICD-10-CM

## 2018-07-04 DIAGNOSIS — R058 Other specified cough: Secondary | ICD-10-CM

## 2018-07-04 DIAGNOSIS — F0280 Dementia in other diseases classified elsewhere without behavioral disturbance: Secondary | ICD-10-CM

## 2018-07-04 DIAGNOSIS — R05 Cough: Secondary | ICD-10-CM

## 2018-07-04 NOTE — Progress Notes (Signed)
[facility] Telephone: [phone] Fax: [phone]  PATIENT NAME: Connie Poole DOB: 05/11/1921 MRN: [id number]  PRIMARY CARE PROVIDER:   Dorothyann PengSanders, Robyn, MD  REFERRING PROVIDER:  Dorothyann PengSanders, Robyn, MD [address] [place], [place] [address]  RESPONSIBLE PARTY:   Connie Poole (daughter) [phone]  INTERVAL NOTE: [name] with h/o Lewy Body dementia with behavioral disturbances, HTN, DM, and CHF. Routine Palliative Care follow up to assist with symptom management.  1. Increased mucus production with cough. Cough of thin white secretions. Lung fields are clear. Daughter had been administering guaifenesin bid. Patient drinking adequate amounts of fluid.  -I think the guaifenesin is contributing to the excess secretions. Recommended decrease frequency to qd prn.   2. Dementia with behavioral disturbances: Daughter reports patient has been stable on current regimen of Klonopin 0.5mg  bid, and Zoloft 50mg  qd. Reports that patient has had a brighter affect as of late. Patient is pleasantly conversant. A and O to self and place. She is conitinent of bowel and bladder, and is able to feed herself. Her daughter knows that there is the option of tapping into use of low dose Seroquel should there be future need.  3. Sedentary due to deconditioning and arthritic joint pain (knees). Adequate pain management on Tylenol 500mg   Up to 4 tabs/day, and tumeric. We discussed avoiding more than 4 Tylenol tabs a day due to potential liver toxicity. She is a one person assist to stand; can pivot with slow shuffling gait.   4. CHF: Compensated by exam; lung fields clear. LE edema which daughter states is much improved from over the summer, when legs were weeping. Her BP is elevated today but her previous readings hover around 130's systolic. HR marginally low in the 50's, but again is stable. On Coreg and Lasix.  I spent 25 minutes providing this consultation, from 1pm to  1:25pm. More than 50% of the time in this consultation was spent coordinating communication.   CODE STATUS: DNR-comfort; no antibiotic's, no IVF's no feeding tube  PPS: 30% HOSPICE ELIGIBILITY/DIAGNOSIS: TBD  PAST MEDICAL HISTORY:  Past Medical History:  Diagnosis Date  . Arthritis   . CHF (congestive heart failure) (HCC)   . Dementia (HCC)   . Diabetes mellitus   . Glaucoma   . Hyperlipidemia   . Hypertension   . Sarcoidosis     SOCIAL HX:  Social History   Tobacco Use  . Smoking status: Never Smoker  . Smokeless tobacco: Never Used  Substance Use Topics  . Alcohol use: No    Alcohol/week: 0.0 standard drinks    ALLERGIES:  Allergies  Allergen Reactions  . Celebrex [Celecoxib] Nausea And Vomiting  . Vioxx [Rofecoxib] Nausea And Vomiting     PERTINENT MEDICATIONS:  Outpatient Encounter Medications as of 07/04/2018  Medication Sig  . ACCU-CHEK COMPACT TEST DRUM test strip   . ACCU-CHEK SOFTCLIX LANCETS lancets ONE STEP  . aspirin 81 MG tablet Take 81 mg by mouth daily.  Marland Kitchen. atorvastatin (LIPITOR) 40 MG tablet 07/18/17 TAKE 1 TABLET BY MOUTH AT BEDTIME (Patient taking differently: No sig reported)  . brimonidine (ALPHAGAN P) 0.1 % SOLN Apply 1 drop to eye every 8 (eight) hours.   . Calcium Carbonate-Vitamin D (CALCIUM 600+D PO) Take 1 tablet by mouth daily.   . carvedilol (COREG) 25 MG tablet Take 25 mg by mouth 2 (two) times daily with a meal.  . Cholecalciferol (D3-50 PO) Take 50 mg by mouth daily.  . clonazePAM (  KLONOPIN) 0.5 MG tablet TAKE 1/2 TO 1 TABLET BY MOUTH AT BEDTIME AS NEEDED  . donepezil (ARICEPT) 5 MG tablet Take 5 mg by mouth every evening.  . dorzolamide (TRUSOPT) 2 % ophthalmic solution 1 drop 3 (three) times daily.  . furosemide (LASIX) 40 MG tablet Take 40 mg by mouth daily.   Marland Kitchen ipratropium (ATROVENT) 0.06 % nasal spray Place 2 sprays into both nostrils 2 (two) times daily.  Marland Kitchen KLOR-CON M20 20 MEQ tablet Take 20 mEq by mouth daily.   Marland Kitchen LUMIGAN 0.01 %  SOLN Apply 1 drop to eye daily.  . Multiple Vitamin (MULTIVITAMIN WITH MINERALS) TABS tablet Take 1 tablet by mouth daily.  . Multiple Vitamins-Minerals (PRESERVISION AREDS 2 PO) Take 1 tablet by mouth daily.  . Omega-3 Fatty Acids (FISH OIL) 1000 MG CAPS Take 1,000 mg by mouth daily.  . sertraline (ZOLOFT) 50 MG tablet Take 50 mg by mouth daily.  Marland Kitchen telmisartan (MICARDIS) 80 MG tablet TAKE 1 TABLET BY MOUTH EVERY DAY  . Turmeric ([employer]S TURMERIC CURCUMIN) 500 MG CAPS Take 1 tablet by mouth daily.  . cetirizine (ZYRTEC) 10 MG tablet Take 10 mg by mouth daily.  Marland Kitchen nystatin (MYCOSTATIN) 100000 UNIT/ML suspension Take 5 mLs by mouth 4 (four) times daily.  Marland Kitchen nystatin (NYSTATIN) powder Apply topically 4 (four) times daily.  . predniSONE (DELTASONE) 20 MG tablet 3 tabs po day one, then 2 po daily x 4 days (Patient not taking: Reported on 07/04/2018)   No facility-administered encounter medications on file as of 07/04/2018.     PHYSICAL EXAM:  VS; BP 162/72, HR 56, RR 16 General: Well nourished, cooperative, pleasantly conversant Cardiovascular: regular rate and rhythm, systolic murmur Pulmonary: clear ant fields Abdomen: soft, nontender, + bowel sounds GU: no suprapubic tenderness Extremities: bilateral LE edema to mid calves Skin: no rashes Neurological: Weakness but otherwise nonfocal  Anselm Lis, NP

## 2018-07-16 ENCOUNTER — Other Ambulatory Visit: Payer: Self-pay | Admitting: Neurology

## 2018-07-17 ENCOUNTER — Other Ambulatory Visit: Payer: Self-pay | Admitting: Neurology

## 2018-07-17 MED ORDER — CLONAZEPAM 0.5 MG PO TABS: 0.2500 mg | ORAL_TABLET | Freq: Every evening | ORAL | 0 refills | Status: DC | PRN

## 2018-07-31 ENCOUNTER — Ambulatory Visit: Payer: Medicare Other | Admitting: Adult Health

## 2018-08-01 ENCOUNTER — Other Ambulatory Visit: Payer: Medicare Other | Admitting: Nurse Practitioner

## 2018-08-01 ENCOUNTER — Encounter: Payer: Self-pay | Admitting: Adult Health

## 2018-08-01 DIAGNOSIS — G3183 Dementia with Lewy bodies: Secondary | ICD-10-CM

## 2018-08-01 DIAGNOSIS — R269 Unspecified abnormalities of gait and mobility: Secondary | ICD-10-CM

## 2018-08-01 DIAGNOSIS — Z515 Encounter for palliative care: Secondary | ICD-10-CM

## 2018-08-01 DIAGNOSIS — R2689 Other abnormalities of gait and mobility: Secondary | ICD-10-CM

## 2018-08-01 DIAGNOSIS — R441 Visual hallucinations: Secondary | ICD-10-CM

## 2018-08-01 DIAGNOSIS — F22 Delusional disorders: Secondary | ICD-10-CM

## 2018-08-01 DIAGNOSIS — F0280 Dementia in other diseases classified elsewhere without behavioral disturbance: Secondary | ICD-10-CM

## 2018-08-02 ENCOUNTER — Encounter: Payer: Self-pay | Admitting: Nurse Practitioner

## 2018-08-02 NOTE — Progress Notes (Signed)
pls call Mrs. Wallington - how does she feel about starting new medication to help her Mom sleep? I think it is okay to give it a try.   Also, I am okay with weaning her off of the cholesterol medication. She should take MWF until she runs out.

## 2018-08-02 NOTE — Progress Notes (Signed)
PALLIATIVE CARE CONSULT VISIT   PATIENT NAME: Connie Poole DOB: January 29, 1921 MRN: [id number]  PRIMARY CARE PROVIDER:   Dorothyann PengSanders, Robyn, MD  REFERRING PROVIDER:  Dorothyann PengSanders, Robyn, MD [address] [place], [place] [address]  RESPONSIBLE PARTY:   Rosette RevealSandra Wallington (daughter) [phone]    HISTORY OF PRESENT ILLNESS:  Connie Poole is a 84 y.o. year old female with multiple medical problems including Lewy body dementia with behavioral disturbance, gait disorder, hypertension . Palliative Care was asked to help with symptom management and to  address goals of care.   Collateral information by daughter Dois DavenportSandra: patient continues to require 100% assistance with all ADL's; still has "good days when she is more alert, talkative and interactive alternating with and "bad days" when is not as alert; is incontinent of bowel and bladder  IMPRESSION/PALN/RECOMMENDATIONS Lewy Body dementia with behavioral disturbance (hallucinations, delusions) -sleep disturbance -Gait disorder -[facility] 6E [facility] -increase in frequency, and intensity of hallucinations -continue klonopin at bedtime  -Consider adding low dose Quetiapine  at night initially 12.5mg  qhs for hallucinations and sleep   HTN HLD  DM sarcodosis CHF CKD -Continue current regimen as prescribed by primary PCP -consider d/cing statin given patient's advanced and and benefit vs risk factor  -ACP DNR-comfort/MOST; no antibiotic's no IVF no feeding tube per Daughter Nilda CalamitySandrawho is HCPOA    I spent 30 minutes providing this consultation,  from 13:00 to 13:00. More than 50% of the time in this consultation was spent coordinating communication.   CODE STATUS: DNR-comfort; no antibiotic's, no IVF's no feeding tube  PPS: weak 40% HOSPICE ELIGIBILITY/DIAGNOSIS: TBD  PAST MEDICAL HISTORY:  Past Medical History:  Diagnosis Date  . Arthritis   . CHF (congestive heart failure) (HCC)   . Dementia (HCC)   .  Diabetes mellitus   . Glaucoma   . Hyperlipidemia   . Hypertension   . Sarcoidosis     SOCIAL HX:  Social History   Tobacco Use  . Smoking status: Never Smoker  . Smokeless tobacco: Never Used  Substance Use Topics  . Alcohol use: No    Alcohol/week: 0.0 standard drinks    ALLERGIES:  Allergies  Allergen Reactions  . Celebrex [Celecoxib] Nausea And Vomiting  . Vioxx [Rofecoxib] Nausea And Vomiting     PERTINENT MEDICATIONS:  Outpatient Encounter Medications as of 08/01/2018  Medication Sig  . ACCU-CHEK COMPACT TEST DRUM test strip   . ACCU-CHEK SOFTCLIX LANCETS lancets ONE STEP  . aspirin 81 MG tablet Take 81 mg by mouth daily.  Marland Kitchen. atorvastatin (LIPITOR) 40 MG tablet 07/18/17 TAKE 1 TABLET BY MOUTH AT BEDTIME (Patient taking differently: No sig reported)  . brimonidine (ALPHAGAN P) 0.1 % SOLN Apply 1 drop to eye every 8 (eight) hours.   . Calcium Carbonate-Vitamin D (CALCIUM 600+D PO) Take 1 tablet by mouth daily.   . carvedilol (COREG) 25 MG tablet Take 25 mg by mouth 2 (two) times daily with a meal.  . cetirizine (ZYRTEC) 10 MG tablet Take 10 mg by mouth daily.  . Cholecalciferol (D3-50 PO) Take 50 mg by mouth daily.  . clonazePAM (KLONOPIN) 0.5 MG tablet Take 0.5-1 tablets (0.25-0.5 mg total) by mouth at bedtime as needed.  . donepezil (ARICEPT) 5 MG tablet Take 5 mg by mouth every evening.  . dorzolamide (TRUSOPT) 2 % ophthalmic solution 1 drop 3 (three) times daily.  . furosemide (LASIX) 40 MG tablet Take 40 mg by mouth daily.   Marland Kitchen. ipratropium (  ATROVENT) 0.06 % nasal spray Place 2 sprays into both nostrils 2 (two) times daily.  Marland Kitchen KLOR-CON M20 20 MEQ tablet Take 20 mEq by mouth daily.   Marland Kitchen LUMIGAN 0.01 % SOLN Apply 1 drop to eye daily.  . Multiple Vitamin (MULTIVITAMIN WITH MINERALS) TABS tablet Take 1 tablet by mouth daily.  . Multiple Vitamins-Minerals (PRESERVISION AREDS 2 PO) Take 1 tablet by mouth daily.  Marland Kitchen nystatin (MYCOSTATIN) 100000 UNIT/ML suspension Take 5 mLs by  mouth 4 (four) times daily.  Marland Kitchen nystatin (NYSTATIN) powder Apply topically 4 (four) times daily.  . Omega-3 Fatty Acids (FISH OIL) 1000 MG CAPS Take 1,000 mg by mouth daily.  . predniSONE (DELTASONE) 20 MG tablet 3 tabs po day one, then 2 po daily x 4 days (Patient not taking: Reported on 07/04/2018)  . sertraline (ZOLOFT) 50 MG tablet Take 50 mg by mouth daily.  Marland Kitchen telmisartan (MICARDIS) 80 MG tablet TAKE 1 TABLET BY MOUTH EVERY DAY  . Turmeric (CVS TURMERIC CURCUMIN) 500 MG CAPS Take 1 tablet by mouth daily.   No facility-administered encounter medications on file as of 08/01/2018.     PHYSICAL EXAM:   General:slightly obese female in NAD Cardiovascular: regular rate and rhythm Pulmonary: BBS CTA; resp even and unlabored Abdomen: soft, nontender, + bowel sounds GU: no suprapubic tenderness Extremities: 2+ bilateral LE  edema, no joint deformities Skin: no rashes of exposed skin Neurological: alert and interactive ; oriented to self only    G [place], NP

## 2018-08-04 ENCOUNTER — Telehealth: Payer: Self-pay

## 2018-08-04 NOTE — Telephone Encounter (Signed)
-----   Message from Dorothyann Pengobyn Sanders, MD sent at 08/02/2018  1:34 PM EST -----   ----- Message ----- From: SwazilandJordan, Stephanie G, NP Sent: 08/02/2018   6:00 AM EST To: Dorothyann Pengobyn Sanders, MD  Palliative Care note: please see recommendations

## 2018-08-04 NOTE — Telephone Encounter (Signed)
Left a message for the pt's daughter Mrs. Wallington to call back.  - how does she feel about starting new medication to help her Mom sleep? I think it is okay to give it a try.   Also, I am okay with weaning her off of the cholesterol medication. She should take MWF until she runs out.

## 2018-08-23 ENCOUNTER — Other Ambulatory Visit: Payer: Self-pay | Admitting: Neurology

## 2018-08-23 MED ORDER — CLONAZEPAM 0.5 MG PO TABS: 0.2500 mg | ORAL_TABLET | Freq: Every evening | ORAL | 5 refills | Status: DC | PRN

## 2018-08-31 DIAGNOSIS — H548 Legal blindness, as defined in USA: Secondary | ICD-10-CM

## 2018-08-31 DIAGNOSIS — M6281 Muscle weakness (generalized): Secondary | ICD-10-CM

## 2018-08-31 DIAGNOSIS — I129 Hypertensive chronic kidney disease with stage 1 through stage 4 chronic kidney disease, or unspecified chronic kidney disease: Secondary | ICD-10-CM

## 2018-08-31 DIAGNOSIS — F0390 Unspecified dementia without behavioral disturbance: Secondary | ICD-10-CM

## 2018-08-31 DIAGNOSIS — F330 Major depressive disorder, recurrent, mild: Secondary | ICD-10-CM

## 2018-08-31 DIAGNOSIS — Z9181 History of falling: Secondary | ICD-10-CM

## 2018-08-31 DIAGNOSIS — Z6841 Body Mass Index (BMI) 40.0 and over, adult: Secondary | ICD-10-CM

## 2018-08-31 DIAGNOSIS — E1122 Type 2 diabetes mellitus with diabetic chronic kidney disease: Secondary | ICD-10-CM

## 2018-08-31 DIAGNOSIS — N183 Chronic kidney disease, stage 3 (moderate): Secondary | ICD-10-CM

## 2018-08-31 DIAGNOSIS — R2689 Other abnormalities of gait and mobility: Secondary | ICD-10-CM

## 2018-09-03 ENCOUNTER — Other Ambulatory Visit: Payer: Self-pay | Admitting: Internal Medicine

## 2018-09-12 ENCOUNTER — Other Ambulatory Visit: Payer: Medicare Other | Admitting: Nurse Practitioner

## 2018-09-12 DIAGNOSIS — G3183 Dementia with Lewy bodies: Secondary | ICD-10-CM

## 2018-09-12 DIAGNOSIS — R2689 Other abnormalities of gait and mobility: Secondary | ICD-10-CM

## 2018-09-12 DIAGNOSIS — R269 Unspecified abnormalities of gait and mobility: Secondary | ICD-10-CM

## 2018-09-12 DIAGNOSIS — F0280 Dementia in other diseases classified elsewhere without behavioral disturbance: Secondary | ICD-10-CM

## 2018-09-12 DIAGNOSIS — Z515 Encounter for palliative care: Secondary | ICD-10-CM

## 2018-09-13 ENCOUNTER — Encounter: Payer: Self-pay | Admitting: Nurse Practitioner

## 2018-09-13 NOTE — Progress Notes (Signed)
PALLIATIVE CARE CONSULT VISIT   PATIENT NAME: Connie Poole DOB: September 17, 1920 MRN: [id number]  PRIMARY CARE PROVIDER:   Dorothyann Peng, MD  REFERRING PROVIDER:  Dorothyann Peng, MD [address] [place], [place] [address]  RESPONSIBLE PARTY:   Rosette Reveal (daughter) [phone]    HISTORY OF PRESENT ILLNESS:  Connie Poole is a 85 y.o. year old female with multiple medical problems including Lewy body dementia with behavioral disturbance, gait disorder, hypertension . Palliative Care was asked to help with symptom management and to  address goals of care.   Collateral information by patient and [name]: patient continues to require 100% assistance with all ADL's; still has "good days when she is more alert, talkative and interactive alternating with and "bad days" when is not as alert; is incontinent of bowel and bladder; has chronic constipation; able to ambulate with walker to bathroom;appetite good; weight stable  IMPRESSION/PALN/RECOMMENDATIONS Lewy Body dementia with behavioral disturbance (hallucinations, delusions) -sleep disturbance -Gait disorder -FAST 6E Evaluated by [facility] -increase in frequency, and intensity of hallucinations -continue klonopin at bedtime  -Consider adding low dose Quetiapine  at night initially 12.5mg  qhs for hallucinations and sleep   HTN HLD  DM sarcodosis CHF CKD -Continue current regimen as prescribed by primary PCP -consider d/cing statin given patient's advanced and and benefit vs risk factor  -ACP DNR-comfort/MOST; no antibiotic's no IVF no feeding tube per Daughter Connie Poole is HCPOA    I spent 30 minutes providing this consultation,  from 15:00 to 15:30. More than 50% of the time in this consultation was spent coordinating communication.   CODE STATUS: DNR-comfort; no antibiotic's, no IVF's no feeding tube  PPS: weak 40% HOSPICE ELIGIBILITY/DIAGNOSIS: TBD  PAST MEDICAL HISTORY:  Past Medical  History:  Diagnosis Date  . Arthritis   . CHF (congestive heart failure) (HCC)   . Dementia (HCC)   . Diabetes mellitus   . Glaucoma   . Hyperlipidemia   . Hypertension   . Sarcoidosis     SOCIAL HX:  Social History   Tobacco Use  . Smoking status: Never Smoker  . Smokeless tobacco: Never Used  Substance Use Topics  . Alcohol use: No    Alcohol/week: 0.0 standard drinks    ALLERGIES:  Allergies  Allergen Reactions  . Celebrex [Celecoxib] Nausea And Vomiting  . Vioxx [Rofecoxib] Nausea And Vomiting     PERTINENT MEDICATIONS:  Outpatient Encounter Medications as of 09/12/2018  Medication Sig  . ACCU-CHEK COMPACT TEST DRUM test strip   . ACCU-CHEK SOFTCLIX LANCETS lancets ONE STEP  . aspirin 81 MG tablet Take 81 mg by mouth daily.  Marland Kitchen atorvastatin (LIPITOR) 40 MG tablet 07/18/17 TAKE 1 TABLET BY MOUTH AT BEDTIME (Patient taking differently: No sig reported)  . brimonidine (ALPHAGAN P) 0.1 % SOLN Apply 1 drop to eye every 8 (eight) hours.   . Calcium Carbonate-Vitamin D (CALCIUM 600+D PO) Take 1 tablet by mouth daily.   . carvedilol (COREG) 25 MG tablet Take 25 mg by mouth 2 (two) times daily with a meal.  . cetirizine (ZYRTEC) 10 MG tablet Take 10 mg by mouth daily.  . Cholecalciferol (D3-50 PO) Take 50 mg by mouth daily.  . clonazePAM (KLONOPIN) 0.5 MG tablet Take 0.5-1 tablets (0.25-0.5 mg total) by mouth at bedtime as needed.  . donepezil (ARICEPT) 5 MG tablet Take 5 mg by mouth every evening.  . dorzolamide (TRUSOPT) 2 % ophthalmic solution 1 drop 3 (three) times daily.  Marland Kitchen  furosemide (LASIX) 40 MG tablet Take 40 mg by mouth daily.   Marland Kitchen ipratropium (ATROVENT) 0.06 % nasal spray Place 2 sprays into both nostrils 2 (two) times daily.  Marland Kitchen KLOR-CON M20 20 MEQ tablet TAKE 1 TABLET BY MOUTH EVERY DAY WITH FOOD  . LUMIGAN 0.01 % SOLN Apply 1 drop to eye daily.  . Multiple Vitamin (MULTIVITAMIN WITH MINERALS) TABS tablet Take 1 tablet by mouth daily.  . Multiple Vitamins-Minerals  (PRESERVISION AREDS 2 PO) Take 1 tablet by mouth daily.  Marland Kitchen nystatin (MYCOSTATIN) 100000 UNIT/ML suspension Take 5 mLs by mouth 4 (four) times daily.  Marland Kitchen nystatin (NYSTATIN) powder Apply topically 4 (four) times daily.  . Omega-3 Fatty Acids (FISH OIL) 1000 MG CAPS Take 1,000 mg by mouth daily.  . predniSONE (DELTASONE) 20 MG tablet 3 tabs po day one, then 2 po daily x 4 days (Patient not taking: Reported on 07/04/2018)  . sertraline (ZOLOFT) 50 MG tablet Take 50 mg by mouth daily.  Marland Kitchen telmisartan (MICARDIS) 80 MG tablet TAKE 1 TABLET BY MOUTH EVERY DAY  . Turmeric (CVS TURMERIC CURCUMIN) 500 MG CAPS Take 1 tablet by mouth daily.   No facility-administered encounter medications on file as of 09/12/2018.     PHYSICAL EXAM:   General:slightly obese female in NAD Cardiovascular: regular rate and rhythm Pulmonary: BBS CTA; resp even and unlabored Abdomen: soft, nontender, + bowel sounds GU: no suprapubic tenderness Extremities: 2+ bilateral LE  edema, no joint deformities Skin: no rashes of exposed skin Neurological: alert and interactive ; oriented to self only   Connie G [place], NP

## 2018-09-18 ENCOUNTER — Other Ambulatory Visit: Payer: Self-pay

## 2018-09-18 DIAGNOSIS — L8990 Pressure ulcer of unspecified site, unspecified stage: Secondary | ICD-10-CM

## 2018-09-27 ENCOUNTER — Ambulatory Visit (INDEPENDENT_AMBULATORY_CARE_PROVIDER_SITE_OTHER): Payer: Medicare Other

## 2018-09-27 ENCOUNTER — Ambulatory Visit: Payer: Medicare Other | Admitting: Internal Medicine

## 2018-09-27 ENCOUNTER — Encounter: Payer: Self-pay | Admitting: Internal Medicine

## 2018-09-27 VITALS — BP 144/66 | HR 51 | Temp 98.0°F | Ht <= 58 in

## 2018-09-27 VITALS — BP 144/66 | HR 51 | Temp 98.0°F

## 2018-09-27 DIAGNOSIS — I129 Hypertensive chronic kidney disease with stage 1 through stage 4 chronic kidney disease, or unspecified chronic kidney disease: Secondary | ICD-10-CM

## 2018-09-27 DIAGNOSIS — N183 Chronic kidney disease, stage 3 unspecified: Secondary | ICD-10-CM

## 2018-09-27 DIAGNOSIS — Z0000 Encounter for general adult medical examination without abnormal findings: Secondary | ICD-10-CM

## 2018-09-27 DIAGNOSIS — E1122 Type 2 diabetes mellitus with diabetic chronic kidney disease: Secondary | ICD-10-CM | POA: Diagnosis not present

## 2018-09-27 DIAGNOSIS — Z6841 Body Mass Index (BMI) 40.0 and over, adult: Secondary | ICD-10-CM

## 2018-09-27 LAB — POCT URINALYSIS DIPSTICK
Bilirubin, UA: NEGATIVE
Blood, UA: NEGATIVE
Glucose, UA: NEGATIVE
KETONES UA: NEGATIVE
LEUKOCYTES UA: NEGATIVE
NITRITE UA: NEGATIVE
PROTEIN UA: NEGATIVE
Spec Grav, UA: 1.01 (ref 1.010–1.025)
Urobilinogen, UA: 0.2 E.U./dL
pH, UA: 6 (ref 5.0–8.0)

## 2018-09-27 LAB — POCT UA - MICROALBUMIN
ALBUMIN/CREATININE RATIO, URINE, POC: 30
Creatinine, POC: 300 mg/dL
MICROALBUMIN (UR) POC: 10 mg/L

## 2018-09-27 NOTE — Progress Notes (Signed)
Subjective:   Connie Poole is a 85 y.o. female who presents for Medicare Annual (Subsequent) preventive examination.  Review of Systems:  n/a Cardiac Risk Factors include: advanced age (>1155men, 21>65 women)     Objective:     Vitals: BP (!) 144/66 (BP Location: Left Arm, Patient Position: Sitting)   Pulse (!) 51   Temp 98 F (36.7 C) (Oral)   SpO2 94%   There is no height or weight on file to calculate BMI.  Advanced Directives 09/27/2018 12/22/2017  Does Patient Have a Medical Advance Directive? Yes Yes  Type of Estate agent[place]vance Directive Healthcare Power of [place];Living will Healthcare Power of Attorney  Does patient want to make changes to medical advance directive? No - Patient declined -  Copy of Healthcare Power of Attorney in Chart? No - copy requested No - copy requested    Tobacco Social History   Tobacco Use  Smoking Status Never Smoker  Smokeless Tobacco Never Used     Counseling given: Not Answered   Clinical Intake:  Pre-visit preparation completed: Yes  Pain : No/denies pain Pain Score: 0-No pain     Diabetes: Yes CBG done?: No Did pt. bring in CBG monitor from home?: No  How often do you need to have someone help you when you read instructions, pamphlets, or other written materials from your doctor or pharmacy?: 4 - Often What is the last grade level you completed in school?: 12th grade  Interpreter Needed?: No  Information entered by :: NAllen LPN  Past Medical History:  Diagnosis Date  . Arthritis   . CHF (congestive heart failure) (HCC)   . Dementia (HCC)   . Diabetes mellitus   . Glaucoma   . Hyperlipidemia   . Hypertension   . Sarcoidosis    Past Surgical History:  Procedure Laterality Date  . ABDOMINAL HYSTERECTOMY    . TOTAL KNEE ARTHROPLASTY     Family History  Problem Relation Age of Onset  . Stroke Mother   . Heart disease Father   . Prostate cancer Brother   . Cancer Brother   . Throat cancer Brother   . Alzheimer's  disease Other   . Coronary artery disease Other   . Diabetes Other   . Hypertension Other    Social History   Socioeconomic History  . Marital status: Widowed    Spouse name: Not on file  . Number of children: Not on file  . Years of education: Not on file  . Highest education level: Not on file  Occupational History  . Not on file  Social Needs  . Financial resource strain: Not hard at all  . Food insecurity:    Worry: Never true    Inability: Never true  . Transportation needs:    Medical: No    Non-medical: No  Tobacco Use  . Smoking status: Never Smoker  . Smokeless tobacco: Never Used  Substance and Sexual Activity  . Alcohol use: No    Alcohol/week: 0.0 standard drinks  . Drug use: Not Currently  . Sexual activity: Not Currently  Lifestyle  . Physical activity:    Days per week: 7 days    Minutes per session: 20 min  . Stress: Not at all  Relationships  . Social connections:    Talks on phone: Not on file    Gets together: Not on file    Attends religious service: Not on file    Active member of club or organization: Not  on file    Attends meetings of clubs or organizations: Not on file    Relationship status: Not on file  Other Topics Concern  . Not on file  Social History Narrative   06/01/18 lives with daughter, Dois Davenport    Outpatient Encounter Medications as of 09/27/2018  Medication Sig  . ACCU-CHEK COMPACT TEST DRUM test strip   . ACCU-CHEK SOFTCLIX LANCETS lancets ONE STEP  . aspirin 81 MG tablet Take 81 mg by mouth daily.  Marland Kitchen atorvastatin (LIPITOR) 40 MG tablet 07/18/17 TAKE 1 TABLET BY MOUTH AT BEDTIME (Patient taking differently: No sig reported)  . brimonidine (ALPHAGAN P) 0.1 % SOLN Apply 1 drop to eye every 8 (eight) hours.   . Calcium Carbonate-Vitamin D (CALCIUM 600+D PO) Take 1 tablet by mouth daily.   . carvedilol (COREG) 25 MG tablet Take 25 mg by mouth 2 (two) times daily with a meal.  . Cholecalciferol (D3-50 PO) Take 50 mg by mouth  daily.  . clonazePAM (KLONOPIN) 0.5 MG tablet Take 0.5-1 tablets (0.25-0.5 mg total) by mouth at bedtime as needed.  . donepezil (ARICEPT) 5 MG tablet Take 5 mg by mouth every evening.  . dorzolamide (TRUSOPT) 2 % ophthalmic solution 1 drop 3 (three) times daily.  . furosemide (LASIX) 40 MG tablet Take 40 mg by mouth daily.   Marland Kitchen ipratropium (ATROVENT) 0.06 % nasal spray Place 2 sprays into both nostrils 2 (two) times daily.  Marland Kitchen KLOR-CON M20 20 MEQ tablet TAKE 1 TABLET BY MOUTH EVERY DAY WITH FOOD  . LUMIGAN 0.01 % SOLN Apply 1 drop to eye daily.  . Melatonin 10 MG TBCR Take 1 tablet by mouth at bedtime.  . Multiple Vitamin (MULTIVITAMIN WITH MINERALS) TABS tablet Take 1 tablet by mouth daily.  . Multiple Vitamins-Minerals (PRESERVISION AREDS 2 PO) Take 1 tablet by mouth daily.  Marland Kitchen nystatin (MYCOSTATIN) 100000 UNIT/ML suspension Take 5 mLs by mouth 4 (four) times daily.  . Omega-3 Fatty Acids (FISH OIL) 1000 MG CAPS Take 1,000 mg by mouth daily.  . sertraline (ZOLOFT) 50 MG tablet Take 50 mg by mouth daily.  Marland Kitchen telmisartan (MICARDIS) 80 MG tablet TAKE 1 TABLET BY MOUTH EVERY DAY  . Turmeric ([employer] TURMERIC CURCUMIN) 500 MG CAPS Take 1 tablet by mouth daily.  . cetirizine (ZYRTEC) 10 MG tablet Take 10 mg by mouth daily.  Marland Kitchen nystatin (NYSTATIN) powder Apply topically 4 (four) times daily.  . predniSONE (DELTASONE) 20 MG tablet 3 tabs po day one, then 2 po daily x 4 days (Patient not taking: Reported on 07/04/2018)   No facility-administered encounter medications on file as of 09/27/2018.     Activities of Daily Living In your present state of health, do you have any difficulty performing the following activities: 09/27/2018  Hearing? Y  Comment some hearing loss  Vision? Y  Comment blurry vision, legally blind in right eye  Difficulty concentrating or making decisions? Y  Comment Dx of dementia  Walking or climbing stairs? Y  Comment not very mobile  Dressing or bathing? Y  Comment has  assistance   Doing errands, shopping? Y  Comment someone takes appointments and errands  Preparing Food and eating ? Y  Using the Toilet? Y  In the past six months, have you accidently leaked urine? N  Do you have problems with loss of bowel control? N  Managing your Medications? Y  Managing your Finances? Y  Housekeeping or managing your Housekeeping? Y  Some recent data might be hidden  Patient Care Team: Dorothyann PengSanders, Robyn, MD as PCP - General (Internal Medicine) Chalmers GuestWhitaker, Roy, MD as Consulting Physician (Ophthalmology)    Assessment:   This is a routine wellness examination for LeachHelen.  Exercise Activities and Dietary recommendations Current Exercise Habits: Home exercise routine, Type of exercise: Other - see comments(has PT), Time (Minutes): 30, Frequency (Times/Week): 7, Weekly Exercise (Minutes/Week): 210, Intensity: Mild, Exercise limited by: orthopedic condition(s)  Goals    . Patient Stated     none       Fall Risk Fall Risk  09/27/2018 02/24/2016  Falls in the past year? 1 Yes  Number falls in past yr: 1 2 or more  Comment slid out of chair or bed -  Injury with Fall? 0 No  Risk Factor Category  - High Fall Risk  Risk for fall due to : Impaired mobility;History of fall(s);Impaired balance/gait;Medication side effect Impaired balance/gait  Follow up Education provided;Falls prevention discussed Falls prevention discussed   Is the patient's home free of loose throw rugs in walkways, pet beds, electrical cords, etc?   yes      Grab bars in the bathroom? yes      Handrails on the stairs?   n/a      Adequate lighting?   yes  Timed Get Up and Go performed: n/a  Depression Screen PHQ 2/9 Scores 09/27/2018  Exception Documentation Other- indicate reason in comment box  Not completed (No Data)     Cognitive Function MMSE - Mini Mental State Exam 01/19/2018 07/20/2017 01/05/2017 05/26/2016 11/18/2015  Orientation to time 5 5 5 4 4   Orientation to Place 4 4 3 5 3     Registration 3 3 3 3 2   Attention/ Calculation 5 5 5 5 5   Recall 1 1 3 2 3   Language- name 2 objects 2 2 2 1 2   Language- repeat 1 1 0 1 0  Language- follow 3 step command 1 1 2 2 3   Language- read & follow direction 1 1 1 1 1   Write a sentence 1 1 1 1 1   Copy design 0 0 0 0 0  Total score 24 24 25 25 24         Immunization History  Administered Date(s) Administered  . Influenza, High Dose Seasonal PF 07/05/2018    Qualifies for Shingles Vaccine? yes  Screening Tests Health Maintenance  Topic Date Due  . TETANUS/TDAP  05/04/2022  . INFLUENZA VACCINE  Completed  . DEXA SCAN  Completed  . PNA vac Low Risk Adult  Completed    Cancer Screenings: Lung: Low Dose CT Chest recommended if Age 87-80 years, 30 pack-year currently smoking OR have quit w/in 15years. Patient does not qualify. Breast:  Up to date on Mammogram? Yes   Up to date of Bone Density/Dexa? Yes Colorectal: not required  Additional Screenings: : Hepatitis C Screening: n/a     Plan:   Dx of dementia. 6CIT not done. Unable to stand so weight and height were not obtained. I have personally reviewed and noted the following in the patient's chart:   . Medical and social history . Use of alcohol, tobacco or illicit drugs  . Current medications and supplements . Functional ability and status . Nutritional status . Physical activity . Advanced directives . List of other physicians . Hospitalizations, surgeries, and ER visits in previous 12 months . Vitals . Screenings to include cognitive, depression, and falls . Referrals and appointments  In addition, I have reviewed and discussed with patient  certain preventive protocols, quality metrics, and best practice recommendations. A written personalized care plan for preventive services as well as general preventive health recommendations were provided to patient.     Barb Merino, LPN  1/61/[id number]

## 2018-09-27 NOTE — Patient Instructions (Signed)
Connie Poole , Thank you for taking time to come for your Medicare Wellness Visit. I appreciate your ongoing commitment to your health goals. Please review the following plan we discussed and let me know if I can assist you in the future.   Screening recommendations/referrals: Colonoscopy: not required Mammogram: not required Bone Density: 03/13/2014 Recommended yearly ophthalmology/optometry visit for glaucoma screening and checkup Recommended yearly dental visit for hygiene and checkup  Vaccinations: Influenza vaccine: 04/2018 Pneumococcal vaccine: 05/2016 Tdap vaccine: 04/2012 Shingles vaccine: discussed    Advanced directives: Please bring a copy of your POA (Power of [employer]) and/or Living Will to your next appointment.    Conditions/risks identified: unable to stand  Next appointment: 01/24/2019 at 3:15   Preventive Care 65 Years and Older, Female Preventive care refers to lifestyle choices and visits with your health care provider that can promote health and wellness. What does preventive care include?  A yearly physical exam. This is also called an annual well check.  Dental exams once or twice a year.  Routine eye exams. Ask your health care provider how often you should have your eyes checked.  Personal lifestyle choices, including:  Daily care of your teeth and gums.  Regular physical activity.  Eating a healthy diet.  Avoiding tobacco and drug use.  Limiting alcohol use.  Practicing safe sex.  Taking low-dose aspirin every day.  Taking vitamin and mineral supplements as recommended by your health care provider. What happens during an annual well check? The services and screenings done by your health care provider during your annual well check will depend on your age, overall health, lifestyle risk factors, and family history of disease. Counseling  Your health care provider may ask you questions about your:  Alcohol use.  Tobacco use.  Drug  use.  Emotional well-being.  Home and relationship well-being.  Sexual activity.  Eating habits.  History of falls.  Memory and ability to understand (cognition).  Work and work Astronomer.  Reproductive health. Screening  You may have the following tests or measurements:  Height, weight, and BMI.  Blood pressure.  Lipid and cholesterol levels. These may be checked every 5 years, or more frequently if you are over 24 years old.  Skin check.  Lung cancer screening. You may have this screening every year starting at age 45 if you have a 30-pack-year history of smoking and currently smoke or have quit within the past 15 years.  Fecal occult blood test (FOBT) of the stool. You may have this test every year starting at age 67.  Flexible sigmoidoscopy or colonoscopy. You may have a sigmoidoscopy every 5 years or a colonoscopy every 10 years starting at age 41.  Hepatitis C blood test.  Hepatitis B blood test.  Sexually transmitted disease (STD) testing.  Diabetes screening. This is done by checking your blood sugar (glucose) after you have not eaten for a while (fasting). You may have this done every 1-3 years.  Bone density scan. This is done to screen for osteoporosis. You may have this done starting at age 30.  Mammogram. This may be done every 1-2 years. Talk to your health care provider about how often you should have regular mammograms. Talk with your health care provider about your test results, treatment options, and if necessary, the need for more tests. Vaccines  Your health care provider may recommend certain vaccines, such as:  Influenza vaccine. This is recommended every year.  Tetanus, diphtheria, and acellular pertussis (Tdap, Td) vaccine. You may  need a Td booster every 10 years.  Zoster vaccine. You may need this after age 86.  Pneumococcal 13-valent conjugate (PCV13) vaccine. One dose is recommended after age 5.  Pneumococcal polysaccharide  (PPSV23) vaccine. One dose is recommended after age 32. Talk to your health care provider about which screenings and vaccines you need and how often you need them. This information is not intended to replace advice given to you by your health care provider. Make sure you discuss any questions you have with your health care provider. Document Released: 08/29/2015 Document Revised: 04/21/2016 Document Reviewed: 06/03/2015 Elsevier Interactive Patient Education  2017 [place] Prevention in the Home Falls can cause injuries. They can happen to people of all ages. There are many things you can do to make your home safe and to help prevent falls. What can I do on the outside of my home?  Regularly fix the edges of walkways and driveways and fix any cracks.  Remove anything that might make you trip as you walk through a door, such as a raised step or threshold.  Trim any bushes or trees on the path to your home.  Use bright outdoor lighting.  Clear any walking paths of anything that might make someone trip, such as rocks or tools.  Regularly check to see if handrails are loose or broken. Make sure that both sides of any steps have handrails.  Any raised decks and porches should have guardrails on the edges.  Have any leaves, snow, or ice cleared regularly.  Use sand or salt on walking paths during winter.  Clean up any spills in your garage right away. This includes oil or grease spills. What can I do in the bathroom?  Use night lights.  Install grab bars by the toilet and in the tub and shower. Do not use towel bars as grab bars.  Use non-skid mats or decals in the tub or shower.  If you need to sit down in the shower, use a plastic, non-slip stool.  Keep the floor dry. Clean up any water that spills on the floor as soon as it happens.  Remove soap buildup in the tub or shower regularly.  Attach bath mats securely with double-sided non-slip rug tape.  Do not have  throw rugs and other things on the floor that can make you trip. What can I do in the bedroom?  Use night lights.  Make sure that you have a light by your bed that is easy to reach.  Do not use any sheets or blankets that are too big for your bed. They should not hang down onto the floor.  Have a firm chair that has side arms. You can use this for support while you get dressed.  Do not have throw rugs and other things on the floor that can make you trip. What can I do in the kitchen?  Clean up any spills right away.  Avoid walking on wet floors.  Keep items that you use a lot in easy-to-reach places.  If you need to reach something above you, use a strong step stool that has a grab bar.  Keep electrical cords out of the way.  Do not use floor polish or wax that makes floors slippery. If you must use wax, use non-skid floor wax.  Do not have throw rugs and other things on the floor that can make you trip. What can I do with my stairs?  Do not leave any items on  the stairs.  Make sure that there are handrails on both sides of the stairs and use them. Fix handrails that are broken or loose. Make sure that handrails are as long as the stairways.  Check any carpeting to make sure that it is firmly attached to the stairs. Fix any carpet that is loose or worn.  Avoid having throw rugs at the top or bottom of the stairs. If you do have throw rugs, attach them to the floor with carpet tape.  Make sure that you have a light switch at the top of the stairs and the bottom of the stairs. If you do not have them, ask someone to add them for you. What else can I do to help prevent falls?  Wear shoes that:  Do not have high heels.  Have rubber bottoms.  Are comfortable and fit you well.  Are closed at the toe. Do not wear sandals.  If you use a stepladder:  Make sure that it is fully opened. Do not climb a closed stepladder.  Make sure that both sides of the stepladder are  locked into place.  Ask someone to hold it for you, if possible.  Clearly mark and make sure that you can see:  Any grab bars or handrails.  First and last steps.  Where the edge of each step is.  Use tools that help you move around (mobility aids) if they are needed. These include:  Canes.  Walkers.  Scooters.  Crutches.  Turn on the lights when you go into a dark area. Replace any light bulbs as soon as they burn out.  Set up your furniture so you have a clear path. Avoid moving your furniture around.  If any of your floors are uneven, fix them.  If there are any pets around you, be aware of where they are.  Review your medicines with your doctor. Some medicines can make you feel dizzy. This can increase your chance of falling. Ask your doctor what other things that you can do to help prevent falls. This information is not intended to replace advice given to you by your health care provider. Make sure you discuss any questions you have with your health care provider. Document Released: 05/29/2009 Document Revised: 01/08/2016 Document Reviewed: 09/06/2014 Elsevier Interactive Patient Education  2017 [employer].

## 2018-09-28 LAB — CMP14+EGFR
A/G RATIO: 1.7 (ref 1.2–2.2)
ALT: 10 IU/L (ref 0–32)
AST: 9 IU/L (ref 0–40)
Albumin: 4 g/dL (ref 3.5–4.6)
Alkaline Phosphatase: 66 IU/L (ref 39–117)
BILIRUBIN TOTAL: 0.3 mg/dL (ref 0.0–1.2)
BUN/Creatinine Ratio: 27 (ref 12–28)
BUN: 38 mg/dL — ABNORMAL HIGH (ref 10–36)
CALCIUM: 9.4 mg/dL (ref 8.7–10.3)
CHLORIDE: 100 mmol/L (ref 96–106)
CO2: 25 mmol/L (ref 20–29)
Creatinine, Ser: 1.42 mg/dL — ABNORMAL HIGH (ref 0.57–1.00)
GFR, EST AFRICAN AMERICAN: 36 mL/min/{1.73_m2} — AB (ref >59)
GFR, EST NON AFRICAN AMERICAN: 31 mL/min/{1.73_m2} — AB (ref >59)
Globulin, Total: 2.4 g/dL (ref 1.5–4.5)
Glucose: 137 mg/dL — ABNORMAL HIGH (ref 65–99)
POTASSIUM: 4.5 mmol/L (ref 3.5–5.2)
SODIUM: 141 mmol/L (ref 134–144)
TOTAL PROTEIN: 6.4 g/dL (ref 6.0–8.5)

## 2018-09-28 LAB — CBC
HEMATOCRIT: 29.3 % — AB (ref 34.0–46.6)
Hemoglobin: 9 g/dL — ABNORMAL LOW (ref 11.1–15.9)
MCH: 26.9 pg (ref 26.6–33.0)
MCHC: 30.7 g/dL — AB (ref 31.5–35.7)
MCV: 88 fL (ref 79–97)
PLATELETS: 309 10*3/uL (ref 150–450)
RBC: 3.35 x10E6/uL — ABNORMAL LOW (ref 3.77–5.28)
RDW: 12.8 % (ref 11.7–15.4)
WBC: 4.7 10*3/uL (ref 3.4–10.8)

## 2018-09-28 LAB — LIPID PANEL
CHOL/HDL RATIO: 4.3 ratio (ref 0.0–4.4)
Cholesterol, Total: 155 mg/dL (ref 100–199)
HDL: 36 mg/dL — AB (ref >39)
LDL CALC: 99 mg/dL (ref 0–99)
TRIGLYCERIDES: 100 mg/dL (ref 0–149)
VLDL Cholesterol Cal: 20 mg/dL (ref 5–40)

## 2018-09-28 LAB — HEMOGLOBIN A1C
Est. average glucose Bld gHb Est-mCnc: 117 mg/dL
Hgb A1c MFr Bld: 5.7 % — ABNORMAL HIGH (ref 4.8–5.6)

## 2018-09-29 ENCOUNTER — Other Ambulatory Visit: Payer: Self-pay | Admitting: Internal Medicine

## 2018-10-01 NOTE — Progress Notes (Signed)
Subjective:     Patient ID: Connie Poole , female    DOB: 08-Aug-1921 , 85 y.o.   MRN: [id number]   Chief Complaint  Patient presents with  . Hypertension  . Diabetes    HPI  Hypertension  This is a chronic problem. The current episode started more than 1 year ago. The problem has been gradually improving since onset. The problem is uncontrolled. Pertinent negatives include no blurred vision, palpitations or shortness of breath. Hypertensive end-organ damage includes kidney disease.  Diabetes  She presents for her follow-up diabetic visit. She has type 2 diabetes mellitus. Her disease course has been stable. There are no hypoglycemic associated symptoms. Pertinent negatives for diabetes include no blurred vision. There are no hypoglycemic complications. Diabetic complications include nephropathy. Risk factors for coronary artery disease include diabetes mellitus, dyslipidemia, hypertension, obesity and post-menopausal. Her breakfast blood glucose is taken between 8-9 am. Her breakfast blood glucose range is generally 110-130 mg/dl. An ACE inhibitor/angiotensin II receptor blocker is being taken.     Past Medical History:  Diagnosis Date  . Arthritis   . CHF (congestive heart failure) ([place])   . Dementia ([place])   . Diabetes mellitus   . Glaucoma   . Hyperlipidemia   . Hypertension   . Sarcoidosis      Family History  Problem Relation Age of Onset  . Stroke Mother   . Heart disease Father   . Prostate cancer Brother   . Cancer Brother   . Throat cancer Brother   . Alzheimer's disease Other   . Coronary artery disease Other   . Diabetes Other   . Hypertension Other      Current Outpatient Medications:  .  ACCU-CHEK COMPACT TEST DRUM test strip, , Disp: , Rfl:  .  ACCU-CHEK SOFTCLIX LANCETS lancets, ONE STEP, Disp: , Rfl:  .  aspirin 81 MG tablet, Take 81 mg by mouth daily., Disp: , Rfl:  .  atorvastatin (LIPITOR) 40 MG tablet, 07/18/17 TAKE 1 TABLET BY MOUTH AT BEDTIME  (Patient taking differently: No sig reported), Disp: 90 tablet, Rfl: 1 .  brimonidine (ALPHAGAN P) 0.1 % SOLN, Apply 1 drop to eye every 8 (eight) hours. , Disp: , Rfl:  .  Calcium Carbonate-Vitamin D (CALCIUM 600+D PO), Take 1 tablet by mouth daily. , Disp: , Rfl:  .  carvedilol (COREG) 25 MG tablet, Take 25 mg by mouth 2 (two) times daily with a meal., Disp: , Rfl:  .  cetirizine (ZYRTEC) 10 MG tablet, Take 10 mg by mouth daily., Disp: , Rfl:  .  Cholecalciferol (D3-50 PO), Take 50 mg by mouth daily., Disp: , Rfl:  .  clonazePAM (KLONOPIN) 0.5 MG tablet, Take 0.5-1 tablets (0.25-0.5 mg total) by mouth at bedtime as needed., Disp: 30 tablet, Rfl: 5 .  donepezil (ARICEPT) 5 MG tablet, Take 5 mg by mouth every evening., Disp: , Rfl: 2 .  dorzolamide (TRUSOPT) 2 % ophthalmic solution, 1 drop 3 (three) times daily., Disp: , Rfl:  .  furosemide (LASIX) 40 MG tablet, Take 40 mg by mouth daily. , Disp: , Rfl:  .  ipratropium (ATROVENT) 0.06 % nasal spray, Place 2 sprays into both nostrils 2 (two) times daily., Disp: , Rfl:  .  KLOR-CON M20 20 MEQ tablet, TAKE 1 TABLET BY MOUTH EVERY DAY WITH FOOD, Disp: 90 tablet, Rfl: 1 .  LUMIGAN 0.01 % SOLN, Apply 1 drop to eye daily., Disp: , Rfl: 6 .  Melatonin 10 MG TBCR,  Take 1 tablet by mouth at bedtime., Disp: , Rfl:  .  Multiple Vitamin (MULTIVITAMIN WITH MINERALS) TABS tablet, Take 1 tablet by mouth daily., Disp: , Rfl:  .  Multiple Vitamins-Minerals (PRESERVISION AREDS 2 PO), Take 1 tablet by mouth daily., Disp: , Rfl:  .  nystatin (MYCOSTATIN) 100000 UNIT/ML suspension, Take 5 mLs by mouth 4 (four) times daily., Disp: , Rfl:  .  nystatin (NYSTATIN) powder, Apply topically 4 (four) times daily., Disp: , Rfl:  .  Omega-3 Fatty Acids (FISH OIL) 1000 MG CAPS, Take 1,000 mg by mouth daily., Disp: , Rfl:  .  sertraline (ZOLOFT) 50 MG tablet, Take 50 mg by mouth daily., Disp: , Rfl:  .  telmisartan (MICARDIS) 80 MG tablet, TAKE 1 TABLET BY MOUTH EVERY DAY, Disp:  90 tablet, Rfl: 1 .  Turmeric ([employer]S TURMERIC CURCUMIN) 500 MG CAPS, Take 1 tablet by mouth daily., Disp: , Rfl:    Allergies  Allergen Reactions  . Celebrex [Celecoxib] Nausea And Vomiting  . Vioxx [Rofecoxib] Nausea And Vomiting     Review of Systems  Constitutional: Negative.   Eyes: Negative for blurred vision.  Respiratory: Negative.  Negative for shortness of breath.   Cardiovascular: Negative.  Negative for palpitations.  Gastrointestinal: Negative.   Neurological: Negative.   Psychiatric/Behavioral: Negative.      Today's Vitals   09/27/18 1516  BP: (!) 144/66  Pulse: (!) 51  Temp: 98 F (36.7 C)  TempSrc: Oral  SpO2: 94%  Height: _0  (1.473 m)   Body mass index is 40.55 kg/m.   Objective:  Physical Exam Vitals signs and nursing note reviewed.  Constitutional:      Appearance: Normal appearance. She is obese.  HENT:     Head: Normocephalic and atraumatic.  Cardiovascular:     Rate and Rhythm: Normal rate and regular rhythm.     Heart sounds: Normal heart sounds.     Comments: She has LE edema Pulmonary:     Effort: Pulmonary effort is normal.     Breath sounds: Normal breath sounds.  Neurological:     General: No focal deficit present.     Mental Status: She is alert.  Psychiatric:        Mood and Affect: Mood normal.         Assessment And Plan:     1. Hypertensive nephropathy  Fair control. She will continue with current meds. She is encouraged to avoid adding salt to her foods.   2. Type 2 diabetes mellitus with stage 3 chronic kidney disease, without long-term current use of insulin ([place])  I will check labs as listed below. Importance of dietary compliance was discussed with the patient.   - Lipid panel - CMP14+EGFR - Hemoglobin A1c - CBC no Diff - POCT Urinalysis Dipstick (81002) - POCT UA - Microalbumin  3. Class 3 severe obesity due to excess calories with serious comorbidity and body mass index (BMI) of 45.0 to 49.9 in adult  [facility])  She is encouraged to limit her intake of sugary beverages and processed foods. She is also encouraged to perform chair exercises five days weekly. She is currently in physical therapy. She is encouraged to perform exercises even when they are not present.   Maximino Greenland, MD

## 2018-10-03 ENCOUNTER — Other Ambulatory Visit: Payer: Medicare Other | Admitting: Internal Medicine

## 2018-10-03 ENCOUNTER — Telehealth: Payer: Self-pay | Admitting: Internal Medicine

## 2018-10-03 NOTE — Telephone Encounter (Signed)
1pm: Arrived to home. At the same time daughter Hassie Bruce [phone]) called to reschedule, as she was out and about with caring for another family member. Apt rescheduled for 10/17/18 at 1pm.   Holly Bodily NP [phone]

## 2018-10-12 ENCOUNTER — Other Ambulatory Visit: Payer: Self-pay | Admitting: Internal Medicine

## 2018-10-15 ENCOUNTER — Other Ambulatory Visit: Payer: Self-pay | Admitting: Internal Medicine

## 2018-10-17 ENCOUNTER — Other Ambulatory Visit: Payer: Medicare Other | Admitting: Internal Medicine

## 2018-10-17 ENCOUNTER — Encounter: Payer: Self-pay | Admitting: Internal Medicine

## 2018-10-17 DIAGNOSIS — Z515 Encounter for palliative care: Secondary | ICD-10-CM

## 2018-10-17 NOTE — Progress Notes (Signed)
March 3rd, 2020 [facility] Telephone: [phone] Fax: [phone]  PATIENT NAME: Connie Poole DOB: 01/14/1921 MRN: [id number]  PRIMARY CARE PROVIDER:   Dorothyann Peng, MD  REFERRING PROVIDER:  Dorothyann Peng, MD [address] [place], [place] [address]  RESPONSIBLE PARTY:   Rosette Reveal (daughter) [phone]  HBG A1C 5.7. Home CBGs checks about 2 x/month range 83-96  now on Coricidin 1 bid  1. Continues mucus production with cough, worse in the mornings. Cough of thick clear secretions. Lung fields are clear. Daughter had been administering guaifenesin bid. Patient drinking adequate amounts of fluid.             -I think the guaifenesin is contributing to the excess secretions. Recommended decrease frequency to qd prn.   2. Dementia with behavioral disturbances (hallucinations and hollering out at night): Daughter reports patient has been stable on current regimen of Klonopin 0.5mg  bid, and Zoloft  qd. Patient is pleasantly conversant. A and O to self and place. She is conitinent of bowel and bladder, and is able to feed herself. She is a heavy one person transfer. Patient naps and dozes off in her wheelchair during the day, and may be up for hours at night hollering out. This is disruptive to daughter's sleep. We discusses some various options (sparing use of evening Seroquel, closing the door) but didn't come up with a satisfactory answer. Daughter leaves the TV on in patient's room at night ([employer] station) for company. Her daughter knows that there is the option of tapping into use of low dose Seroquel but is hesitant d/t side effects in the elderly. Daughter is PCG; she also takes care of her elderly M-I-L in the home. Family private pays for a sitter. She gets some assistance from a grant. I provided her a link to another source of funding, [employer] (Caregiver [employer]atives to Running on [employer]) Acquanetta Belling [phone] or 800 [phone]) for assist respite care for families of patients with dementia   3. Sedentary due to deconditioning and arthritic joint pain (knees). PT/OT follows, as insurance allows. Adequate pain management on Tylenol  2 tabs/day, and tumeric. There is also 325 mg of tylenol in patient's Coricidin (bid). Her function is stable, as a one person assist to stand; can pivot and walk with walker with a slow shuffling gait. Patient has developed a 2mm stage 2 pressure injury on her coccyx. Daughter and I discussed helping patient alternate a pillow from one buttock to another every few hours or so, to help keep pressure off of that area. Patient does get up to go to the bathroom (with assist), so she gets some position changes.  4. CHF: Compensated by exam; lung fields clear. LE edema which daughter states is improved in the mornings. Her BP is elevated today but over the last month ranges 116/64-136/62. HR marginally low in the 50's, but again is stable. On Coreg and Lasix.  5. Follow up: daughter requests f/u NP visit q 6 weeks. Next scheduled April 14th at 1pm.  6. Advanced Care Directives: DNR  I spent 60 minutes providing this consultation, from 1pm to 2pm. More than 50% of the time in this consultation was spent coordinating communication, interview of daughter and patient, reconciling medication list, and charting.   HPI/INTERVAL NOTE: Sweet 85yo AA female with h/o Lewy Body dementia with behavioral disturbances, HTN, DM, and CHF. Routine Palliative Care follow up from 09/12/18 to assist with symptom  management.  CODE STATUS: DNR-comfort; no antibiotic's, no IVF's no feeding tube  PPS: 30%  HOSPICE ELIGIBILITY/DIAGNOSIS: not at this time, as prognosis thought greater than 6 months.  PAST MEDICAL HISTORY:  Past Medical History:  Diagnosis Date  . Arthritis   . CHF (congestive heart failure) ([facility])   . Dementia ([facility])   . Diabetes mellitus   . Glaucoma   .  Hyperlipidemia   . Hypertension   . Sarcoidosis     SOCIAL HX:  Social History   Tobacco Use  . Smoking status: Never Smoker  . Smokeless tobacco: Never Used  Substance Use Topics  . Alcohol use: No    Alcohol/week: 0.0 standard drinks    ALLERGIES:  Allergies  Allergen Reactions  . Celebrex [Celecoxib] Nausea And Vomiting  . Vioxx [Rofecoxib] Nausea And Vomiting     PERTINENT MEDICATIONS:  Outpatient Encounter Medications as of 10/17/2018  Medication Sig  . ACCU-CHEK COMPACT TEST DRUM test strip   . ACCU-CHEK SOFTCLIX LANCETS lancets ONE STEP  . aspirin 81 MG tablet Take 81 mg by mouth daily.  Marland Kitchen atorvastatin (LIPITOR) 40 MG tablet 07/18/17 TAKE 1 TABLET BY MOUTH AT BEDTIME (Patient taking differently: No sig reported)  . brimonidine (ALPHAGAN P) 0.1 % SOLN Apply 1 drop to eye every 8 (eight) hours.   . Calcium Carbonate-Vitamin D (CALCIUM 600+D PO) Take 1 tablet by mouth daily.   . carvedilol (COREG) 25 MG tablet Take 25 mg by mouth 2 (two) times daily with a meal.  . Chlorphen-Pseudoephed-APAP (CORICIDIN D PO) Take 1 tablet by mouth 2 (two) times daily. cough  . Cholecalciferol (D3-50 PO) Take 50 mg by mouth daily.  . clonazePAM (KLONOPIN) 0.5 MG tablet Take 0.5-1 tablets (0.25-0.5 mg total) by mouth at bedtime as needed.  . donepezil (ARICEPT) 5 MG tablet TAKE 1 TABLET BY MOUTH EVERY DAY IN THE EVENING  . dorzolamide (TRUSOPT) 2 % ophthalmic solution Place 1 drop into both eyes 2 (two) times daily.   . furosemide (LASIX) 40 MG tablet TAKE 1 TABLET BY MOUTH EVERY DAY  . ipratropium (ATROVENT) 0.06 % nasal spray Place 2 sprays into both nostrils every 8 (eight) hours as needed.   Marland Kitchen KLOR-CON M20 20 MEQ tablet TAKE 1 TABLET BY MOUTH EVERY DAY WITH FOOD  . LUMIGAN 0.01 % SOLN Place 1 drop into the left eye at bedtime.   . Melatonin 10 MG TBCR Take 1 tablet by mouth at bedtime.  . Multiple Vitamin (MULTIVITAMIN WITH MINERALS) TABS tablet Take 1 tablet by mouth daily.  . Multiple  Vitamins-Minerals (PRESERVISION AREDS 2 PO) Take 1 tablet by mouth daily.  . Omega-3 Fatty Acids (FISH OIL) 1000 MG CAPS Take 1,000 mg by mouth daily.  . sertraline (ZOLOFT) 50 MG tablet Take 50 mg by mouth daily.  Marland Kitchen telmisartan (MICARDIS) 80 MG tablet TAKE 1 TABLET BY MOUTH EVERY DAY  . Turmeric (CVS TURMERIC CURCUMIN) 500 MG CAPS Take 1 tablet by mouth daily.  . [DISCONTINUED] cetirizine (ZYRTEC) 10 MG tablet Take 10 mg by mouth daily.  . [DISCONTINUED] nystatin (MYCOSTATIN) 100000 UNIT/ML suspension Take 5 mLs by mouth 4 (four) times daily.  . [DISCONTINUED] nystatin (NYSTATIN) powder Apply topically 4 (four) times daily.   No facility-administered encounter medications on file as of 10/17/2018.     PHYSICAL EXAM:  BP 150/70, HR 56, RR 16 General: Well nourished, cooperative, pleasantly conversant Cardiovascular: regular rate and rhythm, systolic murmur. Bracycardic Pulmonary: bibasilar inspiratory crackles that clear with deep breaths.  Distant harsh sounds poorly localized thought referred from upper airway. clear ant fields Abdomen: soft, nontender, + bowel sounds GU: no suprapubic tenderness Extremities: bilateral LE edema to mid calves, non-weeping. Skin: no rashes. 21mm stage 2 coccyx pressure injury Neurological: Weakness but otherwise nonfocal  Anselm Lis, NP

## 2018-10-30 DIAGNOSIS — F0390 Unspecified dementia without behavioral disturbance: Secondary | ICD-10-CM | POA: Diagnosis not present

## 2018-10-30 DIAGNOSIS — R2689 Other abnormalities of gait and mobility: Secondary | ICD-10-CM

## 2018-10-30 DIAGNOSIS — I129 Hypertensive chronic kidney disease with stage 1 through stage 4 chronic kidney disease, or unspecified chronic kidney disease: Secondary | ICD-10-CM | POA: Diagnosis not present

## 2018-10-30 DIAGNOSIS — E1122 Type 2 diabetes mellitus with diabetic chronic kidney disease: Secondary | ICD-10-CM

## 2018-10-30 DIAGNOSIS — N183 Chronic kidney disease, stage 3 (moderate): Secondary | ICD-10-CM

## 2018-10-30 DIAGNOSIS — M6281 Muscle weakness (generalized): Secondary | ICD-10-CM

## 2018-10-30 DIAGNOSIS — Z6841 Body Mass Index (BMI) 40.0 and over, adult: Secondary | ICD-10-CM

## 2018-10-30 DIAGNOSIS — Z9181 History of falling: Secondary | ICD-10-CM

## 2018-10-30 DIAGNOSIS — H548 Legal blindness, as defined in USA: Secondary | ICD-10-CM | POA: Diagnosis not present

## 2018-10-30 DIAGNOSIS — F330 Major depressive disorder, recurrent, mild: Secondary | ICD-10-CM

## 2018-11-07 ENCOUNTER — Ambulatory Visit: Payer: Medicare Other | Admitting: Internal Medicine

## 2018-11-08 ENCOUNTER — Other Ambulatory Visit (INDEPENDENT_AMBULATORY_CARE_PROVIDER_SITE_OTHER): Payer: Medicare Other

## 2018-11-08 DIAGNOSIS — R410 Disorientation, unspecified: Secondary | ICD-10-CM | POA: Diagnosis not present

## 2018-11-08 LAB — POCT URINALYSIS DIPSTICK
Bilirubin, UA: NEGATIVE
Blood, UA: NEGATIVE
Glucose, UA: NEGATIVE
Ketones, UA: NEGATIVE
NITRITE UA: NEGATIVE
PROTEIN UA: NEGATIVE
SPEC GRAV UA: 1.015 (ref 1.010–1.025)
Urobilinogen, UA: 0.2 E.U./dL
pH, UA: 6.5 (ref 5.0–8.0)

## 2018-11-09 ENCOUNTER — Other Ambulatory Visit: Payer: Self-pay | Admitting: Internal Medicine

## 2018-11-09 ENCOUNTER — Other Ambulatory Visit: Payer: Self-pay

## 2018-11-09 DIAGNOSIS — R410 Disorientation, unspecified: Secondary | ICD-10-CM

## 2018-11-10 ENCOUNTER — Other Ambulatory Visit: Payer: Self-pay

## 2018-11-10 MED ORDER — CARVEDILOL 25 MG PO TABS: 25.0000 mg | ORAL_TABLET | Freq: Two times a day (BID) | ORAL | 1 refills | Status: DC

## 2018-11-10 MED ORDER — SERTRALINE HCL 50 MG PO TABS: 50.0000 mg | ORAL_TABLET | Freq: Every day | ORAL | 1 refills | Status: DC

## 2018-11-11 LAB — URINE CULTURE

## 2018-11-15 ENCOUNTER — Other Ambulatory Visit: Payer: Self-pay

## 2018-11-15 MED ORDER — AMOXICILLIN 500 MG PO TABS: 500.0000 mg | ORAL_TABLET | Freq: Two times a day (BID) | ORAL | 0 refills | Status: DC

## 2018-11-20 ENCOUNTER — Telehealth: Payer: Self-pay

## 2018-11-20 NOTE — Telephone Encounter (Signed)
Mrs. Lahoma Rocker the pt's daughter called and left a message that she got the message about the pt's lab results and that she started the pt on the medication for the UTI last Thursday.

## 2018-11-21 ENCOUNTER — Telehealth: Payer: Self-pay | Admitting: Neurology

## 2018-11-21 ENCOUNTER — Telehealth: Payer: Self-pay

## 2018-11-21 NOTE — Telephone Encounter (Signed)
Called the patient to inform them that our office has placed new protocols in place for our office visits. Due to the virus pandemic our office is reducing our number of office visits in order to minimize the risk to our patients and healthcare providers. Spoke with the patient's daughter and she states the patient is stable at this time. I pushed the patient out until august

## 2018-11-21 NOTE — Telephone Encounter (Signed)
I returned the pt's daughter Mrs. Wallington's call and left on the voicemail that the amoxicillin dose was prescribed because that's the dose that is safe for the pt's kidney function and if she wants to drop off a urine specimen to be sent to the lab to see what med would be best to treat the UTI Dr. Allyne Gee said that would be okay.

## 2018-11-28 ENCOUNTER — Other Ambulatory Visit: Payer: Self-pay

## 2018-11-28 ENCOUNTER — Encounter: Payer: Self-pay | Admitting: Internal Medicine

## 2018-11-28 ENCOUNTER — Other Ambulatory Visit: Payer: Medicare Other | Admitting: Internal Medicine

## 2018-11-28 DIAGNOSIS — Z515 Encounter for palliative care: Secondary | ICD-10-CM

## 2018-11-28 NOTE — Progress Notes (Signed)
March 14th, 2020 [facility] Telephone: [phone] Fax: [phone]) [phone]  Due to the current COVID-19 infection/crises, the patient and family prefer, and have given their verbal consent for, a provider visit via telemedicine. HIPPA policies of confidentially were discussed and daughter. Video-audio (telehealth) contact was unable to be done due technical barriers from the patients side.  PATIENT NAME:Connie Poole DOB:Sep 16, 1920 VOZ:[id number]  PRIMARY CARE PROVIDER:Sanders, Melina Schoolsobyn, MD  REFERRING PROVIDER:Sanders, Melina Schoolsobyn, MD [address] [place], [place] [id number]  RESPONSIBLE PARTY:[name] Lahoma RockerWallington (daughter) [phone]  1. Continues mucus production seems increased at night. No longer taking the guaifenesin. Daughter notes secretions are not as thick. She believes component of allergies. Dtr intends to start [employer] brand of Allergia.  -Discussed with dtr that she could also try OTC Flonase should drippy nose and post nasel gtt become bothersome.  2. Dementia with behavioral disturbances (hallucinations and hollering out at night): Exacerbation of confusion over these last few weeks thought r/t UTI, which was treated with course of Amoxicillin 500 bid (completed a 7d course 5 days ago). Slowly returning to her baseline. Patient is easily reoriented to place/family members.  Daughter reports patient continues stable on current regimen of Klonopin 0.5mg  bid, and Zoloft 50mg  qd. Patient is pleasantly conversant. A and O to self and place. She is conitinent of bowel and bladder, and is able to feed herself. She is a heavy one person transfer. Patient naps and dozes off in her wheelchair during the day, and may be up for hours at night hollering out. This is disruptive to daughter's sleep. We discusses some various options (sparing use of evening Seroquel, closing the door) but didn't come up with a satisfactory answer.  Daughter leaves the TV on in patient's room at night ([employer] station) for company. Her daughter knows that there is the option of tapping into use of low dose Seroquel but is hesitant d/t side effects in the elderly. Daughter is PCG; she also takes care of her elderly M-I-L in the home. Family private pays for a sitter. She gets some assistance from a grant. She lost track of the link I had provided to her regarding another source of funding, [employer] (Caregiver Alternatives to Running on [employer]) Acquanetta Bellingatalie Leary [phone] or [phone]) for assist respite care for families of patients with dementia. I will resend this link to her.   3. Sedentary due to deconditioning and arthritic joint pain (knees). PT/OT follows, as insurance allows (available via some grant monies). Adequate pain management on Tylenol 500mg  2 tabs/day, and tumeric. There is also 325 mg of tylenol in patient's Coricidin (bid). Her function is stable, as a one person assist to stand; can pivot and walk with walker with a slow shuffling gait.Resolution of small stage 2 pressure injury on her coccyx. Patient does get up to go to the bathroom (with assist), so she gets some position changes.  4. CHF: Appears compensated by dtr report.  5. Follow up: daughter requests f/u NP visit q 4-6 weeks. Next scheduled telehealth visit Thurs May 14th at 1pm.  6. Advanced Care Directives: DNR in place; daughter believes she has copy somewhere in the home. I'll mail her another. We discussed completing a MOST form. I will tuck a copy, with some educational material, into the post. I also sent the daughter an online link for further MOST review, should she wish ([email or web address]://www.ncmedsoc.org/wp-content/uploads/[email or web address]/06/Using-the-MOST-Form-Guidance-for-HC-Professionals1.pdf). We plan to review and complete at time of our next visit.  I spent providing this consultation, from 1pmto 2pm. More than 50% of the time in this  consultation was spent coordinating communication, interview of daughter and patient, reconciling medication list, and charting.   HPI/INTERVAL NOTE: Sweet 85yo AA female with h/o Lewy Body dementia with behavioral disturbances, HTN, DM, and CHF. Routine Palliative Care follow up from 11/28/18 to assist with symptom management.  CODE STATUS:DNR-comfort; no antibiotic's, no IVF's no feeding tube  PPS:30%  HOSPICE ELIGIBILITY/DIAGNOSIS:not at this time, as prognosis thought greater than 6 months.  PAST MEDICAL HISTORY:  Past Medical History:  Diagnosis Date   Arthritis    CHF (congestive heart failure) ([facility])    Dementia ([facility])    Diabetes mellitus    Glaucoma    Hyperlipidemia    Hypertension    Sarcoidosis     SOCIAL HX:  Social History   Tobacco Use   Smoking status: Never Smoker   Smokeless tobacco: Never Used  Substance Use Topics   Alcohol use: No    Alcohol/week: 0.0 standard drinks    ALLERGIES:  Allergies  Allergen Reactions   Celebrex [Celecoxib] Nausea And Vomiting   Vioxx [Rofecoxib] Nausea And Vomiting     PERTINENT MEDICATIONS:  Outpatient Encounter Medications as of 11/28/2018  Medication Sig   ACCU-CHEK COMPACT TEST DRUM test strip    ACCU-CHEK SOFTCLIX LANCETS lancets ONE STEP   amoxicillin (AMOXIL) 500 MG tablet Take 1 tablet (500 mg total) by mouth 2 (two) times daily.   aspirin 81 MG tablet Take 81 mg by mouth daily.   atorvastatin (LIPITOR) 40 MG tablet 07/18/17 TAKE 1 TABLET BY MOUTH AT BEDTIME (Patient taking differently: No sig reported)   brimonidine (ALPHAGAN P) 0.1 % SOLN Apply 1 drop to eye every 8 (eight) hours.    Calcium Carbonate-Vitamin D (CALCIUM 600+D PO) Take 1 tablet by mouth daily.    carvedilol (COREG) 25 MG tablet Take 1 tablet (25 mg total) by mouth 2 (two) times daily.   Chlorphen-Pseudoephed-APAP (CORICIDIN D PO) Take 1 tablet by mouth 2 (two) times daily. cough   Cholecalciferol (D3-50 PO) Take 50 mg  by mouth daily.   clonazePAM (KLONOPIN) 0.5 MG tablet Take 0.5-1 tablets (0.25-0.5 mg total) by mouth at bedtime as needed.   donepezil (ARICEPT) 5 MG tablet TAKE 1 TABLET BY MOUTH EVERY DAY IN THE EVENING   dorzolamide (TRUSOPT) 2 % ophthalmic solution Place 1 drop into both eyes 2 (two) times daily.    furosemide (LASIX) 40 MG tablet TAKE 1 TABLET BY MOUTH EVERY DAY   ipratropium (ATROVENT) 0.06 % nasal spray Place 2 sprays into both nostrils every 8 (eight) hours as needed.    KLOR-CON M20 20 MEQ tablet TAKE 1 TABLET BY MOUTH EVERY DAY WITH FOOD   LUMIGAN 0.01 % SOLN Place 1 drop into the left eye at bedtime.    Melatonin 10 MG TBCR Take 1 tablet by mouth at bedtime.   Multiple Vitamin (MULTIVITAMIN WITH MINERALS) TABS tablet Take 1 tablet by mouth daily.   Multiple Vitamins-Minerals (PRESERVISION AREDS 2 PO) Take 1 tablet by mouth daily.   Omega-3 Fatty Acids (FISH OIL) 1000 MG CAPS Take 1,000 mg by mouth daily.   sertraline (ZOLOFT) 50 MG tablet Take 1 tablet (50 mg total) by mouth daily.   telmisartan (MICARDIS) 80 MG tablet TAKE 1 TABLET BY MOUTH EVERY DAY   Turmeric (CVS TURMERIC CURCUMIN) 500 MG CAPS Take 1 tablet by mouth daily.   No facility-administered encounter medications on file  as of 11/28/2018.     PHYSICAL EXAM:   Deferred; telephonic visit without visual aids.  Anselm Lis, NP

## 2018-11-29 ENCOUNTER — Ambulatory Visit: Payer: Medicare Other | Admitting: Neurology

## 2018-12-07 ENCOUNTER — Telehealth: Payer: Self-pay

## 2018-12-07 NOTE — Telephone Encounter (Signed)
Returned pts call about mucus being worse and keeping her up at night. Trying to make a virtual visit. Left message.  Family member also has concerns about medical equipment

## 2018-12-14 ENCOUNTER — Other Ambulatory Visit: Payer: Self-pay

## 2018-12-14 ENCOUNTER — Ambulatory Visit: Payer: Medicare Other | Admitting: Nurse Practitioner

## 2018-12-14 ENCOUNTER — Other Ambulatory Visit: Payer: Self-pay | Admitting: Nurse Practitioner

## 2018-12-14 ENCOUNTER — Encounter: Payer: Self-pay | Admitting: Nurse Practitioner

## 2018-12-14 ENCOUNTER — Telehealth: Payer: Self-pay

## 2018-12-14 VITALS — BP 115/60 | HR 50 | Temp 98.2°F | Ht 60.0 in | Wt 185.0 lb

## 2018-12-14 DIAGNOSIS — R5381 Other malaise: Secondary | ICD-10-CM

## 2018-12-14 DIAGNOSIS — J309 Allergic rhinitis, unspecified: Secondary | ICD-10-CM | POA: Diagnosis not present

## 2018-12-14 DIAGNOSIS — R410 Disorientation, unspecified: Secondary | ICD-10-CM | POA: Diagnosis not present

## 2018-12-14 DIAGNOSIS — R2689 Other abnormalities of gait and mobility: Secondary | ICD-10-CM | POA: Diagnosis not present

## 2018-12-14 DIAGNOSIS — R300 Dysuria: Secondary | ICD-10-CM | POA: Diagnosis not present

## 2018-12-14 LAB — POCT URINALYSIS DIPSTICK
Bilirubin, UA: NEGATIVE
Blood, UA: NEGATIVE
Glucose, UA: NEGATIVE
Ketones, UA: NEGATIVE
Nitrite, UA: NEGATIVE
Protein, UA: NEGATIVE
Spec Grav, UA: 1.015 (ref 1.010–1.025)
Urobilinogen, UA: 0.2 E.U./dL
pH, UA: 6 (ref 5.0–8.0)

## 2018-12-14 NOTE — Progress Notes (Addendum)
Virtual Visit via telephone    This visit type was conducted due to national recommendations for restrictions regarding the COVID-19 Pandemic (e.g. social distancing) in an effort to limit this patient's exposure and mitigate transmission in our community.  Patients identity confirmed using two different identifiers.  This format is felt to be most appropriate for this patient at this time.  All issues noted in this document were discussed and addressed.  No physical exam was performed (except for noted visual exam findings with Video Visits).    Date:  12/14/2018   ID:  Connie Poole, DOB Oct 21, 1920, MRN [id number]  Patient Location:  Home - spoke with Connie Poole   Provider location:   Office    Chief Complaint:  dysuria  History of Present Illness:    Connie Poole is a 85 y.o. female who presents via video conferencing for a telehealth visit today.    The patient does not have symptoms concerning for COVID-19 infection (fever, chills, cough, or new shortness of breath).   She was treated with amoxicillin started on April 2 - April 9th.  Concerned due to her mother having more confusion.   She said her rectum is burning.  She does report a history of hemorrhoid once she used the cream.  She is on flaxseed, tea. Her daughter feels she is straining.    Dysuria   This is a new problem. Pertinent negatives include no chills.     Past Medical History:  Diagnosis Date  . Arthritis   . CHF (congestive heart failure) (HCC)   . Dementia (HCC)   . Diabetes mellitus   . Glaucoma   . Hyperlipidemia   . Hypertension   . Sarcoidosis    Past Surgical History:  Procedure Laterality Date  . ABDOMINAL HYSTERECTOMY    . TOTAL KNEE ARTHROPLASTY       Current Meds  Medication Sig  . ACCU-CHEK COMPACT TEST DRUM test strip   . ACCU-CHEK SOFTCLIX LANCETS lancets ONE STEP  . aspirin 81 MG tablet Take 81 mg by mouth daily.  Marland Kitchen atorvastatin (LIPITOR) 40 MG tablet  07/18/17 TAKE 1 TABLET BY MOUTH AT BEDTIME (Patient taking differently: No sig reported)  . brimonidine (ALPHAGAN P) 0.1 % SOLN Apply 1 drop to eye every 8 (eight) hours.   . Calcium Carbonate-Vitamin D (CALCIUM 600+D PO) Take 1 tablet by mouth daily.   . carvedilol (COREG) 25 MG tablet Take 1 tablet (25 mg total) by mouth 2 (two) times daily.  . Chlorphen-Pseudoephed-APAP (CORICIDIN D PO) Take 1 tablet by mouth as needed. cough   . Cholecalciferol (D3-50 PO) Take 50 mg by mouth daily.  . clonazePAM (KLONOPIN) 0.5 MG tablet Take 0.5-1 tablets (0.25-0.5 mg total) by mouth at bedtime as needed.  . donepezil (ARICEPT) 5 MG tablet TAKE 1 TABLET BY MOUTH EVERY DAY IN THE EVENING  . dorzolamide (TRUSOPT) 2 % ophthalmic solution Place 1 drop into both eyes 2 (two) times daily.   . furosemide (LASIX) 40 MG tablet TAKE 1 TABLET BY MOUTH EVERY DAY  . ipratropium (ATROVENT) 0.06 % nasal spray Place 2 sprays into both nostrils every 8 (eight) hours as needed.   Marland Kitchen KLOR-CON M20 20 MEQ tablet TAKE 1 TABLET BY MOUTH EVERY DAY WITH FOOD  . LUMIGAN 0.01 % SOLN Place 1 drop into the left eye at bedtime.   . Melatonin 10 MG TBCR Take 1 tablet by mouth at bedtime.  . Multiple Vitamins-Minerals (PRESERVISION  AREDS 2 PO) Take 1 tablet by mouth daily.  . Omega-3 Fatty Acids (FISH OIL) 1000 MG CAPS Take 1,000 mg by mouth daily.  . sertraline (ZOLOFT) 50 MG tablet Take 1 tablet (50 mg total) by mouth daily.  Marland Kitchen telmisartan (MICARDIS) 80 MG tablet TAKE 1 TABLET BY MOUTH EVERY DAY  . Turmeric (CVS TURMERIC CURCUMIN) 500 MG CAPS Take 1 tablet by mouth daily.  . [DISCONTINUED] Multiple Vitamin (MULTIVITAMIN WITH MINERALS) TABS tablet Take 1 tablet by mouth daily.     Allergies:   Celebrex [celecoxib] and Vioxx [rofecoxib]   Social History   Tobacco Use  . Smoking status: Never Smoker  . Smokeless tobacco: Never Used  Substance Use Topics  . Alcohol use: No    Alcohol/week: 0.0 standard drinks  . Drug use: Not  Currently     Family Hx: The patient's family history includes Alzheimer's disease in an other family member; Cancer in her brother; Coronary artery disease in an other family member; Diabetes in an other family member; Heart disease in her father; Hypertension in an other family member; Prostate cancer in her brother; Stroke in her mother; Throat cancer in her brother.  ROS:   Please see the history of present illness.    Review of Systems  Constitutional: Negative for chills.  Respiratory: Negative.   Cardiovascular: Negative.   Genitourinary: Positive for dysuria.    All other systems reviewed and are negative.   Labs/Other Tests and Data Reviewed:    Recent Labs: 09/27/2018: ALT 10; BUN 38; Creatinine, Ser 1.42; Hemoglobin 9.0; Platelets 309; Potassium 4.5; Sodium 141   Recent Lipid Panel Lab Results  Component Value Date/Time   CHOL 155 09/27/2018 03:54 PM   TRIG 100 09/27/2018 03:54 PM   HDL 36 (L) 09/27/2018 03:54 PM   CHOLHDL 4.3 09/27/2018 03:54 PM   LDLCALC 99 09/27/2018 03:54 PM    Wt Readings from Last 3 Encounters:  12/14/18 185 lb (83.9 kg)  12/22/17 194 lb (88 kg)  05/26/16 194 lb 9.6 oz (88.3 kg)     Exam:    Vital Signs:  BP 115/60 (BP Location: Left Arm, Patient Position: Sitting, Cuff Size: Small)   Pulse (!) 50   Temp 98.2 F (36.8 C)   Ht 5' (1.524 m)   Wt 185 lb (83.9 kg)   SpO2 95%   BMI 36.13 kg/m     Physical Exam  Constitutional: She is oriented to person, place, and time. No distress.  Neurological: She is alert and oriented to person, place, and time.  Psychiatric: Mood, memory, affect and judgment normal.    ASSESSMENT & PLAN:     1. Dysuria  Urinalysis trace leuk  Will send for culture due to recent UTI - POCT Urinalysis Dipstick (81002) - Culture, Urine  2. Confusion  Daughter reports confusion and is concerned she may have a UTI will send urinalysis for culture  3. Allergic rhinitis, unspecified seasonality,  unspecified trigger  Encouraged to treat with ipratropium nasal spray as needed  4. Decreased mobility  It is more difficult to get the patient in and out of the bed.  She requires a hospital bed for better positioning and comfort this will improve her quality of life.  The patient is requires sit to stand with positioning and she needs more one person for transfers. She also requires a reclining wheelchair for transportation and a cane or walker will not suffice.    5. Debility  Necessary for her to have  a hospital bed for repositioning requiring sit to stand with position and transfers   COVID-19 Education: The signs and symptoms of COVID-19 were discussed with the patient and how to seek care for testing (follow up with PCP or arrange E-visit).  The importance of social distancing was discussed today.  Patient Risk:   After full review of this patients clinical status, I feel that they are at least moderate risk at this time.  Time:   Today, I have spent 17 minutes/ seconds with the patient with telehealth technology discussing above diagnoses.     Medication Adjustments/Labs and Tests Ordered: Current medicines are reviewed at length with the patient today.  Concerns regarding medicines are outlined above.   Tests Ordered: Orders Placed This Encounter  Procedures  . POCT Urinalysis Dipstick (81002)    Medication Changes: No orders of the defined types were placed in this encounter.   Disposition:  Follow up in 3 month(s)  Signed, Arnette FeltsJanece Jacari Iannello, FNP

## 2018-12-14 NOTE — Telephone Encounter (Signed)
I returned the pt's daughter call and scheduled her mother a virtual appt with her consent.

## 2018-12-16 LAB — URINE CULTURE

## 2018-12-20 ENCOUNTER — Other Ambulatory Visit: Payer: Self-pay | Admitting: Nurse Practitioner

## 2018-12-20 ENCOUNTER — Other Ambulatory Visit: Payer: Self-pay

## 2018-12-20 DIAGNOSIS — R300 Dysuria: Secondary | ICD-10-CM

## 2018-12-22 LAB — URINE CULTURE

## 2018-12-25 DIAGNOSIS — R2689 Other abnormalities of gait and mobility: Secondary | ICD-10-CM | POA: Insufficient documentation

## 2018-12-25 DIAGNOSIS — J309 Allergic rhinitis, unspecified: Secondary | ICD-10-CM | POA: Insufficient documentation

## 2018-12-25 DIAGNOSIS — R300 Dysuria: Secondary | ICD-10-CM | POA: Insufficient documentation

## 2018-12-25 DIAGNOSIS — R5381 Other malaise: Secondary | ICD-10-CM | POA: Insufficient documentation

## 2018-12-27 ENCOUNTER — Encounter: Payer: Self-pay | Admitting: Nurse Practitioner

## 2018-12-27 ENCOUNTER — Other Ambulatory Visit: Payer: Self-pay | Admitting: Nurse Practitioner

## 2018-12-27 DIAGNOSIS — R300 Dysuria: Secondary | ICD-10-CM

## 2018-12-27 DIAGNOSIS — R410 Disorientation, unspecified: Secondary | ICD-10-CM

## 2018-12-27 MED ORDER — AMOXICILLIN-POT CLAVULANATE 500-125 MG PO TABS: 1.0000 | ORAL_TABLET | Freq: Two times a day (BID) | ORAL | 0 refills | Status: DC

## 2018-12-28 ENCOUNTER — Other Ambulatory Visit: Payer: Medicare Other | Admitting: Internal Medicine

## 2018-12-28 ENCOUNTER — Other Ambulatory Visit: Payer: Self-pay

## 2018-12-28 ENCOUNTER — Encounter: Payer: Self-pay | Admitting: Internal Medicine

## 2018-12-28 DIAGNOSIS — Z515 Encounter for palliative care: Secondary | ICD-10-CM

## 2018-12-28 NOTE — Progress Notes (Signed)
May 14th, 2020 [facility] Telephone: [phone] Fax: [phone]  Due to the current COVID-19 infection/crises, the patient and family prefer, and have given their verbal consent for, a provider visit via telemedicine. HIPPA policies of confidentially were discussed anddaughter. Video-audio (telehealth) contact was unable to be done due technical barriers from the patients side.  PATIENT NAME:Connie Mikki HarborB Poole DOB:Jan 06, 1921 MVH:[id number]  PRIMARY CARE PROVIDER:Sanders, Melina Schoolsobyn, MD  REFERRING PROVIDER:Sanders, Melina Schoolsobyn, MD [address] [place] [id number]  RESPONSIBLE PARTY:Connie Poole (daughter) [phone]  1. Dementia with behavioral disturbances(hallucinations and hollering out at night): Started on another round of antibiotics for possible UTI (2 urine samples were contaminated) in the hopes that this would improve patient's confusion. Daughter recognizes that the confusion may also be d/t changes associated with progression of dementia. Patient is easily reoriented to place/family members.  Daughter reports patient continues stable on current regimen of Klonopin 0.5mg  bid, and Zoloft 50mg  qd. Patient is pleasantly conversant. A and O to self and place. She is conitinent of bowel and bladder, and is able to feed herself.She is a heavy one person transfer. Patient naps and dozes off in her wheelchair during the day, and may be up for hours at night hollering out,  disrupting daughter's sleep. Again, daughter is hesitant ot use low dose Seroquel d/t potential side effects in the elderly. Daughter leaves the TV on in patient's room at night ([place] station) for company.Patient's neurologist shared with dtr that d/t pat's cognitive decline, she may not be able to differentiate her dreams from reality. Dtr has employed a caregiver who will sit with the patient 2 nights /week, which helps dtr catch up on some  rest.   2. Sedentary due to deconditioning and arthritic joint pain (knees).PT has resumed visits; patient continues on/off this service as insurance allows (available via some grant monies). Patient is able to walk with walker "around the circle" within the home. Ambulation limited by arthritic knees. Patient with adequate pain management on Tylenol 500mg 2tabs/day, and tumeric. Daughter has stopped patient's Coricidin for now, as patient has just started an antibiotic for UTI, and dtr doesn't want to give pt too many medications at one time.  Patient's function is stable, as aone person assist to stand; can pivotand walk with walkerwithaslow shuffling gait.Resolution of small stage 2 pressure injury on her coccyx. Patient does get up to go to the bathroom (with assist), so she gets some position changes.  3. CHF: Appears compensated by dtr report.  4. Follow up: daughter requests f/u NP visit q 4-6 weeks. Next scheduled telehealth visit Thurs June 11th at 3pm.  5. Advanced Care Directives: DNR and MOST form in place  I spent6830minutes providing this consultation, from 4pmto4:30pm. More than 50% of the time in this consultation was spent coordinating communication, interview of daughter and patient, reconciling medication list, and charting.  HPI/INTERVAL NOTE: Sweet 85yo AA female with h/o Lewy Body dementia with behavioral disturbances, HTN, DM, and CHF. Routine Palliative Care follow upfrom 4/14/20to assist with symptom management.  CODE STATUS:DNR-comfort; no antibiotic's, no IVF's no feeding tube  PPS:30%  HOSPICE ELIGIBILITY/DIAGNOSIS:not at this time, as prognosis thought greater than 6 months.  PAST MEDICAL HISTORY:  Past Medical History:  Diagnosis Date   Arthritis    CHF (congestive heart failure) (HCC)    Dementia (HCC)    Diabetes mellitus    Glaucoma    Hyperlipidemia    Hypertension    Sarcoidosis  SOCIAL HX:  Social History    Tobacco Use   Smoking status: Never Smoker   Smokeless tobacco: Never Used  Substance Use Topics   Alcohol use: No    Alcohol/week: 0.0 standard drinks    ALLERGIES:  Allergies  Allergen Reactions   Celebrex [Celecoxib] Nausea And Vomiting   Vioxx [Rofecoxib] Nausea And Vomiting     PERTINENT MEDICATIONS:  Outpatient Encounter Medications as of 12/28/2018  Medication Sig   ACCU-CHEK COMPACT TEST DRUM test strip    ACCU-CHEK SOFTCLIX LANCETS lancets ONE STEP   amoxicillin-clavulanate (AUGMENTIN) 500-125 MG tablet Take 1 tablet (500 mg total) by mouth 2 (two) times a day.   aspirin 81 MG tablet Take 81 mg by mouth daily.   atorvastatin (LIPITOR) 40 MG tablet 07/18/17 TAKE 1 TABLET BY MOUTH AT BEDTIME (Patient taking differently: No sig reported)   brimonidine (ALPHAGAN P) 0.1 % SOLN Apply 1 drop to eye every 8 (eight) hours.    Calcium Carbonate-Vitamin D (CALCIUM 600+D PO) Take 1 tablet by mouth daily.    carvedilol (COREG) 25 MG tablet Take 1 tablet (25 mg total) by mouth 2 (two) times daily.   Chlorphen-Pseudoephed-APAP (CORICIDIN D PO) Take 1 tablet by mouth as needed. cough    Cholecalciferol (D3-50 PO) Take 50 mg by mouth daily.   clonazePAM (KLONOPIN) 0.5 MG tablet Take 0.5-1 tablets (0.25-0.5 mg total) by mouth at bedtime as needed.   donepezil (ARICEPT) 5 MG tablet TAKE 1 TABLET BY MOUTH EVERY DAY IN THE EVENING   dorzolamide (TRUSOPT) 2 % ophthalmic solution Place 1 drop into both eyes 2 (two) times daily.    furosemide (LASIX) 40 MG tablet TAKE 1 TABLET BY MOUTH EVERY DAY   ipratropium (ATROVENT) 0.06 % nasal spray Place 2 sprays into both nostrils every 8 (eight) hours as needed.    KLOR-CON M20 20 MEQ tablet TAKE 1 TABLET BY MOUTH EVERY DAY WITH FOOD   LUMIGAN 0.01 % SOLN Place 1 drop into the left eye at bedtime.    Melatonin 10 MG TBCR Take 1 tablet by mouth at bedtime.   Multiple Vitamins-Minerals (PRESERVISION AREDS 2 PO) Take 1 tablet by  mouth daily.   Omega-3 Fatty Acids (FISH OIL) 1000 MG CAPS Take 1,000 mg by mouth daily.   sertraline (ZOLOFT) 50 MG tablet Take 1 tablet (50 mg total) by mouth daily.   telmisartan (MICARDIS) 80 MG tablet TAKE 1 TABLET BY MOUTH EVERY DAY   Turmeric (CVS TURMERIC CURCUMIN) 500 MG CAPS Take 1 tablet by mouth daily.   No facility-administered encounter medications on file as of 12/28/2018.     PHYSICAL EXAM:   Very sweet elderly, well nourished AA female dozing in wheelchair; awoke easily to voice and light tough.  Pleasantly conversant. No respiratory distress with conversation.  Mild LE swelling, which dtr states is improved.  Anselm Lis, NP

## 2018-12-29 DIAGNOSIS — Z9181 History of falling: Secondary | ICD-10-CM

## 2018-12-29 DIAGNOSIS — R2689 Other abnormalities of gait and mobility: Secondary | ICD-10-CM

## 2018-12-29 DIAGNOSIS — N183 Chronic kidney disease, stage 3 (moderate): Secondary | ICD-10-CM

## 2018-12-29 DIAGNOSIS — E1122 Type 2 diabetes mellitus with diabetic chronic kidney disease: Secondary | ICD-10-CM

## 2018-12-29 DIAGNOSIS — N390 Urinary tract infection, site not specified: Secondary | ICD-10-CM | POA: Diagnosis not present

## 2018-12-29 DIAGNOSIS — F0390 Unspecified dementia without behavioral disturbance: Secondary | ICD-10-CM | POA: Diagnosis not present

## 2018-12-29 DIAGNOSIS — H548 Legal blindness, as defined in USA: Secondary | ICD-10-CM

## 2018-12-29 DIAGNOSIS — F330 Major depressive disorder, recurrent, mild: Secondary | ICD-10-CM

## 2018-12-29 DIAGNOSIS — Z6841 Body Mass Index (BMI) 40.0 and over, adult: Secondary | ICD-10-CM

## 2018-12-29 DIAGNOSIS — M6281 Muscle weakness (generalized): Secondary | ICD-10-CM

## 2018-12-29 DIAGNOSIS — I129 Hypertensive chronic kidney disease with stage 1 through stage 4 chronic kidney disease, or unspecified chronic kidney disease: Secondary | ICD-10-CM

## 2019-01-01 ENCOUNTER — Telehealth: Payer: Self-pay

## 2019-01-01 NOTE — Telephone Encounter (Signed)
Left a message for the pt's daughters Rosette Reveal to call the office back.  I was calling to let her know that Dr. Allyne Gee said that the pt's urine shows positive for bacteria and that dr sanders is awaiting the culture results.

## 2019-01-02 ENCOUNTER — Telehealth: Payer: Self-pay

## 2019-01-02 NOTE — Telephone Encounter (Signed)
I called to let the pt's [name]  And notified her that Dr. Allyne Gee said that the pt's urine shows positive for bacteria and that dr sanders is awaiting the culture results

## 2019-01-03 ENCOUNTER — Other Ambulatory Visit: Payer: Self-pay | Admitting: Internal Medicine

## 2019-01-04 ENCOUNTER — Other Ambulatory Visit: Payer: Self-pay | Admitting: Internal Medicine

## 2019-01-04 MED ORDER — CIPROFLOXACIN HCL 250 MG PO TABS: 250.0000 mg | ORAL_TABLET | Freq: Two times a day (BID) | ORAL | 0 refills | Status: AC

## 2019-01-12 ENCOUNTER — Encounter: Payer: Self-pay | Admitting: Internal Medicine

## 2019-01-24 ENCOUNTER — Ambulatory Visit: Payer: Medicare Other | Admitting: Internal Medicine

## 2019-01-25 ENCOUNTER — Other Ambulatory Visit: Payer: Medicare Other | Admitting: Internal Medicine

## 2019-01-25 ENCOUNTER — Encounter: Payer: Self-pay | Admitting: Internal Medicine

## 2019-01-25 ENCOUNTER — Other Ambulatory Visit: Payer: Self-pay

## 2019-01-25 ENCOUNTER — Ambulatory Visit: Payer: Medicare Other | Admitting: Internal Medicine

## 2019-01-25 DIAGNOSIS — Z515 Encounter for palliative care: Secondary | ICD-10-CM

## 2019-01-25 DIAGNOSIS — G4752 REM sleep behavior disorder: Secondary | ICD-10-CM

## 2019-01-25 NOTE — Progress Notes (Signed)
June 11th, 2020 [place] Telephone: [phone] Fax: [phone]  Due to the current COVID-19 infection/crises, the patient and family prefer, and have given their verbal consent for, a provider visit via telemedicine. HIPPA policies of confidentially were discussed anddaughter. Video-audio (telehealth) contact was unable to be done due technical barriers from the patients side.  PATIENT NAME:Connie Poole DOB:Jan 14, 1921 [id number]:[id number]  PRIMARY CARE PROVIDER:Sanders, Bailey Mech, MD  REFERRING PROVIDER:Sanders, Bailey Mech, [address] [place], [place] [address]  RESPONSIBLE PARTY:[name] Suzan Garibaldi (daughter) [phone]  1. Dementia with behavioral disturbances(hallucinations and hollering out at night):PCG Daughter [name] reports patient has been quieter these last few weeks; no longer having stressful dreams that she can't differentiate from reality, and not hollering out at night. [name] still keeps a frequent check on her throughout the night. Continues regimen of Klonopin 0.5mg  bid, and Zoloft 50mg  qd. Overall, continues to recognize family members. Patient's demeanor continues cooperative and sweet. Patient completed a short course of Cipro for recurrent UTI, and there is a plan to obtain a f/u urine C&S to ensure resolved. Patient is a little more confused towards sundown (calls out/talks to dead relatives); appears a slow progressive cognitive decline. Continues continent of bladder and bowel for the most part, and is able to feed herself, and doesn't appear to have lost weight. Patient continues a one person assist to transfer, and walks with walker and guiding assist, slow shuffling gait.   2. Family supports: Daughter recognizes the progressive nature of her mom's dementia. [name] finds her care-giving strength for taking care of her mom and elderly M-I-L through her faith. [name] is cognizant of the need to  not let her own life slip away without accomplishing the things she wants to do in her own life, as well. "When I can't do this anymore, I will let everyone know!" Because she and her husband are the oldest children in their household, they anticipated that they would be providing the elder care.There is some paid caregivers that assist with some day and night time care.  3. Follow up: daughter requests f/u NP visit q4-6 weeks. Next scheduledtelehealth visit Thurs July 9th at 3pm.  4. Advanced Care Directives:  [facility] MOST form in place  I spent79minutes providing this consultation, from 4pmto4:30pm. More than 50% of the time in this consultation was spent coordinating communication, interview of daughter and patient, reconciling medication list, and charting.  HPI/INTERVAL NOTE: Sweet 85yo AA female with h/o Lewy Body dementia with behavioral disturbances, HTN, DM, and CHF. Routine Palliative Care follow upfrom5/14/20to assist with symptom management.  CODE STATUS:DNR-comfort; no antibiotic's, no IVF's no feeding tube  PPS:30%  HOSPICE ELIGIBILITY/DIAGNOSIS:not at this time, as prognosis thought greater than 6 months.  PAST MEDICAL HISTORY:  Past Medical History:  Diagnosis Date   Arthritis    CHF (congestive heart failure) (HCC)    Dementia (HCC)    Diabetes mellitus    Glaucoma    Hyperlipidemia    Hypertension    Sarcoidosis     SOCIAL HX:  Social History   Tobacco Use   Smoking status: Never Smoker   Smokeless tobacco: Never Used  Substance Use Topics   Alcohol use: No    Alcohol/week: 0.0 standard drinks    ALLERGIES:  Allergies  Allergen Reactions   Celebrex [Celecoxib] Nausea And Vomiting   Vioxx [Rofecoxib] Nausea And Vomiting     PERTINENT MEDICATIONS:  Outpatient Encounter Medications as of 01/25/2019  Medication Sig   Cranberry-Vit  C-Probiotic-Ca (GNP CRANBERRY) TABS Take by mouth 2 (two) times daily.   ACCU-CHEK COMPACT  TEST DRUM test strip    ACCU-CHEK SOFTCLIX LANCETS lancets ONE STEP   aspirin 81 MG tablet Take 81 mg by mouth daily.   atorvastatin (LIPITOR) 40 MG tablet 07/18/17 TAKE 1 TABLET BY MOUTH AT BEDTIME (Patient taking differently: No sig reported)   brimonidine (ALPHAGAN P) 0.1 % SOLN Apply 1 drop to eye every 8 (eight) hours.    Calcium Carbonate-Vitamin D (CALCIUM 600+D PO) Take 1 tablet by mouth daily.    carvedilol (COREG) 25 MG tablet Take 1 tablet (25 mg total) by mouth 2 (two) times daily.   Chlorphen-Pseudoephed-APAP (CORICIDIN D PO) Take 1 tablet by mouth as needed. cough    Cholecalciferol (D3-50 PO) Take 50 mg by mouth daily.   clonazePAM (KLONOPIN) 0.5 MG tablet Take 0.5-1 tablets (0.25-0.5 mg total) by mouth at bedtime as needed.   donepezil (ARICEPT) 5 MG tablet TAKE 1 TABLET BY MOUTH EVERY DAY IN THE EVENING   dorzolamide (TRUSOPT) 2 % ophthalmic solution Place 1 drop into both eyes 2 (two) times daily.    furosemide (LASIX) 40 MG tablet TAKE 1 TABLET BY MOUTH EVERY DAY   ipratropium (ATROVENT) 0.06 % nasal spray Place 2 sprays into both nostrils every 8 (eight) hours as needed.    KLOR-CON M20 20 MEQ tablet TAKE 1 TABLET BY MOUTH EVERY DAY WITH FOOD   LUMIGAN 0.01 % SOLN Place 1 drop into the left eye at bedtime.    Melatonin 10 MG TBCR Take 1 tablet by mouth at bedtime.   Multiple Vitamins-Minerals (PRESERVISION AREDS 2 PO) Take 1 tablet by mouth daily.   Omega-3 Fatty Acids (FISH OIL) 1000 MG CAPS Take 1,000 mg by mouth daily.   sertraline (ZOLOFT) 50 MG tablet Take 1 tablet (50 mg total) by mouth daily.   telmisartan (MICARDIS) 80 MG tablet TAKE 1 TABLET BY MOUTH EVERY DAY   Turmeric (CVS TURMERIC CURCUMIN) 500 MG CAPS Take 1 tablet by mouth daily.   No facility-administered encounter medications on file as of 01/25/2019.     PHYSICAL EXAM:   Deferred d/t tele-phonic nature of telehealth visit.  Anselm LisMary P Teneil Shiller, NP

## 2019-01-29 ENCOUNTER — Encounter: Payer: Self-pay | Admitting: Internal Medicine

## 2019-01-29 ENCOUNTER — Other Ambulatory Visit: Payer: Self-pay

## 2019-01-29 ENCOUNTER — Ambulatory Visit (INDEPENDENT_AMBULATORY_CARE_PROVIDER_SITE_OTHER): Payer: Medicare Other | Admitting: Internal Medicine

## 2019-01-29 VITALS — BP 119/49 | HR 53 | Temp 96.2°F | Ht 60.0 in

## 2019-01-29 DIAGNOSIS — E1122 Type 2 diabetes mellitus with diabetic chronic kidney disease: Secondary | ICD-10-CM

## 2019-01-29 DIAGNOSIS — N183 Chronic kidney disease, stage 3 (moderate): Secondary | ICD-10-CM

## 2019-01-29 DIAGNOSIS — N390 Urinary tract infection, site not specified: Secondary | ICD-10-CM | POA: Insufficient documentation

## 2019-01-29 DIAGNOSIS — K068 Other specified disorders of gingiva and edentulous alveolar ridge: Secondary | ICD-10-CM

## 2019-01-29 DIAGNOSIS — R410 Disorientation, unspecified: Secondary | ICD-10-CM

## 2019-01-29 DIAGNOSIS — R2689 Other abnormalities of gait and mobility: Secondary | ICD-10-CM

## 2019-01-29 DIAGNOSIS — I129 Hypertensive chronic kidney disease with stage 1 through stage 4 chronic kidney disease, or unspecified chronic kidney disease: Secondary | ICD-10-CM | POA: Diagnosis not present

## 2019-01-29 DIAGNOSIS — Z79899 Other long term (current) drug therapy: Secondary | ICD-10-CM

## 2019-01-29 NOTE — Progress Notes (Signed)
Virtual Visit via Video   This visit type was conducted due to national recommendations for restrictions regarding the COVID-19 Pandemic (e.g. social distancing) in an effort to limit this patient's exposure and mitigate transmission in our community.  Due to her co-morbid illnesses, this patient is at least at moderate risk for complications without adequate follow up.  This format is felt to be most appropriate for this patient at this time.  All issues noted in this document were discussed and addressed.  A limited physical exam was performed with this format.    This visit type was conducted due to national recommendations for restrictions regarding the COVID-19 Pandemic (e.g. social distancing) in an effort to limit this patient's exposure and mitigate transmission in our community.  Patients identity confirmed using two different identifiers.  This format is felt to be most appropriate for this patient at this time.  All issues noted in this document were discussed and addressed.  No physical exam was performed (except for noted visual exam findings with Video Visits).    Date:  01/29/2019   ID:  Connie Poole, DOB 05-Oct-1920, MRN [id number]  Patient Location:  Home, accompanied by her daughter, Connie Poole  Provider location:   Office    Chief Complaint:  DM f/u  History of Present Illness:    Connie Poole is a 85 y.o. female who presents via video conferencing for a telehealth visit today.    The patient does not have symptoms concerning for COVID-19 infection (fever, chills, cough, or new shortness of breath).   She presents today for virtual visit. She and her daughter prefer this method of contact due to COVID-19 pandemic.  She has been at home during the pandemic.   Diabetes She presents for her follow-up diabetic visit. She has type 2 diabetes mellitus. Her disease course has been stable. There are no hypoglycemic associated symptoms. Pertinent negatives for  diabetes include no blurred vision and no chest pain. There are no hypoglycemic complications. Diabetic complications include nephropathy. Risk factors for coronary artery disease include diabetes mellitus, dyslipidemia, hypertension, obesity, post-menopausal and sedentary lifestyle. She is following a diabetic diet. She never participates in exercise. An ACE inhibitor/angiotensin II receptor blocker is being taken.  Hypertension This is a chronic problem. The current episode started more than 1 year ago. The problem has been gradually improving since onset. The problem is controlled. Pertinent negatives include no blurred vision, chest pain, palpitations or shortness of breath. The current treatment provides moderate improvement. Hypertensive end-organ damage includes kidney disease.     Past Medical History:  Diagnosis Date   Arthritis    CHF (congestive heart failure) (HCC)    Dementia (HCC)    Diabetes mellitus    Glaucoma    Hyperlipidemia    Hypertension    Sarcoidosis    Past Surgical History:  Procedure Laterality Date   ABDOMINAL HYSTERECTOMY     TOTAL KNEE ARTHROPLASTY       Current Meds  Medication Sig   ACCU-CHEK COMPACT TEST DRUM test strip    ACCU-CHEK SOFTCLIX LANCETS lancets ONE STEP   aspirin 81 MG tablet Take 81 mg by mouth daily.   atorvastatin (LIPITOR) 40 MG tablet 07/18/17 TAKE 1 TABLET BY MOUTH AT BEDTIME (Patient taking differently: No sig reported)   brimonidine (ALPHAGAN P) 0.1 % SOLN Apply 1 drop to eye every 8 (eight) hours.    Calcium Carbonate-Vitamin D (CALCIUM 600+D PO) Take 1 tablet by mouth daily.  carvedilol (COREG) 25 MG tablet Take 1 tablet (25 mg total) by mouth 2 (two) times daily.   Chlorphen-Pseudoephed-APAP (CORICIDIN D PO) Take 1 tablet by mouth as needed. cough    Cholecalciferol (D3-50 PO) Take 50 mg by mouth daily.   clonazePAM (KLONOPIN) 0.5 MG tablet Take 0.5-1 tablets (0.25-0.5 mg total) by mouth at bedtime as  needed.   Cranberry-Vit C-Probiotic-Ca (GNP CRANBERRY) TABS Take by mouth 2 (two) times daily.   donepezil (ARICEPT) 5 MG tablet TAKE 1 TABLET BY MOUTH EVERY DAY IN THE EVENING   dorzolamide (TRUSOPT) 2 % ophthalmic solution Place 1 drop into both eyes 2 (two) times daily.    furosemide (LASIX) 40 MG tablet TAKE 1 TABLET BY MOUTH EVERY DAY   ipratropium (ATROVENT) 0.06 % nasal spray Place 2 sprays into both nostrils every 8 (eight) hours as needed.    KLOR-CON M20 20 MEQ tablet TAKE 1 TABLET BY MOUTH EVERY DAY WITH FOOD   LUMIGAN 0.01 % SOLN Place 1 drop into the left eye at bedtime.    Melatonin 10 MG TBCR Take 1 tablet by mouth at bedtime.   Multiple Vitamins-Minerals (PRESERVISION AREDS 2 PO) Take 1 tablet by mouth daily.   Omega-3 Fatty Acids (FISH OIL) 1000 MG CAPS Take 1,000 mg by mouth daily.   sertraline (ZOLOFT) 50 MG tablet Take 1 tablet (50 mg total) by mouth daily.   telmisartan (MICARDIS) 80 MG tablet TAKE 1 TABLET BY MOUTH EVERY DAY   Turmeric ([employer] TURMERIC CURCUMIN) 500 MG CAPS Take 1 tablet by mouth daily.     Allergies:   Celebrex [celecoxib] and Vioxx [rofecoxib]   Social History   Tobacco Use   Smoking status: Never Smoker   Smokeless tobacco: Never Used  Substance Use Topics   Alcohol use: No    Alcohol/week: 0.0 standard drinks   Drug use: Not Currently     Family Hx: The patient's family history includes Alzheimer's disease in an other family member; Cancer in her brother; Coronary artery disease in an other family member; Diabetes in an other family member; Heart disease in her father; Hypertension in an other family member; Prostate cancer in her brother; Stroke in her mother; Throat cancer in her brother.  ROS:   Please see the history of present illness.    Review of Systems  Constitutional: Negative.   HENT:       Daughter states she complains of gum pain. She has not noticed any ulcers.   Eyes: Negative for blurred vision.    Respiratory: Negative.  Negative for shortness of breath.   Cardiovascular: Negative.  Negative for chest pain and palpitations.  Gastrointestinal: Negative.   Neurological: Negative.        Pt w/ h/o dementia. Daughter feels she has been more confused recently. In the past, this has usually been due to a UTI. Recently treated for UTI w/ Cipro based on culture/sensitivity results - pt took entire abx course.   Psychiatric/Behavioral: Negative.     All other systems reviewed and are negative.   Labs/Other Tests and Data Reviewed:    Recent Labs: 09/27/2018: ALT 10; BUN 38; Creatinine, Ser 1.42; Hemoglobin 9.0; Platelets 309; Potassium 4.5; Sodium 141   Recent Lipid Panel Lab Results  Component Value Date/Time   CHOL 155 09/27/2018 03:54 PM   TRIG 100 09/27/2018 03:54 PM   HDL 36 (L) 09/27/2018 03:54 PM   CHOLHDL 4.3 09/27/2018 03:54 PM   LDLCALC 99 09/27/2018 03:54 PM  Wt Readings from Last 3 Encounters:  12/14/18 185 lb (83.9 kg)  12/22/17 194 lb (88 kg)  05/26/16 194 lb 9.6 oz (88.3 kg)     Exam:    Vital Signs:  BP (!) 119/49 Comment: pt provided   Pulse (!) 53 Comment: pt provided   Temp (!) 96.2 F (35.7 C) (Oral) Comment: pt provided   Ht 5' (1.524 m)    BMI 36.13 kg/m     Physical Exam  Constitutional: She is well-developed, well-nourished, and in no distress.  HENT:  Head: Normocephalic and atraumatic.  Neck: Normal range of motion.  Pulmonary/Chest: Effort normal.  Neurological: She is alert.  Psychiatric: Affect normal.  Nursing note and vitals reviewed.   ASSESSMENT & PLAN:     1. Type 2 diabetes mellitus with stage 3 chronic kidney disease, without long-term current use of insulin (HCC)  Chronic. She agrees to come in later this week for labwork. I will check labs as listed below.   - CMP14+EGFR; Future - Hemoglobin A1c; Future  2. Hypertensive nephropathy  Chronic. Well controlled. She will continue with current meds. She is encouraged to  avoid adding salt to her foods.   3. Decreased mobility  Chronic. She is currently in physical therapy with [facility]. She is encouraged to continue with this and to perform exercises even on her off days.   4. Recurrent UTI  I plan to check repeat urine culture on Wednesday when she comes in for labs.   - Culture, Urine; Future - POCT Urinalysis Dipstick (81002)  5. Confusion  I will check these labs later this week. I will also check repeat u/a.   - CBC with Diff; Future - TSH; Future  6. Pain in gums  Rx magic mouthwash was called into her pharmacy to swish/spit tid as needed. She will let me know if her sx persist.   7. Drug therapy  - Vitamin B12; Future    COVID-19 Education: The signs and symptoms of COVID-19 were discussed with the patient and how to seek care for testing (follow up with PCP or arrange E-visit).  The importance of social distancing was discussed today.  Patient Risk:   After full review of this patients clinical status, I feel that they are at least moderate risk at this time.  Time:   Today, I have spent 30 minutes/ seconds with the patient with telehealth technology discussing above diagnoses.     Medication Adjustments/Labs and Tests Ordered: Current medicines are reviewed at length with the patient today.  Concerns regarding medicines are outlined above.   Tests Ordered: Orders Placed This Encounter  Procedures   Culture, Urine   CMP14+EGFR   Hemoglobin A1c   CBC with Diff   TSH   Vitamin B12   POCT Urinalysis Dipstick (81002)    Medication Changes: No orders of the defined types were placed in this encounter.   Disposition:  Follow up in 4 month(s)  Signed, Maximino Greenland, MD

## 2019-01-30 ENCOUNTER — Ambulatory Visit: Payer: Self-pay

## 2019-01-30 DIAGNOSIS — R410 Disorientation, unspecified: Secondary | ICD-10-CM

## 2019-01-30 DIAGNOSIS — N183 Chronic kidney disease, stage 3 unspecified: Secondary | ICD-10-CM

## 2019-01-30 DIAGNOSIS — I129 Hypertensive chronic kidney disease with stage 1 through stage 4 chronic kidney disease, or unspecified chronic kidney disease: Secondary | ICD-10-CM

## 2019-01-30 DIAGNOSIS — E1122 Type 2 diabetes mellitus with diabetic chronic kidney disease: Secondary | ICD-10-CM

## 2019-01-30 NOTE — Chronic Care Management (AMB) (Signed)
  Chronic Care Management   Outreach Note  01/30/2019 Name: Connie Poole MRN: [id number] DOB: 11/29/1920  Referred by: Glendale Chard, MD Reason for referral : Care Coordination   An unsuccessful telephone outreach was attempted today. CCM SW attempted to contact the patients daughter, [name]. Unfortunately, [name]'s voice mailbox was full which prohibited SW from leaving a voice message. The patient was referred to the case management team by for assistance with chronic care management and care coordination.   Follow Up Plan: The care management team will reach out to the patient again over the next 10 days.   Daneen Schick, BSW, CDP Social Worker, Certified Dementia Practitioner [place] [facility] [place] [facility] [phone]  Total time spent performing care coordination and/or care management activities with the patient by phone or face to face = 5 minutes.

## 2019-01-31 ENCOUNTER — Other Ambulatory Visit: Payer: Medicare Other

## 2019-01-31 ENCOUNTER — Other Ambulatory Visit (INDEPENDENT_AMBULATORY_CARE_PROVIDER_SITE_OTHER): Payer: Medicare Other

## 2019-01-31 DIAGNOSIS — N390 Urinary tract infection, site not specified: Secondary | ICD-10-CM

## 2019-01-31 LAB — POCT URINALYSIS DIPSTICK
Bilirubin, UA: NEGATIVE
Blood, UA: NEGATIVE
Glucose, UA: NEGATIVE
Ketones, UA: NEGATIVE
Nitrite, UA: NEGATIVE
Protein, UA: NEGATIVE
Spec Grav, UA: 1.02 (ref 1.010–1.025)
Urobilinogen, UA: 0.2 E.U./dL
pH, UA: 7 (ref 5.0–8.0)

## 2019-02-01 LAB — CMP14+EGFR
ALT: 12 IU/L (ref 0–32)
AST: 14 IU/L (ref 0–40)
Albumin/Globulin Ratio: 1.7 (ref 1.2–2.2)
Albumin: 4.3 g/dL (ref 3.5–4.6)
Alkaline Phosphatase: 60 IU/L (ref 39–117)
BUN/Creatinine Ratio: 26 (ref 12–28)
BUN: 37 mg/dL — ABNORMAL HIGH (ref 10–36)
Bilirubin Total: 0.2 mg/dL (ref 0.0–1.2)
CO2: 25 mmol/L (ref 20–29)
Calcium: 9.3 mg/dL (ref 8.7–10.3)
Chloride: 102 mmol/L (ref 96–106)
Creatinine, Ser: 1.42 mg/dL — ABNORMAL HIGH (ref 0.57–1.00)
GFR calc Af Amer: 36 mL/min/{1.73_m2} — ABNORMAL LOW (ref >59)
GFR calc non Af Amer: 31 mL/min/{1.73_m2} — ABNORMAL LOW (ref >59)
Globulin, Total: 2.5 g/dL (ref 1.5–4.5)
Glucose: 144 mg/dL — ABNORMAL HIGH (ref 65–99)
Potassium: 4.8 mmol/L (ref 3.5–5.2)
Sodium: 141 mmol/L (ref 134–144)
Total Protein: 6.8 g/dL (ref 6.0–8.5)

## 2019-02-01 LAB — CBC WITH DIFFERENTIAL/PLATELET
Basophils Absolute: 0.1 10*3/uL (ref 0.0–0.2)
Basos: 1 %
EOS (ABSOLUTE): 0.3 10*3/uL (ref 0.0–0.4)
Eos: 7 %
Hematocrit: 30.6 % — ABNORMAL LOW (ref 34.0–46.6)
Hemoglobin: 9.8 g/dL — ABNORMAL LOW (ref 11.1–15.9)
Immature Grans (Abs): 0 10*3/uL (ref 0.0–0.1)
Immature Granulocytes: 0 %
Lymphocytes Absolute: 1.7 10*3/uL (ref 0.7–3.1)
Lymphs: 40 %
MCH: 27.3 pg (ref 26.6–33.0)
MCHC: 32 g/dL (ref 31.5–35.7)
MCV: 85 fL (ref 79–97)
Monocytes Absolute: 0.5 10*3/uL (ref 0.1–0.9)
Monocytes: 12 %
Neutrophils Absolute: 1.7 10*3/uL (ref 1.4–7.0)
Neutrophils: 40 %
Platelets: 314 10*3/uL (ref 150–450)
RBC: 3.59 x10E6/uL — ABNORMAL LOW (ref 3.77–5.28)
RDW: 13.1 % (ref 11.7–15.4)
WBC: 4.2 10*3/uL (ref 3.4–10.8)

## 2019-02-01 LAB — HEMOGLOBIN A1C
Est. average glucose Bld gHb Est-mCnc: 128 mg/dL
Hgb A1c MFr Bld: 6.1 % — ABNORMAL HIGH (ref 4.8–5.6)

## 2019-02-01 LAB — TSH: TSH: 1.96 u[IU]/mL (ref 0.450–4.500)

## 2019-02-01 LAB — VITAMIN B12: Vitamin B-12: >2000 pg/mL — ABNORMAL HIGH (ref 232–1245)

## 2019-02-02 LAB — URINE CULTURE

## 2019-02-05 ENCOUNTER — Ambulatory Visit: Payer: Self-pay

## 2019-02-05 NOTE — Chronic Care Management (AMB) (Signed)
  Care Management Note   Connie Poole is a 85 y.o. year old female who is a primary care patient of Glendale Chard, MD . The CM team was consulted for assistance with chronic care management.  Review of patient status, including review of consultants reports, and collaboration with appropriate care team members and the patient's provider was performed as part of comprehensive patient evaluation and provision of care management services. Telephone outreach to patient today to introduce CM services.   I reached out to Connie Poole daughter, Connie Poole whom is listed on the patients DPR, by phone today.   Connie Poole was given information about Chronic Care Management services today regarding her mothers care including:  1. CCM service includes personalized support from designated clinical staff supervised by her physician, including individualized plan of care and coordination with other care providers 2. 24/7 contact phone numbers for assistance for urgent and routine care needs. 3. Service will only be billed when office clinical staff spend 20 minutes or more in a month to coordinate care. 4. Only one practitioner may furnish and bill the service in a calendar month. 5. The patient may stop CCM services at any time (effective at the end of the month) by phone call to the office staff. 6. The patient will be responsible for cost sharing (co-pay) of up to 20% of the service fee (after annual deductible is met).  Connie Poole did not agree to services and wishes to consider information provided before deciding about enrollment in care management services.    Follow Up Plan: SW will follow up with patient by phone over the next 10 days.   Connie Poole, BSW, CDP Social Worker, Certified Dementia Practitioner [place] [facility] [place] [facility] [phone]

## 2019-02-09 ENCOUNTER — Ambulatory Visit: Payer: Self-pay

## 2019-02-09 NOTE — Chronic Care Management (AMB) (Signed)
  Chronic Care Management   Outreach Note  02/09/2019 Name: Connie Poole MRN: [id number] DOB: 16-Sep-1920  Referred by: Glendale Chard, MD Reason for referral : Care Coordination   An unsuccessful telephone outreach was attempted today. The patient was referred to the case management team by for assistance with chronic care management and care coordination.   Follow Up Plan: A HIPPA compliant phone message was left for the patient's daughter and primary caregiver [name] providing contact information and requesting a return call.  The care management team will reach out to the patient again over the next 7 days.   Daneen Schick, BSW, CDP Social Worker, Certified Dementia Practitioner [place] [facility] [place] [facility] [phone]

## 2019-02-15 ENCOUNTER — Ambulatory Visit: Payer: Self-pay

## 2019-02-15 NOTE — Chronic Care Management (AMB) (Signed)
  Care Management Note   Connie Poole is a 85 y.o. year old female who is a primary care patient of Glendale Chard, MD . The CM team was consulted for assistance with care coordination.  I placed a follow up call to the patients daughter and caregiver Connie Poole to review the chronic care management program and determine if enrollment was desired. Connie Poole reports the patient continues to be active with Palliative Care. The patient also receives a caregiver under the CAP program for 10 hours per week. CCM SW discussed resources and provided education surround the program. At this time Connie Poole does not consent to enrollment but acknowledges her ability to self-refer in the future if she feels the patient is in need of care management services.  Follow Up Plan: No further follow up planned at this time.   Connie Poole, BSW, CDP Social Worker, Certified Dementia Practitioner [place] [facility] [place] [facility] [phone]

## 2019-02-21 NOTE — Progress Notes (Signed)
Called daughter [name] for scheduled patient telehealth visit. [name] reported she was out with patient on another Dr. Visit. She will call me to reschedule; she has my contact information.  Violeta Gelinas NP-C [phone]

## 2019-02-22 ENCOUNTER — Other Ambulatory Visit: Payer: Self-pay

## 2019-02-22 ENCOUNTER — Other Ambulatory Visit: Payer: Medicare Other | Admitting: Internal Medicine

## 2019-02-22 ENCOUNTER — Other Ambulatory Visit: Payer: Self-pay | Admitting: Neurology

## 2019-02-22 DIAGNOSIS — Z515 Encounter for palliative care: Secondary | ICD-10-CM

## 2019-02-22 NOTE — Telephone Encounter (Signed)
[facility] Database Verified LR: 01-19-2019 Qty: 30 Pending appointment: 03-21-2019

## 2019-02-27 ENCOUNTER — Other Ambulatory Visit: Payer: Self-pay | Admitting: Internal Medicine

## 2019-02-27 DIAGNOSIS — F0390 Unspecified dementia without behavioral disturbance: Secondary | ICD-10-CM | POA: Diagnosis not present

## 2019-02-27 DIAGNOSIS — F330 Major depressive disorder, recurrent, mild: Secondary | ICD-10-CM

## 2019-02-27 DIAGNOSIS — Z6841 Body Mass Index (BMI) 40.0 and over, adult: Secondary | ICD-10-CM

## 2019-02-27 DIAGNOSIS — R2689 Other abnormalities of gait and mobility: Secondary | ICD-10-CM | POA: Diagnosis not present

## 2019-02-27 DIAGNOSIS — I129 Hypertensive chronic kidney disease with stage 1 through stage 4 chronic kidney disease, or unspecified chronic kidney disease: Secondary | ICD-10-CM

## 2019-02-27 DIAGNOSIS — E1122 Type 2 diabetes mellitus with diabetic chronic kidney disease: Secondary | ICD-10-CM

## 2019-02-27 DIAGNOSIS — M6281 Muscle weakness (generalized): Secondary | ICD-10-CM

## 2019-02-27 DIAGNOSIS — N183 Chronic kidney disease, stage 3 (moderate): Secondary | ICD-10-CM

## 2019-02-27 DIAGNOSIS — Z9181 History of falling: Secondary | ICD-10-CM

## 2019-02-27 DIAGNOSIS — H548 Legal blindness, as defined in USA: Secondary | ICD-10-CM

## 2019-02-27 DIAGNOSIS — N390 Urinary tract infection, site not specified: Secondary | ICD-10-CM | POA: Diagnosis not present

## 2019-03-01 ENCOUNTER — Telehealth: Payer: Self-pay

## 2019-03-01 NOTE — Telephone Encounter (Signed)
Message left for daughter, [name], to schedule a follow up visit with Palliative Care NP

## 2019-03-05 ENCOUNTER — Other Ambulatory Visit: Payer: Self-pay | Admitting: Internal Medicine

## 2019-03-05 MED ORDER — SERTRALINE HCL 50 MG PO TABS: ORAL_TABLET | ORAL | 1 refills | Status: DC

## 2019-03-13 ENCOUNTER — Telehealth: Payer: Self-pay | Admitting: Neurology

## 2019-03-13 NOTE — Telephone Encounter (Signed)
I called patient and LVM regarding rescheduling 8/5 appointment with Dr. Brett Fairy due to vacation time. Requested patient call back to reschedule.

## 2019-03-14 ENCOUNTER — Encounter: Payer: Self-pay | Admitting: Neurology

## 2019-03-14 NOTE — Telephone Encounter (Signed)
Appointment has been cancelled. Cancellation letter sent to advise. °

## 2019-03-17 ENCOUNTER — Other Ambulatory Visit: Payer: Self-pay | Admitting: Internal Medicine

## 2019-03-21 ENCOUNTER — Ambulatory Visit: Payer: Self-pay | Admitting: Neurology

## 2019-03-21 ENCOUNTER — Ambulatory Visit: Payer: Medicare Other | Admitting: Adult Health

## 2019-03-23 ENCOUNTER — Telehealth: Payer: Self-pay

## 2019-03-23 NOTE — Telephone Encounter (Signed)
I returned the pt's daughter Adrian Prince call and notified her that Dr. Baird Cancer said it's ok for the pt to take vitamin c.  Mrs. Watlington asked if it's ok to increase her mom's tylenol 500 mg intake because she has been giving the pt one with her morning meds and one with the evening meds and the pt is having quit a bit of arthritic pain.

## 2019-03-23 NOTE — Telephone Encounter (Signed)
Yes, is she giving one tablet or two twice daily? If she is giving one 500mg  tablet Tylenol twice daily, she mayincrease to 2 tabs in am and one at night for now. Let's see how this works.

## 2019-03-23 NOTE — Telephone Encounter (Signed)
I returned the pt's daughter's call Connie Poole to let her know that Dr. Dahlia Bailiff said if she is giving the pt 1 tylenol 500 mg in the morning and 1 in the evening then she can give the pt 2 tabs in the morning and 1 tab in the evening.  Mrs. Watlington said yes she was giving the pt one tab in the morning and 1 tab in the evening, she was told to let the office know if that doesn't help once she increases the tylenol.

## 2019-04-02 ENCOUNTER — Telehealth: Payer: Self-pay | Admitting: Internal Medicine

## 2019-04-02 NOTE — Telephone Encounter (Signed)
Spoke with daughter [name] and have scheduled a Telephone F/u Palliative visit for 04/10/19 @ 2:30 PM

## 2019-04-05 ENCOUNTER — Other Ambulatory Visit: Payer: Self-pay

## 2019-04-05 ENCOUNTER — Encounter: Payer: Self-pay | Admitting: Internal Medicine

## 2019-04-05 ENCOUNTER — Ambulatory Visit (INDEPENDENT_AMBULATORY_CARE_PROVIDER_SITE_OTHER): Payer: Medicare Other | Admitting: Internal Medicine

## 2019-04-05 VITALS — BP 121/51 | HR 52 | Temp 97.4°F | Ht 60.0 in

## 2019-04-05 DIAGNOSIS — R2689 Other abnormalities of gait and mobility: Secondary | ICD-10-CM

## 2019-04-05 DIAGNOSIS — Z79899 Other long term (current) drug therapy: Secondary | ICD-10-CM

## 2019-04-05 DIAGNOSIS — R451 Restlessness and agitation: Secondary | ICD-10-CM | POA: Diagnosis not present

## 2019-04-05 DIAGNOSIS — G4709 Other insomnia: Secondary | ICD-10-CM | POA: Diagnosis not present

## 2019-04-05 DIAGNOSIS — G3183 Dementia with Lewy bodies: Secondary | ICD-10-CM

## 2019-04-05 DIAGNOSIS — F0280 Dementia in other diseases classified elsewhere without behavioral disturbance: Secondary | ICD-10-CM

## 2019-04-05 NOTE — Patient Instructions (Signed)
Dementia Caregiver Guide Dementia is a term used to describe a number of symptoms that affect memory and thinking. The most common symptoms include:  Memory loss.  Trouble with language and communication.  Trouble concentrating.  Poor judgment.  Problems with reasoning.  Child-like behavior and language.  Extreme anxiety.  Angry outbursts.  Wandering from home or public places. Dementia usually gets worse slowly over time. In the early stages, people with dementia can stay independent and safe with some help. In later stages, they need help with daily tasks such as dressing, grooming, and using the bathroom. How to help the person with dementia cope Dementia can be frightening and confusing. Here are some tips to help the person with dementia cope with changes caused by the disease. General tips  Keep the person on track with his or her routine.  Try to identify areas where the person may need help.  Be supportive, patient, calm, and encouraging.  Gently remind the person that adjusting to changes takes time.  Help with the tasks that the person has asked for help with.  Keep the person involved in daily tasks and decisions as much as possible.  Encourage conversation, but try not to get frustrated or harried if the person struggles to find words or does not seem to appreciate your help. Communication tips  When the person is talking or seems frustrated, make eye contact and hold the person's hand.  Ask specific questions that need yes or no answers.  Use simple words, short sentences, and a calm voice. Only give one direction at a time.  When offering choices, limit them to just 1 or 2.  Avoid correcting the person in a negative way.  If the person is struggling to find the right words, gently try to help him or her. How to recognize symptoms of stress Symptoms of stress in caregivers include:  Feeling frustrated or angry with the person with dementia.   Denying that the person has dementia or that his or her symptoms will not improve.  Feeling hopeless and unappreciated.  Difficulty sleeping.  Difficulty concentrating.  Feeling anxious, irritable, or depressed.  Developing stress-related health problems.  Feeling like you have too little time for your own life. Follow these instructions at home:   Make sure that you and the person you are caring for: ? Get regular sleep. ? Exercise regularly. ? Eat regular, nutritious meals. ? Drink enough fluid to keep your urine clear or pale yellow. ? Take over-the-counter and prescription medicines only as told by your health care providers. ? Attend all scheduled health care appointments.  Join a support group with others who are caregivers.  Ask about respite care resources so that you can have a regular break from the stress of caregiving.  Look for signs of stress in yourself and in the person you are caring for. If you notice signs of stress, take steps to manage it.  Consider any safety risks and take steps to avoid them.  Organize medications in a pill box for each day of the week.  Create a plan to handle any legal or financial matters. Get legal or financial advice if needed.  Keep a calendar in a central location to remind the person of appointments or other activities. Tips for reducing the risk of injury  Keep floors clear of clutter. Remove rugs, magazine racks, and floor lamps.  Keep hallways well lit, especially at night.  Put a handrail and nonslip mat in the bathtub or   shower.  Put childproof locks on cabinets that contain dangerous items, such as medicines, alcohol, guns, toxic cleaning items, sharp tools or utensils, matches, and lighters.  Put the locks in places where the person cannot see or reach them easily. This will help ensure that the person does not wander out of the house and get lost.  Be prepared for emergencies. Keep a list of emergency phone  numbers and addresses in a convenient area.  Remove car keys and lock garage doors so that the person does not try to get in the car and drive.  Have the person wear a bracelet that tracks locations and identifies the person as having memory problems. This should be worn at all times for safety. Where to find support: Many individuals and organizations offer support. These include:  Support groups for people with dementia and for caregivers.  Counselors or therapists.  Home health care services.  Adult day care centers. Where to find more information Alzheimer's Association: www.alz.org Contact a health care provider if:  The person's health is rapidly getting worse.  You are no longer able to care for the person.  Caring for the person is affecting your physical and emotional health.  The person threatens himself or herself, you, or anyone else. Summary  Dementia is a term used to describe a number of symptoms that affect memory and thinking.  Dementia usually gets worse slowly over time.  Take steps to reduce the person's risk of injury, and to plan for future care.  Caregivers need support, relief from caregiving, and time for their own lives. This information is not intended to replace advice given to you by your health care provider. Make sure you discuss any questions you have with your health care provider. Document Released: 07/06/2016 Document Revised: 07/15/2017 Document Reviewed: 07/06/2016 Elsevier Patient Education  2020 [employer]  

## 2019-04-08 MED ORDER — BELSOMRA 10 MG PO TABS: 10.0000 mg | ORAL_TABLET | Freq: Every evening | ORAL | 0 refills | Status: DC | PRN

## 2019-04-08 NOTE — Progress Notes (Signed)
Virtual Visit via Video   This visit type was conducted due to national recommendations for restrictions regarding the COVID-19 Pandemic (e.g. social distancing) in an effort to limit this patient's exposure and mitigate transmission in our community.  Due to her co-morbid illnesses, this patient is at least at moderate risk for complications without adequate follow up.  This format is felt to be most appropriate for this patient at this time.  All issues noted in this document were discussed and addressed.  A limited physical exam was performed with this format.    This visit type was conducted due to national recommendations for restrictions regarding the COVID-19 Pandemic (e.g. social distancing) in an effort to limit this patient's exposure and mitigate transmission in our community.  Patients identity confirmed using two different identifiers.  This format is felt to be most appropriate for this patient at this time.  All issues noted in this document were discussed and addressed.  No physical exam was performed (except for noted visual exam findings with Video Visits).    Date:  04/16/2019   ID:  Connie Poole, DOB 04-Jun-1921, MRN [id number]  Patient Location:  Home, accompanied by her daughter (her caregiver)  Provider location:   Office    Chief Complaint:  F/u for dementia  History of Present Illness:    Connie Poole is a 85 y.o. female who presents via video conferencing for a telehealth visit today.    The patient does not have symptoms concerning for COVID-19 infection (fever, chills, cough, or new shortness of breath).   She presents today for virtual visit. She prefers this method of contact due to COVID-19 pandemic.   She presents today with her daughter for her visit. She is here today for f/u dementia. She has been taking donepezil without any issues. Caregiver has noticed that she is sleeping more. She is less interactive.     Past Medical History:  Diagnosis Date   . Arthritis   . CHF (congestive heart failure) (HCC)   . Dementia (HCC)   . Diabetes mellitus   . Glaucoma   . Hyperlipidemia   . Hypertension   . Sarcoidosis    Past Surgical History:  Procedure Laterality Date  . ABDOMINAL HYSTERECTOMY    . TOTAL KNEE ARTHROPLASTY       Current Meds  Medication Sig  . ACCU-CHEK COMPACT TEST DRUM test strip   . ACCU-CHEK SOFTCLIX LANCETS lancets ONE STEP  . aspirin 81 MG tablet Take 81 mg by mouth daily.  Marland Kitchen. atorvastatin (LIPITOR) 40 MG tablet TAKE 1 TABLET BY MOUTH EVERYDAY AT BEDTIME  . brimonidine (ALPHAGAN P) 0.1 % SOLN Apply 1 drop to eye every 8 (eight) hours.   . Calcium Carbonate-Vitamin D (CALCIUM 600+D PO) Take 1 tablet by mouth daily.   . carvedilol (COREG) 25 MG tablet Take 1 tablet (25 mg total) by mouth 2 (two) times daily.  . Chlorphen-Pseudoephed-APAP (CORICIDIN D PO) Take 1 tablet by mouth as needed. cough   . Cholecalciferol (D3-50 PO) Take 50 mg by mouth daily.  . clonazePAM (KLONOPIN) 0.5 MG tablet TAKE 0.5-1 TABLETS (0.25-0.5 MG TOTAL) BY MOUTH AT BEDTIME AS NEEDED.  Marland Kitchen. Cranberry-Vit C-Probiotic-Ca (GNP CRANBERRY) TABS Take by mouth 2 (two) times daily.  Marland Kitchen. donepezil (ARICEPT) 5 MG tablet TAKE 1 TABLET BY MOUTH EVERY DAY IN THE EVENING  . dorzolamide (TRUSOPT) 2 % ophthalmic solution Place 1 drop into both eyes 2 (two) times daily.   Marland Kitchen. ipratropium (ATROVENT)  0.06 % nasal spray Place 2 sprays into both nostrils every 8 (eight) hours as needed.   Marland Kitchen. KLOR-CON M20 20 MEQ tablet TAKE 1 TABLET BY MOUTH EVERY DAY WITH FOOD  . LUMIGAN 0.01 % SOLN Place 1 drop into the left eye at bedtime.   . Melatonin 10 MG TBCR Take 1 tablet by mouth at bedtime.  . Multiple Vitamins-Minerals (PRESERVISION AREDS 2 PO) Take 1 tablet by mouth daily.  . Omega-3 Fatty Acids (FISH OIL) 1000 MG CAPS Take 1,000 mg by mouth daily.  . sertraline (ZOLOFT) 50 MG tablet Increase to 1-1/2 tab po qpm  . Turmeric (CVS TURMERIC CURCUMIN) 500 MG CAPS Take 1 tablet by  mouth daily.  . [DISCONTINUED] furosemide (LASIX) 40 MG tablet TAKE 1 TABLET BY MOUTH EVERY DAY  . [DISCONTINUED] telmisartan (MICARDIS) 80 MG tablet TAKE 1 TABLET BY MOUTH EVERY DAY     Allergies:   Celebrex [celecoxib] and Vioxx [rofecoxib]   Social History   Tobacco Use  . Smoking status: Never Smoker  . Smokeless tobacco: Never Used  Substance Use Topics  . Alcohol use: No    Alcohol/week: 0.0 standard drinks  . Drug use: Not Currently     Family Hx: The patient's family history includes Alzheimer's disease in an other family member; Cancer in her brother; Coronary artery disease in an other family member; Diabetes in an other family member; Heart disease in her father; Hypertension in an other family member; Prostate cancer in her brother; Stroke in her mother; Throat cancer in her brother.  ROS:   Please see the history of present illness.    Review of Systems  Constitutional: Negative.   Respiratory: Negative.   Cardiovascular: Negative.   Gastrointestinal: Negative.   Neurological: Negative.   Psychiatric/Behavioral: The patient has insomnia.        Pt's caregiver states pt is still anxious/jumpy despite increase to 75mg  sertraline.     All other systems reviewed and are negative.   Labs/Other Tests and Data Reviewed:    Recent Labs: 01/31/2019: ALT 12; BUN 37; Creatinine, Ser 1.42; Hemoglobin 9.8; Platelets 314; Potassium 4.8; Sodium 141; TSH 1.960   Recent Lipid Panel Lab Results  Component Value Date/Time   CHOL 155 09/27/2018 03:54 PM   TRIG 100 09/27/2018 03:54 PM   HDL 36 (L) 09/27/2018 03:54 PM   CHOLHDL 4.3 09/27/2018 03:54 PM   LDLCALC 99 09/27/2018 03:54 PM    Wt Readings from Last 3 Encounters:  12/14/18 185 lb (83.9 kg)  12/22/17 194 lb (88 kg)  05/26/16 194 lb 9.6 oz (88.3 kg)     Exam:    Vital Signs:  BP (!) 121/51 Comment: pt provided  Pulse (!) 52 Comment: pt provided  Temp (!) 97.4 F (36.3 C) (Oral) Comment: pt provided  Ht 5'  (1.524 m)   BMI 36.13 kg/m     Physical Exam  Constitutional: She is well-developed, well-nourished, and in no distress.  HENT:  Head: Normocephalic and atraumatic.  Pulmonary/Chest: Effort normal.  Neurological:  Appears drowsy. Pt's caregiver stated she had to wake her up for the appointment.  Psychiatric: Affect normal.  Nursing note and vitals reviewed.   ASSESSMENT & PLAN:     1. Lewy body dementia without behavioral disturbance (HCC)  Chronic. She has upcoming Neuro appt. Although not first line, I do think she may have benefit from [place]. Patient's daughter wishes to wait for Neuro eval prior to changing meds.   2. Agitation  Likely  related to the dementia. Since there was no improvement with increase in sertraline, she is advised to go back to 50mg  dose. Her caregiver is in agreement with this treatment plan.   3. Other insomnia  Likely also related to her dementia. Pt advised that Belsomra may be helpful in this instance. I will check for samples. It is imperative for the patient to sleep well at night, so her caregivers can sleep. My hope is that this can be achieved without overmedication.   4. Decreased mobility  She has had in-home PT. Unfortunately, she does not perform exercises on her own.   5. Drug therapy     COVID-19 Education: The signs and symptoms of COVID-19 were discussed with the patient and how to seek care for testing (follow up with PCP or arrange E-visit).  The importance of social distancing was discussed today.  Patient Risk:   After full review of this patients clinical status, I feel that they are at least moderate risk at this time.      Medication Adjustments/Labs and Tests Ordered: Current medicines are reviewed at length with the patient today.  Concerns regarding medicines are outlined above.   Tests Ordered: No orders of the defined types were placed in this encounter.   Medication Changes: Meds ordered this encounter   Medications  . Suvorexant (BELSOMRA) 10 MG TABS    Sig: Take 10 mg by mouth at bedtime as needed.    Dispense:  30 tablet    Refill:  0    Disposition:  Follow up - 8 weeks.   Signed, Maximino Greenland, MD

## 2019-04-09 ENCOUNTER — Other Ambulatory Visit: Payer: Self-pay | Admitting: Nurse Practitioner

## 2019-04-09 ENCOUNTER — Other Ambulatory Visit: Payer: Self-pay | Admitting: Internal Medicine

## 2019-04-10 ENCOUNTER — Encounter: Payer: Self-pay | Admitting: Internal Medicine

## 2019-04-10 ENCOUNTER — Other Ambulatory Visit: Payer: Medicare Other | Admitting: Internal Medicine

## 2019-04-10 ENCOUNTER — Other Ambulatory Visit: Payer: Self-pay

## 2019-04-10 DIAGNOSIS — Z515 Encounter for palliative care: Secondary | ICD-10-CM

## 2019-04-10 NOTE — Progress Notes (Signed)
Aug 25th, 2020 [facility] Telephone: [phone]) [phone] Fax: [phone]   Due to the current COVID-19 infection/crises, the patient and family prefer, and have given their verbal consent for, a provider visit via telemedicine. HIPPA policies of confidentially were discussed and daughter. Video-audio (telehealth) contact was unable to be done due technical barriers from the patient's side.   PATIENT NAME: Connie Poole DOB: 21-Jan-1921 MRN: [id number]   PRIMARY CARE PROVIDER:   Dorothyann PengSanders, Robyn, MD   REFERRING PROVIDER:  Dorothyann PengSanders, Robyn, MD [address] [place], [place] [id number]  1.Dementia with behavioral disturbances (hallucinations and hollering out at night): PCP daughter Dois DavenportSandra mentions that patient has remained without significant functional change. Cognitively she is perhaps a little more confused. Dois DavenportSandra is coming to grips that these changes are d/t progression in her dementia, rather than an underlying delirium from UTI's. Notes early awakening insomnia thought d/t nocturnal hallucinations. Dois DavenportSandra is working on her PCP for possible medication management. Patient has a sweet disposition. Functionally patient is dependent for transfers, guiding assist with ambulation (walker with slow shuffling gait). She is mostly continent of bladder and bowel. She feeds herself. Weight appears stable.    2. Family supports: Dois DavenportSandra now has hired private care givers for morning care (Mon-Fri; few hours on the weekend), and is enrolled in a program that provides caregivers for a few hours Mon-Fri.    3. Follow up: daughter requests f/u NP visit q 4-6 weeks. Next scheduled telehealth visit Tues 05/29/2019 at 2:20pm  4. Advanced Care Directives: DNR and MOST form in place   I spent 30 minutes providing this consultation, from 4pm to 4:30pm. More than 50% of the time in this consultation was spent coordinating communication, interview of daughter and  patient, reconciling medication list, and charting.    HPI/INTERVAL NOTE: Sweet 85yo AA female with h/o Lewy Body dementia with behavioral disturbances, HTN, DM, and CHF. Routine Palliative Care follow up from 01/25/19 to assist with symptom management.   CODE STATUS: DNR-comfort; no antibiotic's, no IVF's no feeding tube   PPS: weak 40%   HOSPICE ELIGIBILITY/DIAGNOSIS: not at this time, as prognosis thought greater than 6 months. PAST MEDICAL HISTORY:  Past Medical History:  Diagnosis Date  . Arthritis   . CHF (congestive heart failure) (HCC)   . Dementia (HCC)   . Diabetes mellitus   . Glaucoma   . Hyperlipidemia   . Hypertension   . Sarcoidosis     SOCIAL HX:  Social History   Tobacco Use  . Smoking status: Never Smoker  . Smokeless tobacco: Never Used  Substance Use Topics  . Alcohol use: No    Alcohol/week: 0.0 standard drinks    ALLERGIES:  Allergies  Allergen Reactions  . Celebrex [Celecoxib] Nausea And Vomiting  . Vioxx [Rofecoxib] Nausea And Vomiting     PERTINENT MEDICATIONS:  Outpatient Encounter Medications as of 04/10/2019  Medication Sig  . ACCU-CHEK COMPACT TEST DRUM test strip   . ACCU-CHEK SOFTCLIX LANCETS lancets ONE STEP  . aspirin 81 MG tablet Take 81 mg by mouth daily.  Marland Kitchen. atorvastatin (LIPITOR) 40 MG tablet TAKE 1 TABLET BY MOUTH EVERYDAY AT BEDTIME  . brimonidine (ALPHAGAN P) 0.1 % SOLN Apply 1 drop to eye every 8 (eight) hours.   . Calcium Carbonate-Vitamin D (CALCIUM 600+D PO) Take 1 tablet by mouth daily.   . carvedilol (COREG) 25 MG tablet Take 1 tablet (25 mg total) by mouth 2 (two) times daily.  .Marland Kitchen  Chlorphen-Pseudoephed-APAP (CORICIDIN D PO) Take 1 tablet by mouth as needed. cough   . Cholecalciferol (D3-50 PO) Take 50 mg by mouth daily.  . clonazePAM (KLONOPIN) 0.5 MG tablet TAKE 0.5-1 TABLETS (0.25-0.5 MG TOTAL) BY MOUTH AT BEDTIME AS NEEDED.  Marland Kitchen Cranberry-Vit C-Probiotic-Ca (GNP CRANBERRY) TABS Take by mouth 2 (two) times daily.  Marland Kitchen  donepezil (ARICEPT) 5 MG tablet TAKE 1 TABLET BY MOUTH EVERY DAY IN THE EVENING  . dorzolamide (TRUSOPT) 2 % ophthalmic solution Place 1 drop into both eyes 2 (two) times daily.   . furosemide (LASIX) 40 MG tablet TAKE 1 TABLET BY MOUTH EVERY DAY  . ipratropium (ATROVENT) 0.06 % nasal spray Place 2 sprays into both nostrils every 8 (eight) hours as needed.   Marland Kitchen KLOR-CON M20 20 MEQ tablet TAKE 1 TABLET BY MOUTH EVERY DAY WITH FOOD  . LUMIGAN 0.01 % SOLN Place 1 drop into the left eye at bedtime.   . Melatonin 10 MG TBCR Take 1 tablet by mouth at bedtime.  . Multiple Vitamins-Minerals (PRESERVISION AREDS 2 PO) Take 1 tablet by mouth daily.  . Omega-3 Fatty Acids (FISH OIL) 1000 MG CAPS Take 1,000 mg by mouth daily.  . sertraline (ZOLOFT) 50 MG tablet Increase to 1-1/2 tab po qpm  . Suvorexant (BELSOMRA) 10 MG TABS Take 10 mg by mouth at bedtime as needed.  Marland Kitchen telmisartan (MICARDIS) 80 MG tablet TAKE 1 TABLET BY MOUTH EVERY DAY  . Turmeric (CVS TURMERIC CURCUMIN) 500 MG CAPS Take 1 tablet by mouth daily.   No facility-administered encounter medications on file as of 04/10/2019.     PHYSICAL EXAM:   Deferred d/t tele-phonic nature of telehealth visit.  Julianne Handler, NP

## 2019-04-11 ENCOUNTER — Telehealth: Payer: Self-pay

## 2019-04-11 NOTE — Telephone Encounter (Signed)
I returned the pt's daughter call Mrs. [place] and notified her that Dr Baird Cancer said it was ok for the pt to take [facility] and that Dr. Baird Cancer wanted the contact name at [facility] and Mrs. [place] said [place], Therapist, sports.

## 2019-04-11 NOTE — Telephone Encounter (Signed)
Do you have the fax information for [facility]? WE need to fax over some orders.

## 2019-04-12 NOTE — Telephone Encounter (Signed)
Yes I have the fax number to [facility].

## 2019-04-18 ENCOUNTER — Other Ambulatory Visit: Payer: Self-pay

## 2019-04-26 ENCOUNTER — Encounter: Payer: Self-pay | Admitting: Internal Medicine

## 2019-04-26 ENCOUNTER — Other Ambulatory Visit: Payer: Self-pay

## 2019-04-26 ENCOUNTER — Ambulatory Visit (INDEPENDENT_AMBULATORY_CARE_PROVIDER_SITE_OTHER): Payer: Medicare Other | Admitting: Internal Medicine

## 2019-04-26 VITALS — BP 122/68 | HR 63 | Temp 97.5°F

## 2019-04-26 DIAGNOSIS — N912 Amenorrhea, unspecified: Secondary | ICD-10-CM

## 2019-04-26 DIAGNOSIS — Z23 Encounter for immunization: Secondary | ICD-10-CM | POA: Diagnosis not present

## 2019-04-26 DIAGNOSIS — N183 Chronic kidney disease, stage 3 unspecified: Secondary | ICD-10-CM

## 2019-04-26 DIAGNOSIS — R062 Wheezing: Secondary | ICD-10-CM

## 2019-04-26 DIAGNOSIS — I129 Hypertensive chronic kidney disease with stage 1 through stage 4 chronic kidney disease, or unspecified chronic kidney disease: Secondary | ICD-10-CM

## 2019-04-26 DIAGNOSIS — H6123 Impacted cerumen, bilateral: Secondary | ICD-10-CM

## 2019-04-26 DIAGNOSIS — D649 Anemia, unspecified: Secondary | ICD-10-CM | POA: Diagnosis not present

## 2019-04-26 DIAGNOSIS — E1122 Type 2 diabetes mellitus with diabetic chronic kidney disease: Secondary | ICD-10-CM | POA: Diagnosis not present

## 2019-04-26 MED ORDER — HYDROCORTISONE ACETATE 25 MG RE SUPP: 25.0000 mg | Freq: Two times a day (BID) | RECTAL | 2 refills | Status: AC

## 2019-04-26 MED ORDER — HYDROCORTISONE (PERIANAL) 2.5 % EX CREA: 1.0000 "application " | TOPICAL_CREAM | Freq: Two times a day (BID) | CUTANEOUS | 2 refills | Status: AC

## 2019-04-26 NOTE — Progress Notes (Signed)
Subjective:     Patient ID: Connie MichaelsHelen B Poole , female    DOB: 1920/10/19 , 85 y.o.   MRN: [id number]   Chief Complaint  Patient presents with  . ears cleaned    HPI  She has hx of ear wax build up and needs lavage. Has not been hearing as well. Her dementia is advancing. Appetite is feel. Eats a good breakfast, not good for lunch, and OK with dinner.  Sleeps is bad and has hallucinations, and her daughter has a care giver twice a week to help out.  She also needs to have her anemia checked. Her daughter has been giving her more greens and beats, not Fe since it would constipate her more.  She also has complained of her rectal area being in pain when she has hard stool and daughter saw a little blood in stool yesterday.   Past Medical History:  Diagnosis Date  . Arthritis   . CHF (congestive heart failure) (HCC)   . Dementia (HCC)   . Diabetes mellitus   . Glaucoma   . Hyperlipidemia   . Hypertension   . Sarcoidosis      Family History  Problem Relation Age of Onset  . Stroke Mother   . Heart disease Father   . Prostate cancer Brother   . Cancer Brother   . Throat cancer Brother   . Alzheimer's disease Other   . Coronary artery disease Other   . Diabetes Other   . Hypertension Other      Current Outpatient Medications:  .  aspirin 81 MG tablet, Take 81 mg by mouth daily., Disp: , Rfl:  .  atorvastatin (LIPITOR) 40 MG tablet, TAKE 1 TABLET BY MOUTH EVERYDAY AT BEDTIME, Disp: 90 tablet, Rfl: 1 .  brimonidine (ALPHAGAN P) 0.1 % SOLN, Apply 1 drop to eye every 8 (eight) hours. , Disp: , Rfl:  .  Calcium Carbonate-Vitamin D (CALCIUM 600+D PO), Take 1 tablet by mouth daily. , Disp: , Rfl:  .  carvedilol (COREG) 25 MG tablet, Take 1 tablet (25 mg total) by mouth 2 (two) times daily., Disp: 180 tablet, Rfl: 1 .  Chlorphen-Pseudoephed-APAP (CORICIDIN D PO), Take 1 tablet by mouth as needed. cough , Disp: , Rfl:  .  Cholecalciferol (D3-50 PO), Take 50 mg by mouth daily., Disp: ,  Rfl:  .  clonazePAM (KLONOPIN) 0.5 MG tablet, TAKE 0.5-1 TABLETS (0.25-0.5 MG TOTAL) BY MOUTH AT BEDTIME AS NEEDED., Disp: 30 tablet, Rfl: 1 .  Cranberry-Vit C-Probiotic-Ca (GNP CRANBERRY) TABS, Take by mouth 2 (two) times daily., Disp: , Rfl:  .  donepezil (ARICEPT) 5 MG tablet, TAKE 1 TABLET BY MOUTH EVERY DAY IN THE EVENING, Disp: 90 tablet, Rfl: 2 .  dorzolamide (TRUSOPT) 2 % ophthalmic solution, Place 1 drop into both eyes 2 (two) times daily. , Disp: , Rfl:  .  furosemide (LASIX) 40 MG tablet, TAKE 1 TABLET BY MOUTH EVERY DAY, Disp: 90 tablet, Rfl: 1 .  ipratropium (ATROVENT) 0.06 % nasal spray, Place 2 sprays into both nostrils every 8 (eight) hours as needed. , Disp: , Rfl:  .  KLOR-CON M20 20 MEQ tablet, TAKE 1 TABLET BY MOUTH EVERY DAY WITH FOOD, Disp: 90 tablet, Rfl: 1 .  LUMIGAN 0.01 % SOLN, Place 1 drop into the left eye at bedtime. , Disp: , Rfl: 6 .  Melatonin 10 MG TBCR, Take 1 tablet by mouth at bedtime., Disp: , Rfl:  .  Multiple Vitamins-Minerals (PRESERVISION AREDS 2 PO),  Take 1 tablet by mouth daily., Disp: , Rfl:  .  Omega-3 Fatty Acids (FISH OIL) 1000 MG CAPS, Take 1,000 mg by mouth daily., Disp: , Rfl:  .  sertraline (ZOLOFT) 50 MG tablet, Increase to 1-1/2 tab po qpm, Disp: 135 tablet, Rfl: 1 .  telmisartan (MICARDIS) 80 MG tablet, TAKE 1 TABLET BY MOUTH EVERY DAY, Disp: 90 tablet, Rfl: 1 .  Turmeric (CVS TURMERIC CURCUMIN) 500 MG CAPS, Take 1 tablet by mouth daily., Disp: , Rfl:  .  ACCU-CHEK COMPACT TEST DRUM test strip, , Disp: , Rfl:  .  ACCU-CHEK SOFTCLIX LANCETS lancets, ONE STEP, Disp: , Rfl:  .  Suvorexant (BELSOMRA) 10 MG TABS, Take 10 mg by mouth at bedtime as needed. (Patient not taking: Reported on 04/26/2019), Disp: 30 tablet, Rfl: 0   Allergies  Allergen Reactions  . Celebrex [Celecoxib] Nausea And Vomiting  . Vioxx [Rofecoxib] Nausea And Vomiting     Review of Systems  No fever, has constant PND and cough from this is unchanged.  Pt comes once a week  who is helping her with transfers and strengthening.  Daughter denies diarrhea, and more on the constipated and smooth move helps her. Drinks 40-50 oz of fluids a day. Keeps constant drainage and spits clear mucous. Tends to wheeze when she lays down. No fever, chills or sweats.  Today's Vitals   04/26/19 1640  BP: 122/68  Pulse: 63  Temp: (!) 97.5 F (36.4 C)  TempSrc: Oral   There is no height or weight on file to calculate BMI.   Objective:  Physical Exam   Constitutional: She is oriented to person, place, and time. She appears well-developed and well-nourished. No distress.  HENT: unable to see TM's due to wax build up. Lavage done and able to remove the wax from both of them.  Head: Normocephalic and atraumatic.  Right Ear: External ear normal.  Left Ear: External ear normal.  Nose: Nose normal.  Eyes: Conjunctivae are normal. Right eye exhibits no discharge. Left eye exhibits no discharge. No scleral icterus.  Neck: Neck supple. No thyromegaly present.  No carotid bruits bilaterally  Cardiovascular: Normal rate and regular rhythm.  No murmur heard. Pulmonary/Chest: Effort normal and has mild diffuse expiratory wheezing.  No respiratory distress.  Musculoskeletal: Normal range of motion. She exhibits no edema.  Lymphadenopathy:    She has no cervical adenopathy.  ABD- has lower abd reducible hernia. Rectal shows small external hemorrhoid and moderate internal hemorrhoid at 6 o'clock.  Neurological: She is alert and oriented to person, place, and time.  Skin: Skin is warm and dry. Capillary refill takes less than 2 seconds. No rash noted. She is not diaphoretic.  Psychiatric: She has a normal mood and affect. Her behavior is normal. Judgment and thought content normal.  Nursing note reviewed.     Assessment And Plan:     1. Need for influenza vaccination- - Flu vaccine HIGH DOSE PF (Fluzone High dose)  2. Hypertensive nephropathy- chronic. No action - CMP14 + Anion  Gap  3. Type 2 diabetes mellitus with stage 3 chronic kidney disease, without long-term current use of insulin ([place])- chronic. May continue DM diet.  - Hemoglobin A1c  4. Amenia- chronic - CBC with Diff - Iron - Ferritin - Iron and IBC ([id number])  5. Wheezing- I ordered CBC with diff to see if this is bacterial or not.    I send [facility] neg machine order and supplies with albuterol ampule's.  FU in  3-6 months with Dr Allyne Gee.   6- Cerumen impaction both ears- lavage of both ears done and were able to remove them.  Connie Smyser RODRIGUEZ-SOUTHWORTH, PA-C    THE PATIENT IS ENCOURAGED TO PRACTICE SOCIAL DISTANCING DUE TO THE COVID-19 PANDEMIC.

## 2019-04-27 LAB — CMP14 + ANION GAP
ALT: 12 IU/L (ref 0–32)
AST: 11 IU/L (ref 0–40)
Albumin/Globulin Ratio: 1.6 (ref 1.2–2.2)
Albumin: 4.2 g/dL (ref 3.5–4.6)
Alkaline Phosphatase: 72 IU/L (ref 39–117)
Anion Gap: 13 mmol/L (ref 10.0–18.0)
BUN/Creatinine Ratio: 35 — ABNORMAL HIGH (ref 12–28)
BUN: 46 mg/dL — ABNORMAL HIGH (ref 10–36)
Bilirubin Total: <0.2 mg/dL (ref 0.0–1.2)
CO2: 24 mmol/L (ref 20–29)
Calcium: 9.7 mg/dL (ref 8.7–10.3)
Chloride: 100 mmol/L (ref 96–106)
Creatinine, Ser: 1.33 mg/dL — ABNORMAL HIGH (ref 0.57–1.00)
GFR calc Af Amer: 39 mL/min/{1.73_m2} — ABNORMAL LOW (ref >59)
GFR calc non Af Amer: 34 mL/min/{1.73_m2} — ABNORMAL LOW (ref >59)
Globulin, Total: 2.6 g/dL (ref 1.5–4.5)
Glucose: 114 mg/dL — ABNORMAL HIGH (ref 65–99)
Potassium: 5.1 mmol/L (ref 3.5–5.2)
Sodium: 137 mmol/L (ref 134–144)
Total Protein: 6.8 g/dL (ref 6.0–8.5)

## 2019-04-27 LAB — CBC WITH DIFFERENTIAL/PLATELET
Basophils Absolute: 0 10*3/uL (ref 0.0–0.2)
Basos: 1 %
EOS (ABSOLUTE): 0.4 10*3/uL (ref 0.0–0.4)
Eos: 8 %
Hematocrit: 29.4 % — ABNORMAL LOW (ref 34.0–46.6)
Hemoglobin: 9.2 g/dL — ABNORMAL LOW (ref 11.1–15.9)
Immature Grans (Abs): 0 10*3/uL (ref 0.0–0.1)
Immature Granulocytes: 0 %
Lymphocytes Absolute: 1.8 10*3/uL (ref 0.7–3.1)
Lymphs: 34 %
MCH: 27.3 pg (ref 26.6–33.0)
MCHC: 31.3 g/dL — ABNORMAL LOW (ref 31.5–35.7)
MCV: 87 fL (ref 79–97)
Monocytes Absolute: 0.7 10*3/uL (ref 0.1–0.9)
Monocytes: 13 %
Neutrophils Absolute: 2.3 10*3/uL (ref 1.4–7.0)
Neutrophils: 44 %
Platelets: 361 10*3/uL (ref 150–450)
RBC: 3.37 x10E6/uL — ABNORMAL LOW (ref 3.77–5.28)
RDW: 12.6 % (ref 11.7–15.4)
WBC: 5.2 10*3/uL (ref 3.4–10.8)

## 2019-04-27 LAB — IRON AND TIBC
Iron Saturation: 17 % (ref 15–55)
Iron: 45 ug/dL (ref 27–139)
Total Iron Binding Capacity: 266 ug/dL (ref 250–450)
UIBC: 221 ug/dL (ref 118–369)

## 2019-04-27 LAB — FERRITIN: Ferritin: 62 ng/mL (ref 15–150)

## 2019-04-27 LAB — HEMOGLOBIN A1C
Est. average glucose Bld gHb Est-mCnc: 120 mg/dL
Hgb A1c MFr Bld: 5.8 % — ABNORMAL HIGH (ref 4.8–5.6)

## 2019-04-28 DIAGNOSIS — M6281 Muscle weakness (generalized): Secondary | ICD-10-CM

## 2019-04-28 DIAGNOSIS — N183 Chronic kidney disease, stage 3 (moderate): Secondary | ICD-10-CM

## 2019-04-28 DIAGNOSIS — H548 Legal blindness, as defined in USA: Secondary | ICD-10-CM | POA: Diagnosis not present

## 2019-04-28 DIAGNOSIS — E1122 Type 2 diabetes mellitus with diabetic chronic kidney disease: Secondary | ICD-10-CM

## 2019-04-28 DIAGNOSIS — F330 Major depressive disorder, recurrent, mild: Secondary | ICD-10-CM

## 2019-04-28 DIAGNOSIS — F0390 Unspecified dementia without behavioral disturbance: Secondary | ICD-10-CM | POA: Diagnosis not present

## 2019-04-28 DIAGNOSIS — Z6841 Body Mass Index (BMI) 40.0 and over, adult: Secondary | ICD-10-CM

## 2019-04-28 DIAGNOSIS — Z9181 History of falling: Secondary | ICD-10-CM

## 2019-04-28 DIAGNOSIS — N390 Urinary tract infection, site not specified: Secondary | ICD-10-CM | POA: Diagnosis not present

## 2019-04-28 DIAGNOSIS — I129 Hypertensive chronic kidney disease with stage 1 through stage 4 chronic kidney disease, or unspecified chronic kidney disease: Secondary | ICD-10-CM

## 2019-04-28 DIAGNOSIS — R2689 Other abnormalities of gait and mobility: Secondary | ICD-10-CM | POA: Diagnosis not present

## 2019-05-03 ENCOUNTER — Telehealth: Payer: Self-pay

## 2019-05-03 ENCOUNTER — Other Ambulatory Visit: Payer: Self-pay | Admitting: Internal Medicine

## 2019-05-03 DIAGNOSIS — R053 Chronic cough: Secondary | ICD-10-CM

## 2019-05-03 DIAGNOSIS — R05 Cough: Secondary | ICD-10-CM

## 2019-05-03 DIAGNOSIS — R062 Wheezing: Secondary | ICD-10-CM

## 2019-05-03 DIAGNOSIS — R059 Cough, unspecified: Secondary | ICD-10-CM

## 2019-05-03 NOTE — Progress Notes (Unsigned)
Please call pt's daughter and tell her that Connie Poole does not accept wheezing as a diagnosis to cover the nebulizer machine. So I placed a referral for pulmonologist to see her for her chronic cough.

## 2019-05-03 NOTE — Telephone Encounter (Signed)
Spoke with daughter per Sunday Spillers  Please call pt's daughter and tell her that Connie Poole does not accept wheezing as a diagnosis to cover the nebulizer machine. So I placed a referral for pulmonologist to see her for her chronic cough.

## 2019-05-06 ENCOUNTER — Other Ambulatory Visit: Payer: Self-pay | Admitting: Neurology

## 2019-05-08 ENCOUNTER — Telehealth: Payer: Self-pay | Admitting: Adult Health

## 2019-05-14 ENCOUNTER — Telehealth: Payer: Self-pay

## 2019-05-14 ENCOUNTER — Encounter: Payer: Self-pay | Admitting: Internal Medicine

## 2019-05-14 ENCOUNTER — Telehealth (INDEPENDENT_AMBULATORY_CARE_PROVIDER_SITE_OTHER): Payer: Medicare Other | Admitting: Adult Health

## 2019-05-14 ENCOUNTER — Telehealth (INDEPENDENT_AMBULATORY_CARE_PROVIDER_SITE_OTHER): Payer: Medicare Other | Admitting: Internal Medicine

## 2019-05-14 ENCOUNTER — Other Ambulatory Visit: Payer: Self-pay

## 2019-05-14 DIAGNOSIS — G3183 Dementia with Lewy bodies: Secondary | ICD-10-CM | POA: Diagnosis not present

## 2019-05-14 DIAGNOSIS — F0280 Dementia in other diseases classified elsewhere without behavioral disturbance: Secondary | ICD-10-CM

## 2019-05-14 DIAGNOSIS — J411 Mucopurulent chronic bronchitis: Secondary | ICD-10-CM | POA: Diagnosis not present

## 2019-05-14 NOTE — Telephone Encounter (Signed)
Spoke with daughter about virtual visit today with Dr.Sanders  Daughter agreed

## 2019-05-14 NOTE — Progress Notes (Signed)
[facility] [address] [place]. [place] [address] [phone]     Virtual Visit via Telephone Note  I connected with Connie Poole on 05/14/19 at  2:00 PM EDT by telephone located remotely at [facility] and verified that I am speaking with the correct person using two identifiers who reports being located at home   Visit scheduled by RN. She discussed the limitations, risks, security and privacy concerns of performing an evaluation and management service by telephone and the availability of in person appointments. I also discussed with the patient that there may be a patient responsible charge related to this service. The patient expressed understanding and agreed to proceed. See telephone note for consent and additional scheduling information.    History of Present Illness:  Connie Poole is a 85 y.o. female who has been followed in this office for Lewy body dementia.  She was initially scheduled for a virtual visit follow up visit today time but due to COVID19, visit rescheduled for non-face-to-face telephone visit with patients consent. Unable to participate in video visit due to it not working.    [name] is a 85 year old female with a history of Lewy body dementia.  Apparently spoke with her daughter on the phone.  There is some concerns about agitation.  Her PCP recommended starting Namenda but the patient daughter wanted to discuss with [place] first.  The patient is on Klonopin at night.  Daughter reports that she has what she calls hallucinations at nights.  But these could be vivid dreams.  She remains on Aricept at bedtime.  She does feel that there has been a decline in her memory.  Due to the nature of this visit we were unable to test the memory.     Observations/Objective:  MMSE - Mini Mental State Exam 01/19/2018 07/20/2017 01/05/2017  Orientation to time 5 5 5   Orientation to Place 4 4 3   Registration 3 3 3   Attention/ Calculation 5 5  5   Recall 1 1 3   Language- name 2 objects 2 2 2   Language- repeat 1 1 0  Language- follow 3 step command 1 1 2   Language- read & follow direction 1 1 1   Write a sentence 1 1 1   Copy design 0 0 0  Total score 24 24 25     Generalized: Well developed, in no acute distress   Neurological examination  Mentation: Alert oriented to time, place, history taking. Follows all commands speech and language fluent  Assessment and Plan:  1.  Lewy body dementia  Advised that I would recommend first changing Aricept from p.m. to a.m.  This may eliminate vivid dreams.  We discussed starting Namenda.  I advised that it was not first-line in the setting of Lewy body dementia however I was amenable to giving her low-dose to see if it offers any benefit.  The daughter wants to hold off for now.  Advised if symptoms worsen or she develops new symptoms she should let know.  Follow Up Instructions:   She will follow-up in 3 to 4 months or sooner if needed.    I discussed the assessment and treatment plan with the patient.  The patient was provided an opportunity to ask questions and all were answered to their satisfaction. The patient agreed with the plan and verbalized an understanding of the instructions.   I provided 15 minutes of non-face-to-face time during this encounter.   , NP  [facility]  [address] [place], [place] [address]  Phone [phone] Fax [phone]

## 2019-05-14 NOTE — Progress Notes (Signed)
Virtual Visit via Video   This visit type was conducted due to national recommendations for restrictions regarding the COVID-19 Pandemic (e.g. social distancing) in an effort to limit this patient's exposure and mitigate transmission in our community.  Due to her co-morbid illnesses, this patient is at least at moderate risk for complications without adequate follow up.  This format is felt to be most appropriate for this patient at this time.  All issues noted in this document were discussed and addressed.  A limited physical exam was performed with this format.    This visit type was conducted due to national recommendations for restrictions regarding the COVID-19 Pandemic (e.g. social distancing) in an effort to limit this patient's exposure and mitigate transmission in our community.  Patients identity confirmed using two different identifiers.  This format is felt to be most appropriate for this patient at this time.  All issues noted in this document were discussed and addressed.  No physical exam was performed (except for noted visual exam findings with Video Visits).    Date:  05/14/2019   ID:  Connie Poole, DOB 1920/09/17, MRN [id number]  Patient Location:  Home, accompanied by her daughter who facilitated the call  Provider location:   Office    Chief Complaint:  "I have this cough"  History of Present Illness:    Connie Poole is a 85 y.o. female who presents via video conferencing for a telehealth visit today.    The patient does not have symptoms concerning for COVID-19 infection (fever, chills, cough, or new shortness of breath).   She presents today for virtual visit. She prefers this method of contact due to COVID-19 pandemic. She presents, along with her daughter, for further evaluation of a cough. She has been coughing up a lot of mucus. No fever/chills. Denies shortness of breath. This is not a new symptom for her, but seems to worsen both in spring and fall. Currently  taking Allegra for allergies.   Cough This is a recurrent problem. The current episode started more than 1 month ago. The problem has been unchanged. The cough is productive of sputum. Pertinent negatives include no shortness of breath. The symptoms are aggravated by lying down and pollens. She has tried OTC cough suppressant for the symptoms. The treatment provided moderate relief. Her past medical history is significant for environmental allergies.     Past Medical History:  Diagnosis Date  . Arthritis   . CHF (congestive heart failure) ([place])   . Dementia ([place])   . Diabetes mellitus   . Glaucoma   . Hyperlipidemia   . Hypertension   . Sarcoidosis    Past Surgical History:  Procedure Laterality Date  . ABDOMINAL HYSTERECTOMY    . TOTAL KNEE ARTHROPLASTY       Current Meds  Medication Sig  . ACCU-CHEK COMPACT TEST DRUM test strip   . ACCU-CHEK SOFTCLIX LANCETS lancets ONE STEP  . aspirin 81 MG tablet Take 81 mg by mouth daily.  Marland Kitchen atorvastatin (LIPITOR) 40 MG tablet TAKE 1 TABLET BY MOUTH EVERYDAY AT BEDTIME  . brimonidine (ALPHAGAN P) 0.1 % SOLN Apply 1 drop to eye every 8 (eight) hours.   . Calcium Carbonate-Vitamin D (CALCIUM 600+D PO) Take 1 tablet by mouth daily.   . carvedilol (COREG) 25 MG tablet Take 1 tablet (25 mg total) by mouth 2 (two) times daily.  . Chlorphen-Pseudoephed-APAP (CORICIDIN D PO) Take 1 tablet by mouth as needed. cough   . Cholecalciferol (  D3-50 PO) Take 50 mg by mouth daily.  . clonazePAM (KLONOPIN) 0.5 MG tablet TAKE 1/2 TO 1 TABLET BY MOUTH AT BEDTIME AS NEEDED  . Cranberry-Vit C-Probiotic-Ca (GNP CRANBERRY) TABS Take by mouth 2 (two) times daily.  Marland Kitchen. donepezil (ARICEPT) 5 MG tablet TAKE 1 TABLET BY MOUTH EVERY DAY IN THE EVENING  . dorzolamide (TRUSOPT) 2 % ophthalmic solution Place 1 drop into both eyes 2 (two) times daily.   . furosemide (LASIX) 40 MG tablet TAKE 1 TABLET BY MOUTH EVERY DAY  . hydrocortisone (ANUSOL-HC) 2.5 % rectal cream Place 1  application rectally 2 (two) times daily.  . hydrocortisone (ANUSOL-HC) 25 MG suppository Place 1 suppository (25 mg total) rectally 2 (two) times daily.  Marland Kitchen. ipratropium (ATROVENT) 0.06 % nasal spray Place 2 sprays into both nostrils every 8 (eight) hours as needed.   Marland Kitchen. KLOR-CON M20 20 MEQ tablet TAKE 1 TABLET BY MOUTH EVERY DAY WITH FOOD  . LUMIGAN 0.01 % SOLN Place 1 drop into the left eye at bedtime.   . Melatonin 10 MG TBCR Take 1 tablet by mouth at bedtime.  . Multiple Vitamins-Minerals (PRESERVISION AREDS 2 PO) Take 1 tablet by mouth daily.  . Omega-3 Fatty Acids (FISH OIL) 1000 MG CAPS Take 1,000 mg by mouth daily.  . sertraline (ZOLOFT) 50 MG tablet Increase to 1-1/2 tab po qpm  . telmisartan (MICARDIS) 80 MG tablet TAKE 1 TABLET BY MOUTH EVERY DAY  . Turmeric (CVS TURMERIC CURCUMIN) 500 MG CAPS Take 1 tablet by mouth daily.     Allergies:   Celebrex [celecoxib] and Vioxx [rofecoxib]   Social History   Tobacco Use  . Smoking status: Never Smoker  . Smokeless tobacco: Never Used  Substance Use Topics  . Alcohol use: No    Alcohol/week: 0.0 standard drinks  . Drug use: Not Currently     Family Hx: The patient's family history includes Alzheimer's disease in an other family member; Cancer in her brother; Coronary artery disease in an other family member; Diabetes in an other family member; Heart disease in her father; Hypertension in an other family member; Prostate cancer in her brother; Stroke in her mother; Throat cancer in her brother.  ROS:   Please see the history of present illness.    Review of Systems  Constitutional: Negative.   Respiratory: Positive for cough. Negative for shortness of breath.   Cardiovascular: Negative.   Gastrointestinal: Negative.   Neurological: Negative.   Endo/Heme/Allergies: Positive for environmental allergies.  Psychiatric/Behavioral: Negative.     All other systems reviewed and are negative.   Labs/Other Tests and Data Reviewed:     Recent Labs: 01/31/2019: TSH 1.960 04/26/2019: ALT 12; BUN 46; Creatinine, Ser 1.33; Hemoglobin 9.2; Platelets 361; Potassium 5.1; Sodium 137   Recent Lipid Panel Lab Results  Component Value Date/Time   CHOL 155 09/27/2018 03:54 PM   TRIG 100 09/27/2018 03:54 PM   HDL 36 (L) 09/27/2018 03:54 PM   CHOLHDL 4.3 09/27/2018 03:54 PM   LDLCALC 99 09/27/2018 03:54 PM    Wt Readings from Last 3 Encounters:  12/14/18 185 lb (83.9 kg)  12/22/17 194 lb (88 kg)  05/26/16 194 lb 9.6 oz (88.3 kg)     Exam:    Vital Signs:  There were no vitals taken for this visit.    Physical Exam  Constitutional: She is oriented to person, place, and time and well-developed, well-nourished, and in no distress.  HENT:  Head: Normocephalic and atraumatic.  Neck:  Normal range of motion.  Pulmonary/Chest: Effort normal.  She is able to speak in full sentences, breathing is not labored.   Neurological: She is alert and oriented to person, place, and time.  Psychiatric: Affect normal.  Nursing note and vitals reviewed.   ASSESSMENT & PLAN:     1. Mucopurulent chronic bronchitis (HCC)  The patient and her daughter are willing to start nebulizer therapy. Pt advised that I will send the order to DME company, and they should deliver the equipment and show them how to properly use the equipment. I will send albuterol rx as well. Pt advised to use every six hours as needed, but reminded that it is best to avoid using at bedtime. She will let me know if her sx persist.   2. Lewy body dementia without behavioral disturbance (HCC)  Chronic. She had Neuro virtual visit today. Aricept dosing will be changed to am as it could be contributing to her hallucinations. Pt advised I am in agreement with her treatment plan.     COVID-19 Education: The signs and symptoms of COVID-19 were discussed with the patient and how to seek care for testing (follow up with PCP or arrange E-visit).  The importance of social  distancing was discussed today.  Patient Risk:   After full review of this patients clinical status, I feel that they are at least moderate risk at this time.  Time:   Today, I have spent 30 minutes with the patient with telehealth technology discussing above diagnoses.     Medication Adjustments/Labs and Tests Ordered: Current medicines are reviewed at length with the patient today.  Concerns regarding medicines are outlined above.   Tests Ordered: No orders of the defined types were placed in this encounter.   Medication Changes: No orders of the defined types were placed in this encounter.   Disposition:  Follow up prn  Signed, Gwynneth Aliment, MD

## 2019-05-14 NOTE — Patient Instructions (Signed)
How to Use a Nebulizer, Adult    A nebulizer is a device that turns liquid medicine into a mist (vapor) that you can breathe in (inhale). You may need to use a nebulizer if you have a breathing illness, such as asthma or pneumonia.  There are different kinds of nebulizers. With some, you breathe in through a mouthpiece. With others, a mask fits over your nose and mouth.  Risks and complications  Using a nebulizer that does not fit right or is not cleaned right can lead to the following complications:   Infection.   Eye irritation.   Delivery of too much medicine or not enough medicine.   Mouth irritation.  How to prepare before using a nebulizer  Take these steps before using your nebulizer:  1. Check your medicine. Make sure it has not expired and is not damaged in any way.  2. Wash your hands with soap and water.  3. Put all of the parts of your nebulizer on a sturdy, flat surface. Make sure all of the tubing is connected.  4. Measure the liquid medicine according to instructions from your health care provider. Pour the liquid into the part of the nebulizer that holds the medicine (reservoir).  5. Attach the mouthpiece or mask.  6. Test the nebulizer by turning it on to make sure that a spray comes out. Then, turn it off.  How to use a nebulizer         1. Sit down and relax.  2. If your nebulizer has a mask, put it over your nose and mouth. It should fit somewhat snugly, with no gaps around the nose or cheeks where medicine could escape. If you use a mouthpiece, put it in your mouth. Press your lips firmly around the mouthpiece.  3. Turn on the nebulizer.  4. Breathe out (exhale).  5. Some nebulizers have a finger valve. If yours does, cover up the air hole so the air gets to the nebulizer.  6. Once the medicine begins to mist out, take slow, deep breaths. If there is a finger valve, release it at the end of your breath.  7. Continue taking slow, deep breaths until the medicine in the nebulizer is gone and no  vapor appears.  Be sure to stop the machine at any time if you start coughing or if the medicine foams or bubbles.  How to clean a nebulizer  The nebulizer and all of its parts must be kept very clean. If the nebulizer and its parts are not cleaned properly, bacteria can grow inside of them. If you inhale the bacteria, you can get sick. Follow the manufacturer's instructions for cleaning your nebulizer. For most nebulizers, you should follow these guidelines:   Clean the mouthpiece or mask and the medicine cup by:  ? Rinsing them after each use. Use sterile or distilled water.  ? Washing them 1-2 times a week using soap and warm water.   Do not wash the tubing.   After you rinse or wash them, place the parts on a clean towel and let them dry completely. After they dry, reconnect the pieces and turn the nebulizer on without any medicine in it. Doing this will blow air through the equipment to help dry it out.   Store the nebulizer in a clean and dust-free place.   Check the filter at least one time every week. Replace it if it looks dirty.  Contact a health care provider if:   You   on. Summary  A nebulizer is a device that turns liquid medicine into a mist (vapor) that you can breathe in (inhale).  Measure the liquid medicine according to instructions from your health care provider. Pour the liquid into the part of the nebulizer that holds the medicine (reservoir).  Once the medicine begins to mist out, take slow, deep breaths.  Rinse or wash the mouthpiece and the medicine cup after each use, and allow them to dry completely. This information is not intended to replace advice given to you by your health care provider. Make sure you discuss any questions you have with  your health care provider. Document Released: 07/21/2009 Document Revised: 02/14/2018 Document Reviewed: 02/07/2016 Elsevier Patient Education  2020 [employer].

## 2019-05-29 ENCOUNTER — Other Ambulatory Visit: Payer: Medicare Other | Admitting: Internal Medicine

## 2019-05-29 ENCOUNTER — Encounter: Payer: Self-pay | Admitting: Internal Medicine

## 2019-05-29 ENCOUNTER — Other Ambulatory Visit: Payer: Self-pay

## 2019-05-29 DIAGNOSIS — Z515 Encounter for palliative care: Secondary | ICD-10-CM

## 2019-05-29 DIAGNOSIS — R062 Wheezing: Secondary | ICD-10-CM

## 2019-05-29 NOTE — Progress Notes (Signed)
Oct 13th, 2020 [facility] Telephone: [phone] Fax: [phone]   Due to the current COVID-19 infection/crises, the patient and family prefer, and have given their verbal consent for, a provider visit via telemedicine. HIPPA policies of confidentially were discussed and daughter. Video-audio (telehealth) contact was unable to be done due technical barriers from the patient's side.   PATIENT NAME: Connie Poole DOB: 03/08/1921 MRN: [id number]   PRIMARY CARE PROVIDER:   Glendale Chard, MD   REFERRING PROVIDER:  Glendale Chard, [address] [place], [place] [address]  RESPONSIBLE PARTY:[name] Suzan Garibaldi (daughter) [phone]  IMPRESSION / RECOMMENDATIONS: 1. Advanced Care Directives: DNR and MOST form in place   2. Dementia with behavioral disturbances (hallucinations and hollering out at night):  Aricept dosage timing changed to qam (from qhs), in the hopes that patient's troublesome vivid dream would decrease. Daughter reports incremental improvement. We discussed discontinuation of the Aricept all together; Daughter will check with patient's neurologist. [name] reports a continued very slow cognitive and functional decline. [name] seems to have come to terms with this, and with the expected progression. Daughter plans to start prescribed Belsomra as a sleeping aid in the hopes that a good night's sleep will aid the patient in being less restless/agitated during the day.  3. Family supports: Hired private care givers for morning care (Mon-Fri; few hours on the weekend), and is enrolled in a program that provides caregivers for a few hours Mon-Fri. 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   4. Follow up: daughter requests f/u NP visit q 4-6 weeks. Next scheduled telehealth visit Tues 07/03/2019 at 2:20pm   I spent 30 minutes providing this consultation,  from 2:30pm to 3pm. More than 50% of the time in this consultation was spent coordinating communication, interview of daughter and patient, reconciling medication list, and charting.    HPI/INTERVAL NOTE: Sweet 85yo AA female with h/o Lewy Body dementia with behavioral disturbances, HTN, DM, and CHF. Routine Palliative Care follow up from 04/12/19 to assist with symptom management.   CODE STATUS: DNR-comfort; no antibiotic's, no IVF's no feeding tube   PPS: weak 40%   HOSPICE ELIGIBILITY/DIAGNOSIS: not at this time, as prognosis thought greater than 6 months.  PAST MEDICAL HISTORY:  Past Medical History:  Diagnosis Date  . Arthritis   . CHF (congestive heart failure) ([place])   . Dementia ([place])   . Diabetes mellitus   . Glaucoma   . Hyperlipidemia   . Hypertension   . Sarcoidosis     SOCIAL HX:  Social History   Tobacco Use  . Smoking status: Never Smoker  . Smokeless tobacco: Never Used  Substance Use Topics  . Alcohol use: No    Alcohol/week: 0.0 standard drinks    ALLERGIES:  Allergies  Allergen Reactions  . Celebrex [Celecoxib] Nausea And Vomiting  . Vioxx [Rofecoxib] Nausea And Vomiting     PERTINENT MEDICATIONS:  Outpatient Encounter Medications as of 05/29/2019  Medication Sig  . ACCU-CHEK COMPACT TEST DRUM test strip   . ACCU-CHEK SOFTCLIX LANCETS lancets ONE STEP  . aspirin 81 MG tablet Take 81 mg by mouth daily.  Marland Kitchen atorvastatin (LIPITOR) 40 MG tablet TAKE 1 TABLET BY MOUTH EVERYDAY AT BEDTIME  . brimonidine (ALPHAGAN P) 0.1 % SOLN Apply 1 drop to eye every 8 (eight) hours.   . Calcium Carbonate-Vitamin D (CALCIUM 600+D PO) Take 1 tablet by  mouth daily.   . carvedilol (COREG) 25 MG tablet Take 1 tablet (25 mg total) by mouth 2 (two) times daily.  . Chlorphen-Pseudoephed-APAP (CORICIDIN D PO) Take 1 tablet by mouth as needed. cough   . Cholecalciferol (D3-50 PO) Take 50 mg by mouth daily.  . clonazePAM (KLONOPIN) 0.5 MG tablet TAKE 1/2 TO 1 TABLET BY MOUTH AT  BEDTIME AS NEEDED  . Cranberry-Vit C-Probiotic-Ca (GNP CRANBERRY) TABS Take by mouth 2 (two) times daily.  Marland Kitchen donepezil (ARICEPT) 5 MG tablet TAKE 1 TABLET BY MOUTH EVERY DAY IN THE EVENING  . dorzolamide (TRUSOPT) 2 % ophthalmic solution Place 1 drop into both eyes 2 (two) times daily.   . furosemide (LASIX) 40 MG tablet TAKE 1 TABLET BY MOUTH EVERY DAY  . hydrocortisone (ANUSOL-HC) 2.5 % rectal cream Place 1 application rectally 2 (two) times daily.  . hydrocortisone (ANUSOL-HC) 25 MG suppository Place 1 suppository (25 mg total) rectally 2 (two) times daily.  Marland Kitchen ipratropium (ATROVENT) 0.06 % nasal spray Place 2 sprays into both nostrils every 8 (eight) hours as needed.   Marland Kitchen KLOR-CON M20 20 MEQ tablet TAKE 1 TABLET BY MOUTH EVERY DAY WITH FOOD  . LUMIGAN 0.01 % SOLN Place 1 drop into the left eye at bedtime.   . Melatonin 10 MG TBCR Take 1 tablet by mouth at bedtime.  . Multiple Vitamins-Minerals (PRESERVISION AREDS 2 PO) Take 1 tablet by mouth daily.  . Omega-3 Fatty Acids (FISH OIL) 1000 MG CAPS Take 1,000 mg by mouth daily.  . sertraline (ZOLOFT) 50 MG tablet Increase to 1-1/2 tab po qpm  . Suvorexant (BELSOMRA) 10 MG TABS Take 10 mg by mouth at bedtime as needed. (Patient not taking: Reported on 04/26/2019)  . telmisartan (MICARDIS) 80 MG tablet TAKE 1 TABLET BY MOUTH EVERY DAY  . Turmeric (CVS TURMERIC CURCUMIN) 500 MG CAPS Take 1 tablet by mouth daily.   No facility-administered encounter medications on file as of 05/29/2019.     PHYSICAL EXAM:   Deferred d/t tele-phonic nature of telehealth visit.  Anselm Lis, NP

## 2019-06-03 ENCOUNTER — Other Ambulatory Visit: Payer: Self-pay | Admitting: Neurology

## 2019-06-04 ENCOUNTER — Encounter: Payer: Self-pay | Admitting: Internal Medicine

## 2019-06-04 ENCOUNTER — Telehealth: Payer: Self-pay

## 2019-06-04 NOTE — Telephone Encounter (Signed)
Mailbox full  Can not leave message needs to come pick up samples and be informed that urine is norma

## 2019-06-12 ENCOUNTER — Other Ambulatory Visit: Payer: Self-pay

## 2019-06-12 ENCOUNTER — Other Ambulatory Visit: Payer: Self-pay | Admitting: Nurse Practitioner

## 2019-06-12 MED ORDER — ACCU-CHEK SOFTCLIX LANCETS MISC: Status: DC

## 2019-06-12 MED ORDER — BELSOMRA 10 MG PO TABS: 10.0000 mg | ORAL_TABLET | Freq: Every evening | ORAL | 2 refills | Status: DC | PRN

## 2019-06-18 ENCOUNTER — Telehealth: Payer: Self-pay

## 2019-06-18 NOTE — Telephone Encounter (Signed)
I left the pt a message to call the office back. 

## 2019-06-18 NOTE — Telephone Encounter (Signed)
-----   Message from Glendale Chard, MD sent at 06/17/2019  5:39 PM EST ----- How often is she trying to use the Miralax?  Dulcolax suppository would be more effective if she has not had bowel movement in several days.   RS ----- Message ----- From: Michelle Nasuti, Anon Raices Sent: 06/12/2019  11:36 AM EST To: Glendale Chard, MD  The pt's daughter Mrs. Wallington called and left a message that the pt. Has been sleeping better on the Belsomra so she will get the prescription that was sent to the pharmacy.  Mrs. Wallington also wanted to know if it's safe for the pt to take Miralax for her constipation because she got a recommendation to use it but she read on the bottle that it's not safe for people with kidney disease. I told Mrs. Wallington that I would ask Dr. Baird Cancer when she returns on Thursday.

## 2019-06-19 ENCOUNTER — Telehealth: Payer: Self-pay | Admitting: Adult Health

## 2019-06-22 ENCOUNTER — Other Ambulatory Visit: Payer: Self-pay | Admitting: Internal Medicine

## 2019-06-25 ENCOUNTER — Other Ambulatory Visit: Payer: Self-pay

## 2019-06-25 MED ORDER — DONEPEZIL HCL 5 MG PO TABS: ORAL_TABLET | ORAL | 2 refills | Status: AC

## 2019-06-26 ENCOUNTER — Other Ambulatory Visit: Payer: Self-pay | Admitting: Internal Medicine

## 2019-06-27 DIAGNOSIS — M6281 Muscle weakness (generalized): Secondary | ICD-10-CM

## 2019-06-27 DIAGNOSIS — R2689 Other abnormalities of gait and mobility: Secondary | ICD-10-CM

## 2019-06-27 DIAGNOSIS — F0390 Unspecified dementia without behavioral disturbance: Secondary | ICD-10-CM | POA: Diagnosis not present

## 2019-06-27 DIAGNOSIS — E1122 Type 2 diabetes mellitus with diabetic chronic kidney disease: Secondary | ICD-10-CM

## 2019-06-27 DIAGNOSIS — F330 Major depressive disorder, recurrent, mild: Secondary | ICD-10-CM

## 2019-06-27 DIAGNOSIS — N183 Chronic kidney disease, stage 3 unspecified: Secondary | ICD-10-CM

## 2019-06-27 DIAGNOSIS — I129 Hypertensive chronic kidney disease with stage 1 through stage 4 chronic kidney disease, or unspecified chronic kidney disease: Secondary | ICD-10-CM

## 2019-06-27 DIAGNOSIS — Z9181 History of falling: Secondary | ICD-10-CM

## 2019-06-27 DIAGNOSIS — H548 Legal blindness, as defined in USA: Secondary | ICD-10-CM

## 2019-06-27 DIAGNOSIS — Z6841 Body Mass Index (BMI) 40.0 and over, adult: Secondary | ICD-10-CM

## 2019-07-01 ENCOUNTER — Other Ambulatory Visit: Payer: Self-pay | Admitting: Internal Medicine

## 2019-07-03 ENCOUNTER — Encounter: Payer: Self-pay | Admitting: Internal Medicine

## 2019-07-03 ENCOUNTER — Other Ambulatory Visit: Payer: Medicare Other | Admitting: Internal Medicine

## 2019-07-03 ENCOUNTER — Other Ambulatory Visit: Payer: Self-pay

## 2019-07-03 DIAGNOSIS — Z515 Encounter for palliative care: Secondary | ICD-10-CM

## 2019-07-03 DIAGNOSIS — F0281 Dementia in other diseases classified elsewhere with behavioral disturbance: Secondary | ICD-10-CM

## 2019-07-03 DIAGNOSIS — G3183 Dementia with Lewy bodies: Secondary | ICD-10-CM

## 2019-07-03 NOTE — Progress Notes (Signed)
Nov 17th, 2020 [place] Telephone: [phone] Fax: [phone]   Due to the current COVID-19 infection/crises, the patient and family prefer, and have given their verbal consent for, a provider visit via telemedicine. HIPPA policies of confidentially were discussed and daughter. Video-audio (telehealth) contact was unable to be done due technical barriers from the patients side.   PATIENT NAME: Connie Poole DOB: [birth year] MRN: [id number]   PRIMARY CARE PROVIDER:   Glendale Chard, MD   REFERRING PROVIDER:  Glendale Poole, Connie Poole [address] [place], [place] [address]   RESPONSIBLE PARTY:   Connie Poole (daughter) [phone]    IMPRESSION / RECOMMENDATIONS: 1. Advanced Care Directives: DNR and MOST form in place    2. Dementia with behavioral disturbances: Daughter Connie Poole doesnt see much change in vivid dreams with change of Aricept from eve to am dosing schedule. Connie Poole reports a continued very slow cognitive and functional decline. She mentions that her mom has increasing episodes of stubbornness regarding going to bed, going to the bathroom, etc. We discussed trying to understand the reasons underlying the the behaviors in order to address the cause, but admittedly this can be a challenge in setting of patients dementia. Also discussed giving patient simple choices and using the term how about we do instead of commands, as patient mentions she is an adult is not going to be bossed around. Daughter trialed Belsomra 10mg  qhs as a sleeping aid; has used intermittently over the last 2 week with not much improvement. She was hoping if her mom slept better at night, she would be less agitated daytime. Connie Poole has tried melatonin 10mg  at hs without any impact.  3. Family supports: Continues with hired private care givers for morning care (Mon-Fri; few hours on the weekend), and is enrolled in a program that provides caregivers  for a few hours Mon-Fri. Connie Poole is trying to carve out some time for herself away from the house, even if just for shopping trips / household chores   4. Follow up: daughter requests f/u NP visit q 4-6 weeks. Next scheduled telehealth visit Tuesday Jan 12th at 3pm  I spent 30 minutes providing this consultation, from 2:30pm to 3pm. More than 50% of the time in this consultation was spent coordinating communication, interview of daughter and patient, reconciling medication list, and charting.    HPI/INTERVAL NOTE: Sweet 85yo AA female with h/o Lewy Body dementia with behavioral disturbances, HTN, DM, and CHF. Routine Palliative Care follow up from 05/29/19 to assist with symptom management.   CODE STATUS: DNR-comfort; no antibiotic's, no IVF's no feeding tube   PPS: weak 40%   HOSPICE ELIGIBILITY/DIAGNOSIS: not at this time, as prognosis thought greater than 6 months.  PAST MEDICAL HISTORY:  Past Medical History:  Diagnosis Date   Arthritis    CHF (congestive heart failure) (HCC)    Dementia (HCC)    Diabetes mellitus    Glaucoma    Hyperlipidemia    Hypertension    Sarcoidosis     SOCIAL HX:  Social History   Tobacco Use   Smoking status: Never Smoker   Smokeless tobacco: Never Used  Substance Use Topics   Alcohol use: No    Alcohol/week: 0.0 standard drinks    ALLERGIES:  Allergies  Allergen Reactions   Celebrex [Celecoxib] Nausea And Vomiting   Vioxx [Rofecoxib] Nausea And Vomiting     PERTINENT MEDICATIONS:  Outpatient Encounter Medications as of 07/03/2019  Medication Sig   Accu-Chek Softclix  Lancets lancets ONE STEP   aspirin 81 MG tablet Take 81 mg by mouth daily.   atorvastatin (LIPITOR) 40 MG tablet TAKE 1 TABLET BY MOUTH EVERYDAY AT BEDTIME   brimonidine (ALPHAGAN P) 0.1 % SOLN Apply 1 drop to eye every 8 (eight) hours.    Calcium Carbonate-Vitamin D (CALCIUM 600+D PO) Take 1 tablet by mouth daily.    carvedilol (COREG) 25 MG tablet TAKE  1 TABLET BY MOUTH TWICE A DAY   Chlorphen-Pseudoephed-APAP (CORICIDIN D PO) Take 1 tablet by mouth as needed. cough    Cholecalciferol (D3-50 PO) Take 50 mg by mouth daily.   clonazePAM (KLONOPIN) 0.5 MG tablet TAKE 1/2 TO 1 TABLET BY MOUTH AT BEDTIME AS NEEDED   Cranberry-Vit C-Probiotic-Ca (GNP CRANBERRY) TABS Take by mouth 2 (two) times daily.   donepezil (ARICEPT) 5 MG tablet TAKE 1 TABLET BY MOUTH EVERY DAY IN THE EVENING   dorzolamide (TRUSOPT) 2 % ophthalmic solution Place 1 drop into both eyes 2 (two) times daily.    furosemide (LASIX) 40 MG tablet TAKE 1 TABLET BY MOUTH EVERY DAY   Glucose Blood (ACCU-CHEK COMPACT TEST DRUM VI) by In Vitro route.   hydrocortisone (ANUSOL-HC) 2.5 % rectal cream Place 1 application rectally 2 (two) times daily.   hydrocortisone (ANUSOL-HC) 25 MG suppository Place 1 suppository (25 mg total) rectally 2 (two) times daily.   ipratropium (ATROVENT) 0.06 % nasal spray Place 2 sprays into both nostrils every 8 (eight) hours as needed.    KLOR-CON M20 20 MEQ tablet TAKE 1 TABLET BY MOUTH EVERY DAY WITH FOOD   LUMIGAN 0.01 % SOLN Place 1 drop into the left eye at bedtime.    Melatonin 10 MG TBCR Take 1 tablet by mouth at bedtime.   Multiple Vitamins-Minerals (PRESERVISION AREDS 2 PO) Take 1 tablet by mouth daily.   Omega-3 Fatty Acids (FISH OIL) 1000 MG CAPS Take 1,000 mg by mouth daily.   sertraline (ZOLOFT) 50 MG tablet TAKE 1 TABLET BY MOUTH EVERY DAY   Suvorexant (BELSOMRA) 10 MG TABS Take 10 mg by mouth at bedtime as needed.   telmisartan (MICARDIS) 80 MG tablet TAKE 1 TABLET BY MOUTH EVERY DAY   Turmeric ([employer] TURMERIC CURCUMIN) 500 MG CAPS Take 1 tablet by mouth daily.   No facility-administered encounter medications on file as of 07/03/2019.     PHYSICAL EXAM:   Deferred d/t tele-phonic nature of telehealth visit.  Anselm Lis, NP

## 2019-07-23 ENCOUNTER — Telehealth: Payer: Self-pay | Admitting: Adult Health

## 2019-07-23 NOTE — Telephone Encounter (Signed)
Patient daughter called to switch apt to a virtual apt due to COVID concerns. Please follow up

## 2019-07-23 NOTE — Telephone Encounter (Signed)
Noted  

## 2019-07-26 ENCOUNTER — Ambulatory Visit: Payer: Medicare Other | Admitting: Adult Health

## 2019-08-06 ENCOUNTER — Other Ambulatory Visit: Payer: Self-pay | Admitting: Internal Medicine

## 2019-08-06 ENCOUNTER — Encounter: Payer: Self-pay | Admitting: Internal Medicine

## 2019-08-06 ENCOUNTER — Ambulatory Visit (INDEPENDENT_AMBULATORY_CARE_PROVIDER_SITE_OTHER): Payer: Medicare Other | Admitting: Internal Medicine

## 2019-08-06 ENCOUNTER — Other Ambulatory Visit: Payer: Self-pay

## 2019-08-06 VITALS — BP 130/56 | HR 52 | Temp 97.5°F | Ht 60.0 in

## 2019-08-06 DIAGNOSIS — I129 Hypertensive chronic kidney disease with stage 1 through stage 4 chronic kidney disease, or unspecified chronic kidney disease: Secondary | ICD-10-CM | POA: Diagnosis not present

## 2019-08-06 DIAGNOSIS — G3183 Dementia with Lewy bodies: Secondary | ICD-10-CM | POA: Diagnosis not present

## 2019-08-06 DIAGNOSIS — N183 Chronic kidney disease, stage 3 unspecified: Secondary | ICD-10-CM | POA: Diagnosis not present

## 2019-08-06 DIAGNOSIS — E1122 Type 2 diabetes mellitus with diabetic chronic kidney disease: Secondary | ICD-10-CM

## 2019-08-06 DIAGNOSIS — G4701 Insomnia due to medical condition: Secondary | ICD-10-CM

## 2019-08-06 DIAGNOSIS — F0280 Dementia in other diseases classified elsewhere without behavioral disturbance: Secondary | ICD-10-CM

## 2019-08-06 NOTE — Progress Notes (Signed)
Virtual Visit via Video   This visit type was conducted due to national recommendations for restrictions regarding the COVID-19 Pandemic (e.g. social distancing) in an effort to limit this patient's exposure and mitigate transmission in our community.  Due to her co-morbid illnesses, this patient is at least at moderate risk for complications without adequate follow up.  This format is felt to be most appropriate for this patient at this time.  All issues noted in this document were discussed and addressed.  A limited physical exam was performed with this format.    This visit type was conducted due to national recommendations for restrictions regarding the COVID-19 Pandemic (e.g. social distancing) in an effort to limit this patient's exposure and mitigate transmission in our community.  Patients identity confirmed using two different identifiers.  This format is felt to be most appropriate for this patient at this time.  All issues noted in this document were discussed and addressed.  No physical exam was performed (except for noted visual exam findings with Video Visits).    Date:  08/12/2019   ID:  Connie Poole, DOB April 28, 1921, MRN [id number]  Patient Location:  Home, accompanied by her daughter  Provider location:   Office    Chief Complaint:  "I need DM f/u".   History of Present Illness:    Connie Poole is a 85 y.o. female who presents via video conferencing for a telehealth visit today.    The patient does not have symptoms concerning for COVID-19 infection (fever, chills, cough, or new shortness of breath).   She presents today for virtual visit. Her daughter  prefers this method of contact due to COVID-19 pandemic.  The patient does not leave her home. She does have nursing staff to come see her from time to time. She is also under the care of Palliative Care. Her daughter is present as well to facilitate the virtual visit.     Past Medical History:  Diagnosis Date  .  Arthritis   . CHF (congestive heart failure) (HCC)   . Dementia (HCC)   . Diabetes mellitus   . Glaucoma   . Hyperlipidemia   . Hypertension   . Sarcoidosis    Past Surgical History:  Procedure Laterality Date  . ABDOMINAL HYSTERECTOMY    . TOTAL KNEE ARTHROPLASTY       No outpatient medications have been marked as taking for the 08/06/19 encounter (Office Visit) with Dorothyann Peng, MD.     Allergies:   Celebrex [celecoxib] and Vioxx [rofecoxib]   Social History   Tobacco Use  . Smoking status: Never Smoker  . Smokeless tobacco: Never Used  Substance Use Topics  . Alcohol use: No    Alcohol/week: 0.0 standard drinks  . Drug use: Not Currently     Family Hx: The patient's family history includes Alzheimer's disease in an other family member; Cancer in her brother; Coronary artery disease in an other family member; Diabetes in an other family member; Heart disease in her father; Hypertension in an other family member; Prostate cancer in her brother; Stroke in her mother; Throat cancer in her brother.  ROS:   Please see the history of present illness.    Review of Systems  Constitutional: Negative.   Respiratory: Negative.   Cardiovascular: Negative.   Gastrointestinal: Negative.   Neurological: Negative.   Psychiatric/Behavioral: The patient has insomnia.     All other systems reviewed and are negative.   Labs/Other Tests and Data Reviewed:  Recent Labs: 01/31/2019: TSH 1.960 04/26/2019: ALT 12; BUN 46; Creatinine, Ser 1.33; Hemoglobin 9.2; Platelets 361; Potassium 5.1; Sodium 137   Recent Lipid Panel Lab Results  Component Value Date/Time   CHOL 155 09/27/2018 03:54 PM   TRIG 100 09/27/2018 03:54 PM   HDL 36 (L) 09/27/2018 03:54 PM   CHOLHDL 4.3 09/27/2018 03:54 PM   LDLCALC 99 09/27/2018 03:54 PM    Wt Readings from Last 3 Encounters:  12/14/18 185 lb (83.9 kg)  12/22/17 194 lb (88 kg)  05/26/16 194 lb 9.6 oz (88.3 kg)     Exam:    Vital Signs:   BP (!) 130/56 Comment: pt provided  Pulse (!) 52 Comment: pt provided  Temp (!) 97.5 F (36.4 C) Comment: pt provided  Ht 5' (1.524 m)   BMI 36.13 kg/m     Physical Exam  Constitutional: She is oriented to person, place, and time and well-developed, well-nourished, and in no distress.  HENT:  Head: Normocephalic and atraumatic.  Pulmonary/Chest: Effort normal.  She is able to speak in full sentences w/o SOB.   Musculoskeletal:     Cervical back: Normal range of motion.  Neurological: She is alert and oriented to person, place, and time.  Psychiatric: Mood and affect normal.  Nursing note and vitals reviewed.   ASSESSMENT & PLAN:     1. Type 2 diabetes mellitus with stage 3 chronic kidney disease, unspecified whether long term insulin use, unspecified whether stage 3a or 3b CKD (HCC)  Chronic. She is now off of medications. Last hba1c in Sept 2020 was 5.8. This is excellent control. She will rto in three to four months for re-evaluation. I will check another hba1c at that time.   2. Hypertensive nephropathy  Chronic, controlled. She will continue with current meds. She is encouraged to follow a low-sodium diet.   3. Lewy body dementia without behavioral disturbance (HCC)  Chronic. Her condition appears to be progression. She has Neuro f/u in the near future. Unfortunately, this had to be rescheduled.   4. Insomnia due to medical condition  Chronic. There has been some improvement with use of Belsomra. Pt and her daughter are encouraged to ask Neuro for other treatment options.     COVID-19 Education: The signs and symptoms of COVID-19 were discussed with the patient and how to seek care for testing (follow up with PCP or arrange E-visit).  The importance of social distancing was discussed today.  Patient Risk:   After full review of this patients clinical status, I feel that they are at least moderate risk at this time.     Medication Adjustments/Labs and Tests  Ordered: Current medicines are reviewed at length with the patient today.  Concerns regarding medicines are outlined above.   Tests Ordered: No orders of the defined types were placed in this encounter.   Medication Changes: No orders of the defined types were placed in this encounter.   Disposition:  Follow up in 3 month(s)  Signed, Maximino Greenland, MD

## 2019-08-07 ENCOUNTER — Encounter: Payer: Self-pay | Admitting: Internal Medicine

## 2019-08-08 ENCOUNTER — Telehealth: Payer: Medicare Other | Admitting: Adult Health

## 2019-08-12 NOTE — Patient Instructions (Signed)

## 2019-08-26 DIAGNOSIS — R2689 Other abnormalities of gait and mobility: Secondary | ICD-10-CM

## 2019-08-26 DIAGNOSIS — Z6841 Body Mass Index (BMI) 40.0 and over, adult: Secondary | ICD-10-CM

## 2019-08-26 DIAGNOSIS — F330 Major depressive disorder, recurrent, mild: Secondary | ICD-10-CM

## 2019-08-26 DIAGNOSIS — I129 Hypertensive chronic kidney disease with stage 1 through stage 4 chronic kidney disease, or unspecified chronic kidney disease: Secondary | ICD-10-CM

## 2019-08-26 DIAGNOSIS — M6281 Muscle weakness (generalized): Secondary | ICD-10-CM

## 2019-08-26 DIAGNOSIS — F0390 Unspecified dementia without behavioral disturbance: Secondary | ICD-10-CM

## 2019-08-26 DIAGNOSIS — H548 Legal blindness, as defined in USA: Secondary | ICD-10-CM | POA: Diagnosis not present

## 2019-08-26 DIAGNOSIS — E1122 Type 2 diabetes mellitus with diabetic chronic kidney disease: Secondary | ICD-10-CM

## 2019-08-26 DIAGNOSIS — Z9181 History of falling: Secondary | ICD-10-CM

## 2019-08-28 ENCOUNTER — Telehealth: Payer: Self-pay | Admitting: Internal Medicine

## 2019-08-28 NOTE — Telephone Encounter (Signed)
2pm:  TC for scheduled f/u Palliative Care visit.  No answer. I left voice mail with daughter Reynold Bowen, with my contact number for call back, to reschedule.  Holly Bodily NP-C [phone]

## 2019-09-09 ENCOUNTER — Ambulatory Visit: Payer: Medicare PPO | Attending: Internal Medicine

## 2019-09-09 DIAGNOSIS — Z23 Encounter for immunization: Secondary | ICD-10-CM

## 2019-09-10 ENCOUNTER — Telehealth: Payer: Self-pay | Admitting: Internal Medicine

## 2019-09-10 NOTE — Telephone Encounter (Signed)
I called the pt's daughter to reschedule the 10/16/19 AWV.  There was no answer, and the voicemail was full.

## 2019-09-10 NOTE — Progress Notes (Signed)
   Covid-19 Vaccination Clinic  Name:  Connie Poole    MRN: [id number] DOB: 13-Oct-1920  09/09/2019  Connie Poole was observed post Covid-19 immunization for 15 minutes without incidence. She was provided with Vaccine Information Sheet and instruction to access the V-Safe system.   Connie Poole was instructed to call 911 with any severe reactions post vaccine: Marland Kitchen Difficulty breathing  . Swelling of your face and throat  . A fast heartbeat  . A bad rash all over your body  . Dizziness and weakness    Immunizations Administered    Name Date Dose VIS Date Route   Moderna COVID-19 Vaccine 09/09/2019  4:15 PM 0.5 mL 07/17/2019 Intramuscular   Manufacturer: Gala Murdoch   Lot: [id number]   NDC: 16967-893-81      Documented on behalf of: C. Jimmey Ralph

## 2019-09-12 ENCOUNTER — Telehealth: Payer: Self-pay

## 2019-09-12 NOTE — Telephone Encounter (Signed)
The pt;s [name] left a message wanting to know what she could do for the pt because the pt has been having a lot of phlem in her throat.  Dois Davenport was told that Dr.sAnders said to cut back on the pt's dairy intake.

## 2019-09-13 ENCOUNTER — Ambulatory Visit: Payer: Medicare Other

## 2019-09-25 ENCOUNTER — Telehealth: Payer: Self-pay | Admitting: Internal Medicine

## 2019-09-25 NOTE — Telephone Encounter (Signed)
I left a message asking the pt's daughter to call and reschedule 10/16/2019 AWV & OV to either 3/3 or 3/4.

## 2019-10-04 ENCOUNTER — Ambulatory Visit: Payer: Medicare Other | Admitting: Internal Medicine

## 2019-10-04 ENCOUNTER — Ambulatory Visit: Payer: Medicare Other

## 2019-10-07 ENCOUNTER — Ambulatory Visit: Payer: Medicare PPO | Attending: Internal Medicine

## 2019-10-07 ENCOUNTER — Other Ambulatory Visit: Payer: Self-pay

## 2019-10-07 DIAGNOSIS — Z23 Encounter for immunization: Secondary | ICD-10-CM | POA: Insufficient documentation

## 2019-10-07 NOTE — Progress Notes (Signed)
   Covid-19 Vaccination Clinic  Name:  EVALYNNE LOCURTO    MRN: [id number] DOB: Apr 17, 1921  10/07/2019  Ms. Woolford was observed post Covid-19 immunization for 15 minutes without incidence. She was provided with Vaccine Information Sheet and instruction to access the V-Safe system.   Ms. Garabedian was instructed to call 911 with any severe reactions post vaccine: Marland Kitchen Difficulty breathing  . Swelling of your face and throat  . A fast heartbeat  . A bad rash all over your body  . Dizziness and weakness    Immunizations Administered    Name Date Dose VIS Date Route   Moderna COVID-19 Vaccine 10/07/2019  3:57 PM 0.5 mL 07/17/2019 Intramuscular   Manufacturer: Moderna   Lot: [id number]   NDC: 48403-979-53

## 2019-10-16 ENCOUNTER — Ambulatory Visit: Payer: Medicare Other | Admitting: Internal Medicine

## 2019-10-17 ENCOUNTER — Ambulatory Visit: Payer: Medicare PPO | Admitting: Internal Medicine

## 2019-10-17 ENCOUNTER — Ambulatory Visit: Payer: Medicare PPO

## 2019-10-24 ENCOUNTER — Telehealth: Payer: Self-pay

## 2019-10-24 NOTE — Telephone Encounter (Signed)
I called to let the pt's [name] know that Dr.Sanders placed an order to [facility] for the pt to have a urine culture done to check for a UTI because Cindee Lame the physical therapist with [facility] called and said that the pt has been more agitated, has increased confusion, and doesn't want to work with anyone.  I called the physical therapist back to inform him also because I was unable to leave a message on Mrs. Wallington's voicemail. Cindee Lame said that Mrs Lahoma Rocker didn't answer the phone because they were in the middle of doing physical therapy, that he would let Mrs. Wallington know and that he really thinks that it's time for Hospice.

## 2019-10-25 ENCOUNTER — Telehealth: Payer: Self-pay

## 2019-10-25 NOTE — Telephone Encounter (Addendum)
The patient's [name] was asked if she has contacted Palliative care about the changes with the patient and she said yes and that they are coming out for a visit with the patient next Friday.  This note is not being shared with the patient for the following reason:

## 2019-10-25 NOTE — Telephone Encounter (Addendum)
This note is not being shared with the patient for the following reason because it's a phone note.

## 2019-10-25 NOTE — Telephone Encounter (Deleted)
Dr. Allyne Gee wanted to know if you've contacted Palliative care about the changes with your mother?

## 2019-10-25 NOTE — Telephone Encounter (Signed)
The patient's [name] was asked if she has contacted Palliative care about the changes with the patient and she said yes and that they are coming out for a visit with the patient next Friday.

## 2019-10-25 NOTE — Telephone Encounter (Signed)
-----   Message from Dorothyann Peng, MD sent at 10/24/2019  6:27 PM EST ----- Please call Rosette Reveal - has she notified Palliative care of the changes?  ----- Message ----- From: Mariam Dollar, CMA Sent: 10/24/2019   4:30 PM EST To: Dorothyann Peng, MD  [facility] Physical therapist with [facility] called and said that the patient has declined a lot in the last week, that the pt is more agitated, has increased confusion, doesn't want to work with anyone.  He wanted to make Dr. Allyne Gee aware of the changes that is going on with the patient because of the decline that the pt has had in the last week.

## 2019-10-25 NOTE — Telephone Encounter (Deleted)
This note is not being shared with the patient for the following reason:   

## 2019-10-29 ENCOUNTER — Other Ambulatory Visit: Payer: Self-pay

## 2019-10-29 DIAGNOSIS — F0390 Unspecified dementia without behavioral disturbance: Secondary | ICD-10-CM | POA: Diagnosis not present

## 2019-10-29 DIAGNOSIS — H548 Legal blindness, as defined in USA: Secondary | ICD-10-CM | POA: Diagnosis not present

## 2019-11-01 ENCOUNTER — Telehealth: Payer: Self-pay

## 2019-11-01 NOTE — Telephone Encounter (Signed)
Spoke with daughter ([place]) no wipe was used when getting urine sample they just wiped her with a bath cloth. Family notified that many bacteria was noted.

## 2019-11-01 NOTE — Telephone Encounter (Signed)
Left vm for daughter to call back. Need to know if wipe was used when urine sample obtained and to inform her that many bacteria were found in urine

## 2019-11-02 ENCOUNTER — Encounter: Payer: Self-pay | Admitting: Internal Medicine

## 2019-11-02 ENCOUNTER — Other Ambulatory Visit: Payer: Medicare PPO | Admitting: Internal Medicine

## 2019-11-02 ENCOUNTER — Other Ambulatory Visit: Payer: Self-pay

## 2019-11-02 DIAGNOSIS — Z7189 Other specified counseling: Secondary | ICD-10-CM

## 2019-11-02 DIAGNOSIS — Z515 Encounter for palliative care: Secondary | ICD-10-CM

## 2019-11-04 ENCOUNTER — Encounter: Payer: Self-pay | Admitting: Internal Medicine

## 2019-11-04 NOTE — Progress Notes (Signed)
March 19th, 2020 [facility] Telephone: [phone] Fax: [phone]     PATIENT NAME: Connie Poole DOB: 05/12/1921 MRN: [id number] [address]  [place] [address] Pharmacy: [facility].   PRIMARY CARE PROVIDER:   Dorothyann Peng, MD   REFERRING PROVIDER:  Dorothyann Peng, MD [address] [place], [place] [address]   RESPONSIBLE PARTY:   Rosette Reveal (daughter) [phone]    IMPRESSION / RECOMMENDATIONS: 1. Advanced Care Directives: DNR and MOST form in place   2. Dementia with behavioral disturbances: Patient continues a very slow cognitive and functional decline. Constant constantly confused, oriented to person. Usually forgets who her daughter is. Better long-term memory, poor short. She can communicate in short appropriate sentences. Occasional non-bothersome hallucinations (seeing persons/children). She has poor vision so may be misinterpreting stimuli. Seems decrease in vivid dreams (someone lying on top of her), perhaps d/t help of evening dose of Aricept. Some improvement in initial ability to get to sleep with the Belsomra. Paradoxical response to use of Melatonin in the past. We discussed that sometimes a smaller dose of Melatonin works better than the 10 mg she was trialing. When patient first awakens she is more confused, but this seems to clear. Pattern of hypersomnolence during the daytime. Initially can get to sleep at night for the first 3 hours, then mostly awake/napping. She has a bothersome, early morning cough that may be contributing to keeping her awake. Her cough is loose and she swallows her phlegm. Mucinex without much effect per daughter.PT has taught daughter how to do chest percussions. Patient with adequate oral intake. Daughter feels she has maintained her weight at about 190lbs, though unable to logistically obtain her weight.  -trial use of Tessalon pearls prn nocturnal cough.  100mg  cap tid prn; e-prescribed.   Physical therapy continues to follow, and patient waxes and wanes in her ability to work with them. She needs assist with transfers. She is currently able to ambulate about the rooms in a loop with walker and 1 person guiding assist. A few weeks earlier patient had an episode of decreased responsiveness and weakness.   3. Family supports: Continues with hired private care givers for morning care 3 hours/day Mon, [place], and Sat, 2 hours/day otherwise. She has hired a nighttime caregiver 3 nights a week so that daughter can occasionally get a good night's sleep. Daughter grieves when patient is verbally abrasive to her daughter ("she will treat me like I'm the villain!"). Daughter realizes this is the brain disease talking, but it still hurts. We discussed reassuring and commiserating with patient and her complaints, rather than correcting and arguing.  Daughter is also caring for her elderly mother-in-law in the home. We talked about daughter and husband taking a short vacation. Dtr is reluctant to leave her mom is the care of others, but I encouraged her to do so; that her mom would be fine without the daughter's personal care, for that short of an interval.    4. Follow up: daughter will call me on a prn basis if she sees further signs of patient decline or symptom management needs. We discussed hospice; daughter feels she is not ready for that step; wishes to pursue physical therapy for patient, at this time.    I spent 60 minutes providing this consultation, from 2pm to 3pm. More than 50% of the time in this consultation was spent coordinating communication, interview of daughter and patient, reconciling medication list, and charting.  HPI/INTERVAL NOTE: Sweet 86yo AA female with h/o Lewy Body dementia with behavioral disturbances, HTN, DM, and CHF. Routine Palliative Care follow up from 06/23/2019 to assist with symptom management.   CODE STATUS: DNR-comfort; no  antibiotic's, no IVF's no feeding tube   PPS: weak 40%   HOSPICE ELIGIBILITY/DIAGNOSIS: not at this time, as prognosis thought greater than 6 months.  PAST MEDICAL HISTORY:  Past Medical History:  Diagnosis Date  . Arthritis   . CHF (congestive heart failure) (HCC)   . Dementia (HCC)   . Diabetes mellitus   . Glaucoma   . Hyperlipidemia   . Hypertension   . Sarcoidosis     SOCIAL HX:  Social History   Tobacco Use  . Smoking status: Never Smoker  . Smokeless tobacco: Never Used  Substance Use Topics  . Alcohol use: No    Alcohol/week: 0.0 standard drinks    ALLERGIES:  Allergies  Allergen Reactions  . Celebrex [Celecoxib] Nausea And Vomiting  . Vioxx [Rofecoxib] Nausea And Vomiting     PERTINENT MEDICATIONS:  Outpatient Encounter Medications as of 11/02/2019  Medication Sig  . Accu-Chek Softclix Lancets lancets ONE STEP  . aspirin 81 MG tablet Take 81 mg by mouth daily.  Marland Kitchen atorvastatin (LIPITOR) 40 MG tablet TAKE 1 TABLET BY MOUTH EVERYDAY AT BEDTIME  . brimonidine (ALPHAGAN P) 0.1 % SOLN Apply 1 drop to eye every 8 (eight) hours.   . Calcium Carbonate-Vitamin D (CALCIUM 600+D PO) Take 1 tablet by mouth daily.   . carvedilol (COREG) 25 MG tablet TAKE 1 TABLET BY MOUTH TWICE A DAY  . Chlorphen-Pseudoephed-APAP (CORICIDIN D PO) Take 1 tablet by mouth as needed. cough   . Cholecalciferol (D3-50 PO) Take 50 mg by mouth daily.  . clonazePAM (KLONOPIN) 0.5 MG tablet TAKE 1/2 TO 1 TABLET BY MOUTH AT BEDTIME AS NEEDED  . Cranberry-Vit C-Probiotic-Ca (GNP CRANBERRY) TABS Take by mouth 2 (two) times daily.  Marland Kitchen donepezil (ARICEPT) 5 MG tablet TAKE 1 TABLET BY MOUTH EVERY DAY IN THE EVENING  . dorzolamide (TRUSOPT) 2 % ophthalmic solution Place 1 drop into both eyes 2 (two) times daily.   . furosemide (LASIX) 40 MG tablet TAKE 1 TABLET BY MOUTH EVERY DAY  . Glucose Blood (ACCU-CHEK COMPACT TEST DRUM VI) by In Vitro route.  . hydrocortisone (ANUSOL-HC) 2.5 % rectal cream Place 1  application rectally 2 (two) times daily.  . hydrocortisone (ANUSOL-HC) 25 MG suppository Place 1 suppository (25 mg total) rectally 2 (two) times daily.  Marland Kitchen ipratropium (ATROVENT) 0.06 % nasal spray Place 2 sprays into both nostrils every 8 (eight) hours as needed.   Marland Kitchen KLOR-CON M20 20 MEQ tablet TAKE 1 TABLET BY MOUTH EVERY DAY WITH FOOD  . LUMIGAN 0.01 % SOLN Place 1 drop into the left eye at bedtime.   . Melatonin 10 MG TBCR Take 1 tablet by mouth at bedtime.  . Multiple Vitamins-Minerals (PRESERVISION AREDS 2 PO) Take 1 tablet by mouth daily.  . Omega-3 Fatty Acids (FISH OIL) 1000 MG CAPS Take 1,000 mg by mouth daily.  . sertraline (ZOLOFT) 50 MG tablet TAKE 1 TABLET BY MOUTH EVERY DAY  . Suvorexant (BELSOMRA) 10 MG TABS Take 10 mg by mouth at bedtime as needed.  Marland Kitchen telmisartan (MICARDIS) 80 MG tablet TAKE 1 TABLET BY MOUTH EVERY DAY  . Turmeric ([facility] TURMERIC CURCUMIN) 500 MG CAPS Take 1 tablet by mouth daily.   No facility-administered encounter medications on file as of 11/02/2019.    PHYSICAL  EXAM:   Well nourished, elderly female sitting slumped over in wheelchair fast asleep.  Daughter did not wish me to disturb her for exam. RR 16. No undue work of breathing. She had bilateral LE puffiness.   Julianne Handler, NP

## 2019-11-28 ENCOUNTER — Ambulatory Visit: Payer: Medicare PPO

## 2019-11-28 ENCOUNTER — Ambulatory Visit (INDEPENDENT_AMBULATORY_CARE_PROVIDER_SITE_OTHER): Payer: Medicare PPO | Admitting: Internal Medicine

## 2019-11-28 ENCOUNTER — Encounter: Payer: Self-pay | Admitting: Internal Medicine

## 2019-11-28 ENCOUNTER — Other Ambulatory Visit: Payer: Self-pay

## 2019-11-28 VITALS — BP 130/88 | HR 51 | Temp 98.3°F | Ht 60.0 in | Wt 167.8 lb

## 2019-11-28 DIAGNOSIS — E1122 Type 2 diabetes mellitus with diabetic chronic kidney disease: Secondary | ICD-10-CM | POA: Diagnosis not present

## 2019-11-28 DIAGNOSIS — R300 Dysuria: Secondary | ICD-10-CM

## 2019-11-28 DIAGNOSIS — N183 Chronic kidney disease, stage 3 unspecified: Secondary | ICD-10-CM | POA: Diagnosis not present

## 2019-11-28 DIAGNOSIS — I129 Hypertensive chronic kidney disease with stage 1 through stage 4 chronic kidney disease, or unspecified chronic kidney disease: Secondary | ICD-10-CM

## 2019-11-28 NOTE — Progress Notes (Signed)
This visit occurred during the SARS-CoV-2 public health emergency.  Safety protocols were in place, including screening questions prior to the visit, additional usage of staff PPE, and extensive cleaning of exam room while observing appropriate contact time as indicated for disinfecting solutions.  Subjective:     Patient ID: Connie Poole , female    DOB: Dec 28, 1920 , 86 y.o.   MRN: [id number]   Chief Complaint  Patient presents with  . Urinary Tract Infection  . Diabetes    HPI  She is brought in today by her daughter for further evaluation of possible UTI. Daughter is concerned because of intermittent changes in mental status. She also has h/o Lewy Body dementia. She denies fever, chills - she does report some discomfort with urination.   Diabetes She presents for her follow-up diabetic visit. She has type 2 diabetes mellitus. There are no hypoglycemic associated symptoms. Pertinent negatives for diabetes include no chest pain and no fatigue. There are no hypoglycemic complications. Symptoms are stable. Risk factors for coronary artery disease include diabetes mellitus, dyslipidemia, obesity, sedentary lifestyle and post-menopausal. She never participates in exercise. An ACE inhibitor/angiotensin II receptor blocker is being taken. Eye exam is not current.     Past Medical History:  Diagnosis Date  . Arthritis   . CHF (congestive heart failure) ([place])   . Dementia ([place])   . Diabetes mellitus   . Glaucoma   . Hyperlipidemia   . Hypertension   . Sarcoidosis      Family History  Problem Relation Age of Onset  . Stroke Mother   . Heart disease Father   . Prostate cancer Brother   . Cancer Brother   . Throat cancer Brother   . Alzheimer's disease Other   . Coronary artery disease Other   . Diabetes Other   . Hypertension Other      Current Outpatient Medications:  .  Accu-Chek Softclix Lancets lancets, ONE STEP, Disp: 100 each, Rfl:  .  aspirin 81 MG tablet, Take 81 mg  by mouth daily., Disp: , Rfl:  .  atorvastatin (LIPITOR) 40 MG tablet, TAKE 1 TABLET BY MOUTH EVERYDAY AT BEDTIME, Disp: 90 tablet, Rfl: 1 .  brimonidine (ALPHAGAN P) 0.1 % SOLN, Apply 1 drop to eye every 8 (eight) hours. , Disp: , Rfl:  .  Calcium Carbonate-Vitamin D (CALCIUM 600+D PO), Take 1 tablet by mouth daily. , Disp: , Rfl:  .  carvedilol (COREG) 25 MG tablet, TAKE 1 TABLET BY MOUTH TWICE A DAY, Disp: 180 tablet, Rfl: 1 .  Chlorphen-Pseudoephed-APAP (CORICIDIN D PO), Take 1 tablet by mouth as needed. cough , Disp: , Rfl:  .  Cholecalciferol (D3-50 PO), Take 50 mg by mouth daily., Disp: , Rfl:  .  clonazePAM (KLONOPIN) 0.5 MG tablet, TAKE 1/2 TO 1 TABLET BY MOUTH AT BEDTIME AS NEEDED, Disp: 30 tablet, Rfl: 5 .  Cranberry-Vit C-Probiotic-Ca (GNP CRANBERRY) TABS, Take by mouth 2 (two) times daily., Disp: , Rfl:  .  donepezil (ARICEPT) 5 MG tablet, TAKE 1 TABLET BY MOUTH EVERY DAY IN THE EVENING, Disp: 90 tablet, Rfl: 2 .  dorzolamide (TRUSOPT) 2 % ophthalmic solution, Place 1 drop into both eyes 2 (two) times daily. , Disp: , Rfl:  .  furosemide (LASIX) 40 MG tablet, TAKE 1 TABLET BY MOUTH EVERY DAY, Disp: 90 tablet, Rfl: 1 .  Glucose Blood (ACCU-CHEK COMPACT TEST DRUM VI), by In Vitro route., Disp: , Rfl:  .  hydrocortisone (ANUSOL-HC) 2.5 % rectal  cream, Place 1 application rectally 2 (two) times daily., Disp: 30 g, Rfl: 2 .  hydrocortisone (ANUSOL-HC) 25 MG suppository, Place 1 suppository (25 mg total) rectally 2 (two) times daily., Disp: 24 suppository, Rfl: 2 .  ipratropium (ATROVENT) 0.06 % nasal spray, Place 2 sprays into both nostrils every 8 (eight) hours as needed. , Disp: , Rfl:  .  KLOR-CON M20 20 MEQ tablet, TAKE 1 TABLET BY MOUTH EVERY DAY WITH FOOD, Disp: 90 tablet, Rfl: 1 .  LUMIGAN 0.01 % SOLN, Place 1 drop into the left eye at bedtime. , Disp: , Rfl: 6 .  Melatonin 10 MG TBCR, Take 1 tablet by mouth at bedtime., Disp: , Rfl:  .  Multiple Vitamins-Minerals (PRESERVISION  AREDS 2 PO), Take 1 tablet by mouth daily., Disp: , Rfl:  .  Omega-3 Fatty Acids (FISH OIL) 1000 MG CAPS, Take 1,000 mg by mouth daily., Disp: , Rfl:  .  sertraline (ZOLOFT) 50 MG tablet, TAKE 1 TABLET BY MOUTH EVERY DAY, Disp: 90 tablet, Rfl: 1 .  Suvorexant (BELSOMRA) 10 MG TABS, Take 10 mg by mouth at bedtime as needed., Disp: 30 tablet, Rfl: 2 .  telmisartan (MICARDIS) 80 MG tablet, TAKE 1 TABLET BY MOUTH EVERY DAY, Disp: 90 tablet, Rfl: 1 .  Turmeric ([facility] TURMERIC CURCUMIN) 500 MG CAPS, Take 1 tablet by mouth daily., Disp: , Rfl:    Allergies  Allergen Reactions  . Celebrex [Celecoxib] Nausea And Vomiting  . Vioxx [Rofecoxib] Nausea And Vomiting     Review of Systems  Constitutional: Negative.  Negative for fatigue.  Respiratory: Negative.   Cardiovascular: Negative.  Negative for chest pain.  Gastrointestinal: Negative.   Genitourinary: Positive for dysuria.  Neurological: Negative.   Psychiatric/Behavioral: Negative.      Today's Vitals   11/28/19 1602  BP: 130/88  Pulse: (!) 51  Temp: 98.3 F (36.8 C)  SpO2: 99%  Weight: 167 lb 12.8 oz (76.1 kg)  Height: 5' (1.524 m)  PainSc: 0-No pain   Body mass index is 32.77 kg/m.   Wt Readings from Last 3 Encounters:  11/28/19 167 lb 12.8 oz (76.1 kg)  12/14/18 185 lb (83.9 kg)  12/22/17 194 lb (88 kg)     Objective:  Physical Exam Vitals and nursing note reviewed.  Constitutional:      Appearance: Normal appearance. She is obese.  HENT:     Head: Normocephalic and atraumatic.  Cardiovascular:     Rate and Rhythm: Normal rate and regular rhythm.     Heart sounds: Normal heart sounds.  Pulmonary:     Effort: Pulmonary effort is normal.     Breath sounds: Normal breath sounds.  Skin:    General: Skin is warm.  Neurological:     General: No focal deficit present.     Mental Status: She is alert.  Psychiatric:        Mood and Affect: Mood normal.        Behavior: Behavior normal.         Assessment And  Plan:      1. Dysuria  I will check urinalysis and CBC w/ diff.   - CBC with Diff  2. Type 2 diabetes mellitus with stage 3 chronic kidney disease, unspecified whether long term insulin use, unspecified whether stage 3a or 3b CKD (HCC)  Chronic, I will check labs as listed below. This has been stable off of meds. Well controlled with diet.   - CBC with Diff - CMP14+EGFR -  Hemoglobin A1c - Lipid panel  3. Hypertensive nephropathy  Chronic, fair control. She will continue with current meds for now.   - TSH   Maximino Greenland, MD    THE PATIENT IS ENCOURAGED TO PRACTICE SOCIAL DISTANCING DUE TO THE COVID-19 PANDEMIC.

## 2019-11-29 LAB — CBC WITH DIFFERENTIAL/PLATELET
Basophils Absolute: 0 10*3/uL (ref 0.0–0.2)
Basos: 1 %
EOS (ABSOLUTE): 0.3 10*3/uL (ref 0.0–0.4)
Eos: 7 %
Hematocrit: 29.1 % — ABNORMAL LOW (ref 34.0–46.6)
Hemoglobin: 9.2 g/dL — ABNORMAL LOW (ref 11.1–15.9)
Immature Grans (Abs): 0 10*3/uL (ref 0.0–0.1)
Immature Granulocytes: 0 %
Lymphocytes Absolute: 1.8 10*3/uL (ref 0.7–3.1)
Lymphs: 38 %
MCH: 27.8 pg (ref 26.6–33.0)
MCHC: 31.6 g/dL (ref 31.5–35.7)
MCV: 88 fL (ref 79–97)
Monocytes Absolute: 0.6 10*3/uL (ref 0.1–0.9)
Monocytes: 14 %
Neutrophils Absolute: 1.9 10*3/uL (ref 1.4–7.0)
Neutrophils: 40 %
Platelets: 327 10*3/uL (ref 150–450)
RBC: 3.31 x10E6/uL — ABNORMAL LOW (ref 3.77–5.28)
RDW: 12.9 % (ref 11.7–15.4)
WBC: 4.6 10*3/uL (ref 3.4–10.8)

## 2019-11-29 LAB — CMP14+EGFR
ALT: 15 IU/L (ref 0–32)
AST: 14 IU/L (ref 0–40)
Albumin/Globulin Ratio: 1.5 (ref 1.2–2.2)
Albumin: 3.9 g/dL (ref 3.5–4.6)
Alkaline Phosphatase: 66 IU/L (ref 39–117)
BUN/Creatinine Ratio: 35 — ABNORMAL HIGH (ref 12–28)
BUN: 55 mg/dL — ABNORMAL HIGH (ref 10–36)
Bilirubin Total: <0.2 mg/dL (ref 0.0–1.2)
CO2: 24 mmol/L (ref 20–29)
Calcium: 9.2 mg/dL (ref 8.7–10.3)
Chloride: 105 mmol/L (ref 96–106)
Creatinine, Ser: 1.57 mg/dL — ABNORMAL HIGH (ref 0.57–1.00)
GFR calc Af Amer: 32 mL/min/{1.73_m2} — ABNORMAL LOW (ref >59)
GFR calc non Af Amer: 28 mL/min/{1.73_m2} — ABNORMAL LOW (ref >59)
Globulin, Total: 2.6 g/dL (ref 1.5–4.5)
Glucose: 142 mg/dL — ABNORMAL HIGH (ref 65–99)
Potassium: 4.6 mmol/L (ref 3.5–5.2)
Sodium: 140 mmol/L (ref 134–144)
Total Protein: 6.5 g/dL (ref 6.0–8.5)

## 2019-11-29 LAB — TSH: TSH: 1.78 u[IU]/mL (ref 0.450–4.500)

## 2019-11-29 LAB — LIPID PANEL
Chol/HDL Ratio: 4.4 ratio (ref 0.0–4.4)
Cholesterol, Total: 160 mg/dL (ref 100–199)
HDL: 36 mg/dL — ABNORMAL LOW (ref >39)
LDL Chol Calc (NIH): 98 mg/dL (ref 0–99)
Triglycerides: 148 mg/dL (ref 0–149)
VLDL Cholesterol Cal: 26 mg/dL (ref 5–40)

## 2019-11-29 LAB — HEMOGLOBIN A1C
Est. average glucose Bld gHb Est-mCnc: 131 mg/dL
Hgb A1c MFr Bld: 6.2 % — ABNORMAL HIGH (ref 4.8–5.6)

## 2019-12-04 ENCOUNTER — Other Ambulatory Visit: Payer: Self-pay | Admitting: Neurology

## 2019-12-05 ENCOUNTER — Other Ambulatory Visit: Payer: Self-pay | Admitting: Adult Health

## 2019-12-05 DIAGNOSIS — J411 Mucopurulent chronic bronchitis: Secondary | ICD-10-CM | POA: Diagnosis not present

## 2019-12-05 MED ORDER — CLONAZEPAM 0.5 MG PO TABS: 0.2500 mg | ORAL_TABLET | Freq: Every evening | ORAL | 5 refills | Status: AC | PRN

## 2019-12-05 NOTE — Telephone Encounter (Signed)
Connie Poole stating pt is needing a refill on her clonazePAM (KLONOPIN) 0.5 MG tablet [facility] [address] Pt is needing it for today.

## 2019-12-09 ENCOUNTER — Encounter: Payer: Self-pay | Admitting: Internal Medicine

## 2019-12-10 LAB — POCT URINALYSIS DIPSTICK
Bilirubin, UA: NEGATIVE
Blood, UA: NEGATIVE
Glucose, UA: NEGATIVE
Ketones, UA: NEGATIVE
Nitrite, UA: NEGATIVE
Protein, UA: NEGATIVE
Spec Grav, UA: 1.015 (ref 1.010–1.025)
Urobilinogen, UA: 0.2 E.U./dL
pH, UA: 5.5 (ref 5.0–8.0)

## 2019-12-13 ENCOUNTER — Telehealth: Payer: Self-pay

## 2019-12-13 ENCOUNTER — Emergency Department (HOSPITAL_BASED_OUTPATIENT_CLINIC_OR_DEPARTMENT_OTHER): Payer: Medicare PPO

## 2019-12-13 ENCOUNTER — Other Ambulatory Visit: Payer: Self-pay

## 2019-12-13 ENCOUNTER — Inpatient Hospital Stay (HOSPITAL_BASED_OUTPATIENT_CLINIC_OR_DEPARTMENT_OTHER)
Admission: EM | Admit: 2019-12-13 | Discharge: 2019-12-18 | DRG: 177 | Disposition: A | Discharge status: Home Health | Payer: Medicare PPO | Attending: Internal Medicine | Admitting: Internal Medicine

## 2019-12-13 ENCOUNTER — Encounter (HOSPITAL_BASED_OUTPATIENT_CLINIC_OR_DEPARTMENT_OTHER): Payer: Self-pay | Admitting: *Deleted

## 2019-12-13 DIAGNOSIS — M1990 Unspecified osteoarthritis, unspecified site: Secondary | ICD-10-CM | POA: Diagnosis present

## 2019-12-13 DIAGNOSIS — N1832 Chronic kidney disease, stage 3b: Secondary | ICD-10-CM | POA: Diagnosis present

## 2019-12-13 DIAGNOSIS — Z66 Do not resuscitate: Secondary | ICD-10-CM | POA: Diagnosis present

## 2019-12-13 DIAGNOSIS — D860 Sarcoidosis of lung: Secondary | ICD-10-CM | POA: Diagnosis present

## 2019-12-13 DIAGNOSIS — N183 Chronic kidney disease, stage 3 unspecified: Secondary | ICD-10-CM | POA: Diagnosis present

## 2019-12-13 DIAGNOSIS — E871 Hypo-osmolality and hyponatremia: Secondary | ICD-10-CM | POA: Diagnosis present

## 2019-12-13 DIAGNOSIS — Z8042 Family history of malignant neoplasm of prostate: Secondary | ICD-10-CM | POA: Diagnosis not present

## 2019-12-13 DIAGNOSIS — K5900 Constipation, unspecified: Secondary | ICD-10-CM | POA: Diagnosis not present

## 2019-12-13 DIAGNOSIS — Z7982 Long term (current) use of aspirin: Secondary | ICD-10-CM | POA: Diagnosis not present

## 2019-12-13 DIAGNOSIS — Z515 Encounter for palliative care: Secondary | ICD-10-CM | POA: Diagnosis not present

## 2019-12-13 DIAGNOSIS — J690 Pneumonitis due to inhalation of food and vomit: Secondary | ICD-10-CM | POA: Diagnosis present

## 2019-12-13 DIAGNOSIS — F0280 Dementia in other diseases classified elsewhere without behavioral disturbance: Secondary | ICD-10-CM | POA: Diagnosis present

## 2019-12-13 DIAGNOSIS — Z823 Family history of stroke: Secondary | ICD-10-CM

## 2019-12-13 DIAGNOSIS — Z833 Family history of diabetes mellitus: Secondary | ICD-10-CM | POA: Diagnosis not present

## 2019-12-13 DIAGNOSIS — Z20822 Contact with and (suspected) exposure to covid-19: Secondary | ICD-10-CM | POA: Diagnosis present

## 2019-12-13 DIAGNOSIS — E1122 Type 2 diabetes mellitus with diabetic chronic kidney disease: Secondary | ICD-10-CM | POA: Diagnosis present

## 2019-12-13 DIAGNOSIS — Z8249 Family history of ischemic heart disease and other diseases of the circulatory system: Secondary | ICD-10-CM

## 2019-12-13 DIAGNOSIS — I5032 Chronic diastolic (congestive) heart failure: Secondary | ICD-10-CM | POA: Diagnosis present

## 2019-12-13 DIAGNOSIS — Z7189 Other specified counseling: Secondary | ICD-10-CM | POA: Diagnosis not present

## 2019-12-13 DIAGNOSIS — Z808 Family history of malignant neoplasm of other organs or systems: Secondary | ICD-10-CM

## 2019-12-13 DIAGNOSIS — J189 Pneumonia, unspecified organism: Secondary | ICD-10-CM | POA: Diagnosis not present

## 2019-12-13 DIAGNOSIS — E860 Dehydration: Secondary | ICD-10-CM | POA: Diagnosis present

## 2019-12-13 DIAGNOSIS — J9601 Acute respiratory failure with hypoxia: Secondary | ICD-10-CM | POA: Diagnosis present

## 2019-12-13 DIAGNOSIS — R05 Cough: Secondary | ICD-10-CM | POA: Diagnosis not present

## 2019-12-13 DIAGNOSIS — R2689 Other abnormalities of gait and mobility: Secondary | ICD-10-CM | POA: Diagnosis not present

## 2019-12-13 DIAGNOSIS — Z96659 Presence of unspecified artificial knee joint: Secondary | ICD-10-CM | POA: Diagnosis present

## 2019-12-13 DIAGNOSIS — R531 Weakness: Secondary | ICD-10-CM | POA: Diagnosis not present

## 2019-12-13 DIAGNOSIS — H409 Unspecified glaucoma: Secondary | ICD-10-CM | POA: Diagnosis present

## 2019-12-13 DIAGNOSIS — I5033 Acute on chronic diastolic (congestive) heart failure: Secondary | ICD-10-CM | POA: Diagnosis not present

## 2019-12-13 DIAGNOSIS — Z888 Allergy status to other drugs, medicaments and biological substances status: Secondary | ICD-10-CM

## 2019-12-13 DIAGNOSIS — R0602 Shortness of breath: Secondary | ICD-10-CM | POA: Diagnosis present

## 2019-12-13 DIAGNOSIS — G3183 Dementia with Lewy bodies: Secondary | ICD-10-CM | POA: Diagnosis present

## 2019-12-13 DIAGNOSIS — E785 Hyperlipidemia, unspecified: Secondary | ICD-10-CM | POA: Diagnosis present

## 2019-12-13 DIAGNOSIS — I130 Hypertensive heart and chronic kidney disease with heart failure and stage 1 through stage 4 chronic kidney disease, or unspecified chronic kidney disease: Secondary | ICD-10-CM | POA: Diagnosis present

## 2019-12-13 DIAGNOSIS — I129 Hypertensive chronic kidney disease with stage 1 through stage 4 chronic kidney disease, or unspecified chronic kidney disease: Secondary | ICD-10-CM | POA: Diagnosis not present

## 2019-12-13 DIAGNOSIS — E1121 Type 2 diabetes mellitus with diabetic nephropathy: Secondary | ICD-10-CM | POA: Diagnosis not present

## 2019-12-13 LAB — COMPREHENSIVE METABOLIC PANEL
ALT: 30 U/L (ref 0–44)
AST: 22 U/L (ref 15–41)
Albumin: 3.4 g/dL — ABNORMAL LOW (ref 3.5–5.0)
Alkaline Phosphatase: 64 U/L (ref 38–126)
Anion gap: 11 (ref 5–15)
BUN: 58 mg/dL — ABNORMAL HIGH (ref 8–23)
CO2: 23 mmol/L (ref 22–32)
Calcium: 9 mg/dL (ref 8.9–10.3)
Chloride: 97 mmol/L — ABNORMAL LOW (ref 98–111)
Creatinine, Ser: 1.43 mg/dL — ABNORMAL HIGH (ref 0.44–1.00)
GFR calc Af Amer: 36 mL/min — ABNORMAL LOW (ref >60)
GFR calc non Af Amer: 31 mL/min — ABNORMAL LOW (ref >60)
Glucose, Bld: 183 mg/dL — ABNORMAL HIGH (ref 70–99)
Potassium: 4.4 mmol/L (ref 3.5–5.1)
Sodium: 131 mmol/L — ABNORMAL LOW (ref 135–145)
Total Bilirubin: 0.6 mg/dL (ref 0.3–1.2)
Total Protein: 7.8 g/dL (ref 6.5–8.1)

## 2019-12-13 LAB — CBC WITH DIFFERENTIAL/PLATELET
Abs Immature Granulocytes: 0.05 10*3/uL (ref 0.00–0.07)
Basophils Absolute: 0 10*3/uL (ref 0.0–0.1)
Basophils Relative: 0 %
Eosinophils Absolute: 0.4 10*3/uL (ref 0.0–0.5)
Eosinophils Relative: 3 %
HCT: 29.8 % — ABNORMAL LOW (ref 36.0–46.0)
Hemoglobin: 9.3 g/dL — ABNORMAL LOW (ref 12.0–15.0)
Immature Granulocytes: 0 %
Lymphocytes Relative: 14 %
Lymphs Abs: 1.7 10*3/uL (ref 0.7–4.0)
MCH: 27.8 pg (ref 26.0–34.0)
MCHC: 31.2 g/dL (ref 30.0–36.0)
MCV: 89.2 fL (ref 80.0–100.0)
Monocytes Absolute: 1.3 10*3/uL — ABNORMAL HIGH (ref 0.1–1.0)
Monocytes Relative: 11 %
Neutro Abs: 9.2 10*3/uL — ABNORMAL HIGH (ref 1.7–7.7)
Neutrophils Relative %: 72 %
Platelets: 360 10*3/uL (ref 150–400)
RBC: 3.34 MIL/uL — ABNORMAL LOW (ref 3.87–5.11)
RDW: 13.9 % (ref 11.5–15.5)
WBC: 12.7 10*3/uL — ABNORMAL HIGH (ref 4.0–10.5)
nRBC: 0 % (ref 0.0–0.2)

## 2019-12-13 LAB — PROTIME-INR
INR: 1.2 (ref 0.8–1.2)
Prothrombin Time: 14.5 seconds (ref 11.4–15.2)

## 2019-12-13 LAB — LACTIC ACID, PLASMA: Lactic Acid, Venous: 1 mmol/L (ref 0.5–1.9)

## 2019-12-13 LAB — RESPIRATORY PANEL BY RT PCR (FLU A&B, COVID)
Influenza A by PCR: NEGATIVE
Influenza B by PCR: NEGATIVE
SARS Coronavirus 2 by RT PCR: NEGATIVE

## 2019-12-13 MED ORDER — SODIUM CHLORIDE 0.9 % IV SOLN
1.0000 g | Freq: Once | INTRAVENOUS | Status: AC
  Administered 2019-12-13: 21:00:00 1 g via INTRAVENOUS
  Filled 2019-12-13: qty 10

## 2019-12-13 MED ORDER — SODIUM CHLORIDE 0.9 % IV SOLN
500.0000 mg | Freq: Once | INTRAVENOUS | Status: AC
  Administered 2019-12-13: 500 mg via INTRAVENOUS
  Filled 2019-12-13: qty 500

## 2019-12-13 MED ORDER — SODIUM CHLORIDE 0.9 % IV SOLN
INTRAVENOUS | Status: DC | PRN
  Administered 2019-12-13: 500 mL via INTRAVENOUS

## 2019-12-13 NOTE — ED Notes (Signed)
Placed patient on 2lpm [place] due to low SpO2 at 87%. No increase WOB or SHOB noted. Patient does have a productive congestive cough with mostly clear sputum with some tan secretions.

## 2019-12-13 NOTE — ED Provider Notes (Signed)
[facility] EMERGENCY DEPARTMENT Provider Note   CSN: [id number] Arrival date & time: 12/13/19  1833     History Chief Complaint  Patient presents with  . Shortness of Breath  . Cough    Connie Poole is a 86 y.o. female.  HPI      Hx of chronic cough but over the last few days has been worse, cough has been more congested sounding, now productive, light yellow, right before got here said she couldn't get her breath.  Today she seemed more short of breath No n/v/d Decreased appetite today, didn't eat a quiche as she normally does. Appetite has been progressively declining, however.   [name] providing hx Received COVID vax  Past Medical History:  Diagnosis Date  . Arthritis   . CHF (congestive heart failure) (HCC)   . Dementia (HCC)   . Diabetes mellitus   . Glaucoma   . Hyperlipidemia   . Hypertension   . Sarcoidosis     Patient Active Problem List   Diagnosis Date Noted  . Pneumonia of right upper lobe due to infectious organism 12/13/2019  . Type 2 diabetes mellitus with stage 3 chronic kidney disease, without long-term current use of insulin (HCC) 01/29/2019  . Recurrent UTI 01/29/2019  . Decreased mobility 12/25/2018  . Allergic rhinitis 12/25/2018  . Dysuria 12/25/2018  . Debility 12/25/2018  . Thrush 04/26/2018  . Hypertensive nephropathy 04/26/2018  . Cutaneous abscess of face 04/26/2018  . Wheezing 04/26/2018  . Confusion 04/26/2018  . 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 Formed visual hallucinations 11/18/2015  . Lewy body dementia without behavioral disturbance (HCC) 11/18/2015  . Irreducible umbilical hernia 06/13/2012    Past Surgical History:  Procedure Laterality Date  . ABDOMINAL HYSTERECTOMY    . TOTAL KNEE ARTHROPLASTY       OB History   No obstetric history on file.     Family History  Problem Relation Age of Onset  . Stroke Mother   . Heart disease Father   . Prostate cancer Brother   . Cancer  Brother   . Throat cancer Brother   . Alzheimer's disease Other   . Coronary artery disease Other   . Diabetes Other   . Hypertension Other     Social History   Tobacco Use  . Smoking status: Never Smoker  . Smokeless tobacco: Never Used  Substance Use Topics  . Alcohol use: No    Alcohol/week: 0.0 standard drinks  . Drug use: Not Currently    Home Medications Prior to Admission medications   Medication Sig Start Date End Date Taking? Authorizing Provider  Accu-Chek Softclix Lancets lancets ONE STEP 06/12/19   Dorothyann Peng, MD  aspirin 81 MG tablet Take 81 mg by mouth daily.    [provider]  atorvastatin (LIPITOR) 40 MG tablet TAKE 1 TABLET BY MOUTH EVERYDAY AT BEDTIME 08/07/19   Dorothyann Peng, MD  brimonidine (ALPHAGAN P) 0.1 % SOLN Apply 1 drop to eye every 8 (eight) hours.     [provider]  Calcium Carbonate-Vitamin D (CALCIUM 600+D PO) Take 1 tablet by mouth daily.     [provider]  carvedilol (COREG) 25 MG tablet TAKE 1 TABLET BY MOUTH TWICE A DAY 07/02/19   Dorothyann Peng, MD  Chlorphen-Pseudoephed-APAP (CORICIDIN D PO) Take 1 tablet by mouth as needed. cough     [provider]  Cholecalciferol (D3-50 PO) Take 50 mg by mouth daily.    [provider]  clonazePAM (KLONOPIN) 0.5 MG tablet TAKE 1/2 TO 1 TABLET BY MOUTH AT BEDTIME AS NEEDED 12/05/19   Dohmeier, Porfirio Mylar, MD  clonazePAM (KLONOPIN) 0.5 MG tablet Take 0.5-1 tablets (0.25-0.5 mg total) by mouth at bedtime as needed. 12/05/19   Dohmeier, Porfirio Mylar, MD  Cranberry-Vit C-Probiotic-Ca (GNP CRANBERRY) TABS Take by mouth 2 (two) times daily.    [provider]  donepezil (ARICEPT) 5 MG tablet TAKE 1 TABLET BY MOUTH EVERY DAY IN THE EVENING 06/25/19   Dorothyann Peng, MD  dorzolamide (TRUSOPT) 2 % ophthalmic solution Place 1 drop into both eyes 2 (two) times daily.     [provider]  furosemide (LASIX) 40 MG tablet TAKE 1 TABLET BY MOUTH EVERY DAY 06/26/19    Dorothyann Peng, MD  Glucose Blood (ACCU-CHEK COMPACT TEST DRUM VI) by In Vitro route.    [provider]  hydrocortisone (ANUSOL-HC) 2.5 % rectal cream Place 1 application rectally 2 (two) times daily. 04/26/19   Rodriguez-Southworth, Nettie Elm, PA-C  hydrocortisone (ANUSOL-HC) 25 MG suppository Place 1 suppository (25 mg total) rectally 2 (two) times daily. 04/26/19   Rodriguez-Southworth, Nettie Elm, PA-C  ipratropium (ATROVENT) 0.06 % nasal spray Place 2 sprays into both nostrils every 8 (eight) hours as needed.     [provider]  KLOR-CON M20 20 MEQ tablet TAKE 1 TABLET BY MOUTH EVERY DAY WITH FOOD 06/26/19   Dorothyann Peng, MD  LUMIGAN 0.01 % SOLN Place 1 drop into the left eye at bedtime.  04/26/18   [provider]  Melatonin 10 MG TBCR Take 1 tablet by mouth at bedtime.    [provider]  Multiple Vitamins-Minerals (PRESERVISION AREDS 2 PO) Take 1 tablet by mouth daily.    [provider]  Omega-3 Fatty Acids (FISH OIL) 1000 MG CAPS Take 1,000 mg by mouth daily.    [provider]  sertraline (ZOLOFT) 50 MG tablet TAKE 1 TABLET BY MOUTH EVERY DAY 06/26/19   Dorothyann Peng, MD  Suvorexant (BELSOMRA) 10 MG TABS Take 10 mg by mouth at bedtime as needed. 06/12/19   Arnette Felts, FNP  telmisartan (MICARDIS) 80 MG tablet TAKE 1 TABLET BY MOUTH EVERY DAY 04/09/19   Dorothyann Peng, MD  Turmeric ([employer] TURMERIC CURCUMIN) 500 MG CAPS Take 1 tablet by mouth daily.    [provider]    Allergies    Celebrex [celecoxib] and Vioxx [rofecoxib]  Review of Systems   Review of Systems  Unable to perform ROS: Dementia  Constitutional: Negative for fever. Appetite change: progressive.  Respiratory: Positive for cough and shortness of breath.   Cardiovascular: Negative for chest pain and leg swelling.  Gastrointestinal: Negative for abdominal pain, diarrhea and vomiting.  Musculoskeletal: Positive for arthralgias (arthritis).    Physical  Exam Updated Vital Signs BP (!) 160/64 (BP Location: Left Wrist)   Pulse 66   Temp 98.5 F (36.9 C) (Oral)   Resp (!) 21   Ht 5' (1.524 m)   Wt 76.1 kg   SpO2 100%   BMI 32.77 kg/m   Physical Exam Vitals and nursing note reviewed.  Constitutional:      General: She is not in acute distress.    Appearance: She is well-developed. She is not diaphoretic.  HENT:     Head: Normocephalic and atraumatic.  Eyes:     Conjunctiva/sclera: Conjunctivae normal.  Cardiovascular:     Rate and Rhythm: Normal rate and regular rhythm.     Heart sounds: Normal heart sounds. No murmur.  No friction rub. No gallop.   Pulmonary:     Effort: Pulmonary effort is normal. No respiratory distress.     Breath sounds: Normal breath sounds. No wheezing or rales.  Abdominal:     General: There is no distension.     Palpations: Abdomen is soft.     Tenderness: There is no abdominal tenderness. There is no guarding.  Musculoskeletal:        General: No tenderness.     Cervical back: Normal range of motion.  Skin:    General: Skin is warm and dry.     Findings: No erythema or rash.  Neurological:     Mental Status: She is alert.     ED Results / Procedures / Treatments   Labs (all labs ordered are listed, but only abnormal results are displayed) Labs Reviewed  COMPREHENSIVE METABOLIC PANEL - Abnormal; Notable for the following components:      Result Value   Sodium 131 (*)    Chloride 97 (*)    Glucose, Bld 183 (*)    BUN 58 (*)    Creatinine, Ser 1.43 (*)    Albumin 3.4 (*)    GFR calc non Af Amer 31 (*)    GFR calc Af Amer 36 (*)    All other components within normal limits  CBC WITH DIFFERENTIAL/PLATELET - Abnormal; Notable for the following components:   WBC 12.7 (*)    RBC 3.34 (*)    Hemoglobin 9.3 (*)    HCT 29.8 (*)    Neutro Abs 9.2 (*)    Monocytes Absolute 1.3 (*)    All other components within normal limits  RESPIRATORY PANEL BY RT PCR (FLU A&B, COVID)  CULTURE, BLOOD  (ROUTINE X 2)  CULTURE, BLOOD (ROUTINE X 2)  URINE CULTURE  LACTIC ACID, PLASMA  PROTIME-INR  LACTIC ACID, PLASMA  URINALYSIS, ROUTINE W REFLEX MICROSCOPIC  HEMOGLOBIN A1C  BLOOD GAS, ARTERIAL    EKG EKG Interpretation  Date/Time:  Thursday December 13 2019 18:51:37 EDT Ventricular Rate:  64 PR Interval:    QRS Duration: 112 QT Interval:  448 QTC Calculation: 463 R Axis:   55 Text Interpretation: Sinus rhythm Paired ventricular premature complexes Short PR interval Borderline intraventricular conduction delay Low voltage, extremity and precordial leads No significant change since last tracing Confirmed by Alvira Monday ([id number]) on 12/13/2019 7:04:16 PM   Radiology DG Chest Port 1 View  Result Date: 12/13/2019 CLINICAL DATA:  Shortness of breath and cough for 2 days EXAM: PORTABLE CHEST 1 VIEW COMPARISON:  05/12/2018 FINDINGS: Cardiac shadow is at the upper limits of normal in size but stable. Aortic calcifications are noted. The lungs are well aerated bilaterally. Patchy opacities are seen in the right upper lobe which may represent some early infiltrate. No sizable effusion is seen. No bony abnormality is noted. IMPRESSION: Patchy opacity in the right upper lobe which may represent some early infiltrate. Electronically Signed   By: Alcide Clever M.D.   On: 12/13/2019 19:32    Procedures Procedures (including critical care time)  Medications Ordered in ED Medications  0.9 %  sodium chloride infusion (500 mLs Intravenous New Bag/Given 12/13/19 2059)  insulin aspart (novoLOG) injection 0-15 Units (has no administration in time range)  cefTRIAXone (ROCEPHIN) 1 g in sodium chloride 0.9 % 100 mL IVPB (0 g Intravenous Stopped 12/13/19 2157)  azithromycin (ZITHROMAX) 500 mg in sodium chloride 0.9 % 250 mL IVPB (500 mg Intravenous New Bag/Given 12/13/19 2206)  ED Course  I have reviewed the triage vital signs and the nursing notes.  Pertinent labs & imaging results that were available  during my care of the patient were reviewed by me and considered in my medical decision making (see chart for details).    MDM Rules/Calculators/A&P                      86 year old female with a history of CHF, dementia, diabetes, hypertension, hyperlipidemia, sarcoidosis, presents with concern for productive cough and shortness of breath.  Patient has a saturation of 86% on room air on arrival.  She was placed on 2 L of oxygen with improvement.  Chest x-ray showed patchy opacities in the right upper lobe which may represent some early infiltrate, with labs show leukocytosis.  Given symptoms, vital signs, chest x-ray, feel this is consistent with pneumonia.  Given Rocephin and azithromycin.  Will admit for further care.   Final Clinical Impression(s) / ED Diagnoses Final diagnoses:  Community acquired pneumonia of right upper lobe of lung  Acute respiratory failure with hypoxia [facility])    Rx / DC Orders ED Discharge Orders    None       Gareth Morgan, MD 12/14/19 [id number]

## 2019-12-13 NOTE — ED Triage Notes (Signed)
SOB, congestion and cough x 2 days. She had her Covid Vaccine in February.

## 2019-12-13 NOTE — Telephone Encounter (Signed)
Return call to pete with [facility] to inform him that is was ok to recertify pt for therapy. I also informed him that provider sent orders for chest xray for congestion and SOB

## 2019-12-13 NOTE — ED Notes (Signed)
Still no urine in pure wick canister

## 2019-12-13 NOTE — Telephone Encounter (Addendum)
The pt's [name] was notified that because the pt has shortness of breath along with congestion that it would be best for the pt to go to urgent care for evaluation and that they would be able to do a chest x-ray also.  Mrs. Wallington said that her son has to help her with her mother and that she would like the order sent in for the chest x-ray in case she can't get her mother to an urgent care.

## 2019-12-14 ENCOUNTER — Telehealth: Payer: Self-pay

## 2019-12-14 ENCOUNTER — Observation Stay (HOSPITAL_COMMUNITY): Payer: Medicare PPO

## 2019-12-14 DIAGNOSIS — F0280 Dementia in other diseases classified elsewhere without behavioral disturbance: Secondary | ICD-10-CM | POA: Diagnosis present

## 2019-12-14 DIAGNOSIS — R0602 Shortness of breath: Secondary | ICD-10-CM | POA: Diagnosis not present

## 2019-12-14 DIAGNOSIS — E871 Hypo-osmolality and hyponatremia: Secondary | ICD-10-CM | POA: Diagnosis present

## 2019-12-14 DIAGNOSIS — G3183 Dementia with Lewy bodies: Secondary | ICD-10-CM | POA: Diagnosis present

## 2019-12-14 DIAGNOSIS — N1832 Chronic kidney disease, stage 3b: Secondary | ICD-10-CM

## 2019-12-14 DIAGNOSIS — E1122 Type 2 diabetes mellitus with diabetic chronic kidney disease: Secondary | ICD-10-CM | POA: Diagnosis present

## 2019-12-14 DIAGNOSIS — J690 Pneumonitis due to inhalation of food and vomit: Secondary | ICD-10-CM | POA: Diagnosis present

## 2019-12-14 DIAGNOSIS — E785 Hyperlipidemia, unspecified: Secondary | ICD-10-CM | POA: Diagnosis present

## 2019-12-14 DIAGNOSIS — Z66 Do not resuscitate: Secondary | ICD-10-CM | POA: Diagnosis present

## 2019-12-14 DIAGNOSIS — J9601 Acute respiratory failure with hypoxia: Secondary | ICD-10-CM | POA: Diagnosis present

## 2019-12-14 DIAGNOSIS — D860 Sarcoidosis of lung: Secondary | ICD-10-CM | POA: Diagnosis present

## 2019-12-14 DIAGNOSIS — K5900 Constipation, unspecified: Secondary | ICD-10-CM | POA: Diagnosis not present

## 2019-12-14 DIAGNOSIS — Z20822 Contact with and (suspected) exposure to covid-19: Secondary | ICD-10-CM | POA: Diagnosis present

## 2019-12-14 DIAGNOSIS — Z8042 Family history of malignant neoplasm of prostate: Secondary | ICD-10-CM | POA: Diagnosis not present

## 2019-12-14 DIAGNOSIS — J189 Pneumonia, unspecified organism: Secondary | ICD-10-CM

## 2019-12-14 DIAGNOSIS — M1990 Unspecified osteoarthritis, unspecified site: Secondary | ICD-10-CM | POA: Diagnosis present

## 2019-12-14 DIAGNOSIS — E1121 Type 2 diabetes mellitus with diabetic nephropathy: Secondary | ICD-10-CM

## 2019-12-14 DIAGNOSIS — R531 Weakness: Secondary | ICD-10-CM | POA: Diagnosis not present

## 2019-12-14 DIAGNOSIS — Z833 Family history of diabetes mellitus: Secondary | ICD-10-CM | POA: Diagnosis not present

## 2019-12-14 DIAGNOSIS — Z823 Family history of stroke: Secondary | ICD-10-CM | POA: Diagnosis not present

## 2019-12-14 DIAGNOSIS — Z7982 Long term (current) use of aspirin: Secondary | ICD-10-CM | POA: Diagnosis not present

## 2019-12-14 DIAGNOSIS — E860 Dehydration: Secondary | ICD-10-CM | POA: Diagnosis present

## 2019-12-14 DIAGNOSIS — I5033 Acute on chronic diastolic (congestive) heart failure: Secondary | ICD-10-CM | POA: Diagnosis not present

## 2019-12-14 DIAGNOSIS — Z7189 Other specified counseling: Secondary | ICD-10-CM | POA: Diagnosis not present

## 2019-12-14 DIAGNOSIS — I130 Hypertensive heart and chronic kidney disease with heart failure and stage 1 through stage 4 chronic kidney disease, or unspecified chronic kidney disease: Secondary | ICD-10-CM | POA: Diagnosis present

## 2019-12-14 DIAGNOSIS — I5032 Chronic diastolic (congestive) heart failure: Secondary | ICD-10-CM | POA: Diagnosis not present

## 2019-12-14 DIAGNOSIS — Z808 Family history of malignant neoplasm of other organs or systems: Secondary | ICD-10-CM | POA: Diagnosis not present

## 2019-12-14 DIAGNOSIS — Z96659 Presence of unspecified artificial knee joint: Secondary | ICD-10-CM | POA: Diagnosis present

## 2019-12-14 DIAGNOSIS — Z515 Encounter for palliative care: Secondary | ICD-10-CM | POA: Diagnosis not present

## 2019-12-14 DIAGNOSIS — Z8249 Family history of ischemic heart disease and other diseases of the circulatory system: Secondary | ICD-10-CM | POA: Diagnosis not present

## 2019-12-14 DIAGNOSIS — H409 Unspecified glaucoma: Secondary | ICD-10-CM | POA: Diagnosis present

## 2019-12-14 LAB — URINALYSIS, ROUTINE W REFLEX MICROSCOPIC
Bilirubin Urine: NEGATIVE
Glucose, UA: NEGATIVE mg/dL
Hgb urine dipstick: NEGATIVE
Ketones, ur: NEGATIVE mg/dL
Leukocytes,Ua: NEGATIVE
Nitrite: NEGATIVE
Protein, ur: NEGATIVE mg/dL
Specific Gravity, Urine: 1.013 (ref 1.005–1.030)
pH: 5 (ref 5.0–8.0)

## 2019-12-14 LAB — BLOOD GAS, ARTERIAL
Acid-base deficit: 1.5 mmol/L (ref 0.0–2.0)
Bicarbonate: 24.2 mmol/L (ref 20.0–28.0)
FIO2: 21
O2 Saturation: 82.5 %
Patient temperature: 98.6
pCO2 arterial: 48.9 mmHg — ABNORMAL HIGH (ref 32.0–48.0)
pH, Arterial: 7.315 — ABNORMAL LOW (ref 7.350–7.450)
pO2, Arterial: 50.9 mmHg — ABNORMAL LOW (ref 83.0–108.0)

## 2019-12-14 LAB — BASIC METABOLIC PANEL
Anion gap: 8 (ref 5–15)
BUN: 49 mg/dL — ABNORMAL HIGH (ref 8–23)
CO2: 25 mmol/L (ref 22–32)
Calcium: 8.7 mg/dL — ABNORMAL LOW (ref 8.9–10.3)
Chloride: 102 mmol/L (ref 98–111)
Creatinine, Ser: 1.31 mg/dL — ABNORMAL HIGH (ref 0.44–1.00)
GFR calc Af Amer: 40 mL/min — ABNORMAL LOW (ref >60)
GFR calc non Af Amer: 34 mL/min — ABNORMAL LOW (ref >60)
Glucose, Bld: 115 mg/dL — ABNORMAL HIGH (ref 70–99)
Potassium: 4.2 mmol/L (ref 3.5–5.1)
Sodium: 135 mmol/L (ref 135–145)

## 2019-12-14 LAB — EXPECTORATED SPUTUM ASSESSMENT W GRAM STAIN, RFLX TO RESP C

## 2019-12-14 LAB — CBC WITH DIFFERENTIAL/PLATELET
Abs Immature Granulocytes: 0.09 10*3/uL — ABNORMAL HIGH (ref 0.00–0.07)
Basophils Absolute: 0 10*3/uL (ref 0.0–0.1)
Basophils Relative: 0 %
Eosinophils Absolute: 0.3 10*3/uL (ref 0.0–0.5)
Eosinophils Relative: 4 %
HCT: 27.1 % — ABNORMAL LOW (ref 36.0–46.0)
Hemoglobin: 8.2 g/dL — ABNORMAL LOW (ref 12.0–15.0)
Immature Granulocytes: 1 %
Lymphocytes Relative: 20 %
Lymphs Abs: 1.7 10*3/uL (ref 0.7–4.0)
MCH: 27.9 pg (ref 26.0–34.0)
MCHC: 30.3 g/dL (ref 30.0–36.0)
MCV: 92.2 fL (ref 80.0–100.0)
Monocytes Absolute: 0.9 10*3/uL (ref 0.1–1.0)
Monocytes Relative: 11 %
Neutro Abs: 5.5 10*3/uL (ref 1.7–7.7)
Neutrophils Relative %: 64 %
Platelets: 312 10*3/uL (ref 150–400)
RBC: 2.94 MIL/uL — ABNORMAL LOW (ref 3.87–5.11)
RDW: 13.6 % (ref 11.5–15.5)
WBC: 8.5 10*3/uL (ref 4.0–10.5)
nRBC: 0 % (ref 0.0–0.2)

## 2019-12-14 LAB — MAGNESIUM: Magnesium: 2.1 mg/dL (ref 1.7–2.4)

## 2019-12-14 LAB — HIV ANTIBODY (ROUTINE TESTING W REFLEX): HIV Screen 4th Generation wRfx: NONREACTIVE

## 2019-12-14 LAB — HEMOGLOBIN A1C
Hgb A1c MFr Bld: 6.4 % — ABNORMAL HIGH (ref 4.8–5.6)
Mean Plasma Glucose: 136.98 mg/dL

## 2019-12-14 LAB — GLUCOSE, CAPILLARY
Glucose-Capillary: 89 mg/dL (ref 70–99)
Glucose-Capillary: 91 mg/dL (ref 70–99)
Glucose-Capillary: 85 mg/dL (ref 70–99)
Glucose-Capillary: 82 mg/dL (ref 70–99)

## 2019-12-14 LAB — STREP PNEUMONIAE URINARY ANTIGEN: Strep Pneumo Urinary Antigen: NEGATIVE

## 2019-12-14 MED ORDER — LACTATED RINGERS IV SOLN: INTRAVENOUS | Status: DC

## 2019-12-14 MED ORDER — ALBUTEROL SULFATE (2.5 MG/3ML) 0.083% IN NEBU: 2.5000 mg | INHALATION_SOLUTION | RESPIRATORY_TRACT | Status: DC | PRN

## 2019-12-14 MED ORDER — LACTATED RINGERS IV SOLN: INTRAVENOUS | Status: AC

## 2019-12-14 MED ORDER — DORZOLAMIDE HCL 2 % OP SOLN
1.0000 [drp] | Freq: Two times a day (BID) | OPHTHALMIC | Status: DC
  Administered 2019-12-14 – 2019-12-18 (×6): 1 [drp] via OPHTHALMIC
  Filled 2019-12-14: qty 10

## 2019-12-14 MED ORDER — SERTRALINE HCL 50 MG PO TABS
50.0000 mg | ORAL_TABLET | Freq: Every day | ORAL | Status: DC
  Administered 2019-12-14 – 2019-12-18 (×5): 50 mg via ORAL
  Filled 2019-12-14 (×5): qty 1

## 2019-12-14 MED ORDER — IRBESARTAN 150 MG PO TABS
300.0000 mg | ORAL_TABLET | Freq: Every day | ORAL | Status: DC
  Administered 2019-12-14 – 2019-12-18 (×5): 300 mg via ORAL
  Filled 2019-12-14 (×6): qty 2

## 2019-12-14 MED ORDER — INSULIN ASPART 100 UNIT/ML ~~LOC~~ SOLN
0.0000 [IU] | Freq: Three times a day (TID) | SUBCUTANEOUS | Status: DC
  Administered 2019-12-16: 3 [IU] via SUBCUTANEOUS

## 2019-12-14 MED ORDER — ASPIRIN EC 81 MG PO TBEC
81.0000 mg | DELAYED_RELEASE_TABLET | Freq: Every day | ORAL | Status: DC
  Administered 2019-12-14 – 2019-12-18 (×5): 81 mg via ORAL
  Filled 2019-12-14 (×5): qty 1

## 2019-12-14 MED ORDER — ATORVASTATIN CALCIUM 40 MG PO TABS
40.0000 mg | ORAL_TABLET | Freq: Every day | ORAL | Status: DC
  Administered 2019-12-14: 22:00:00 40 mg via ORAL
  Filled 2019-12-14 (×3): qty 1

## 2019-12-14 MED ORDER — SODIUM CHLORIDE 0.9 % IV SOLN
500.0000 mg | INTRAVENOUS | Status: DC
  Administered 2019-12-14 – 2019-12-17 (×4): 500 mg via INTRAVENOUS
  Filled 2019-12-14 (×4): qty 500

## 2019-12-14 MED ORDER — SODIUM CHLORIDE 0.9 % IV SOLN
1.0000 g | INTRAVENOUS | Status: DC
  Administered 2019-12-14 – 2019-12-18 (×5): 1 g via INTRAVENOUS
  Filled 2019-12-14 (×5): qty 1

## 2019-12-14 MED ORDER — MELATONIN 5 MG PO TABS
10.0000 mg | ORAL_TABLET | Freq: Every day | ORAL | Status: DC
  Filled 2019-12-14 (×3): qty 2

## 2019-12-14 MED ORDER — BRIMONIDINE TARTRATE 0.15 % OP SOLN
1.0000 [drp] | Freq: Two times a day (BID) | OPHTHALMIC | Status: DC
  Administered 2019-12-14 – 2019-12-18 (×6): 1 [drp] via OPHTHALMIC
  Filled 2019-12-14: qty 5

## 2019-12-14 MED ORDER — LATANOPROST 0.005 % OP SOLN
1.0000 [drp] | Freq: Every day | OPHTHALMIC | Status: DC
  Administered 2019-12-14 – 2019-12-17 (×2): 1 [drp] via OPHTHALMIC
  Filled 2019-12-14: qty 2.5

## 2019-12-14 MED ORDER — RESOURCE THICKENUP CLEAR PO POWD
ORAL | Status: DC | PRN
  Filled 2019-12-14 (×2): qty 125

## 2019-12-14 MED ORDER — DONEPEZIL HCL 5 MG PO TABS
5.0000 mg | ORAL_TABLET | Freq: Every day | ORAL | Status: DC
  Administered 2019-12-14: 22:00:00 5 mg via ORAL
  Filled 2019-12-14 (×3): qty 1

## 2019-12-14 MED ORDER — CARVEDILOL 25 MG PO TABS
25.0000 mg | ORAL_TABLET | Freq: Two times a day (BID) | ORAL | Status: DC
  Administered 2019-12-14 – 2019-12-18 (×7): 25 mg via ORAL
  Filled 2019-12-14 (×9): qty 1

## 2019-12-14 MED ORDER — ACETAMINOPHEN 325 MG PO TABS
650.0000 mg | ORAL_TABLET | Freq: Four times a day (QID) | ORAL | Status: DC | PRN
  Administered 2019-12-14: 20:00:00 650 mg via ORAL
  Filled 2019-12-14 (×2): qty 2

## 2019-12-14 NOTE — Progress Notes (Signed)
   12/14/19 1949  Assess: MEWS Score  Temp (!) 101 F (38.3 C)  BP (!) 165/53  Pulse Rate 61  Resp (!) 22  SpO2 95 %  Assess: MEWS Score  MEWS Temp 1  MEWS Systolic 0  MEWS Pulse 0  MEWS RR 1  MEWS LOC 0  MEWS Score 2  MEWS Score Color Yellow  Assess: if the MEWS score is Yellow or Red  Were vital signs taken at a resting state? Yes  Focused Assessment Documented focused assessment  Early Detection of Sepsis Score *See Row Information* High  MEWS guidelines implemented *See Row Information* Yes  Treat  MEWS Interventions Administered prn meds/treatments  Take Vital Signs  Increase Vital Sign Frequency  Yellow: Q 2hr X 2 then Q 4hr X 2, if remains yellow, continue Q 4hrs  Escalate  MEWS: Escalate Yellow: discuss with charge nurse/RN and consider discussing with provider and RRT  Notify: Charge Nurse/RN  Name of Charge Nurse/RN Notified Debbie RN  Date Charge Nurse/RN Notified 12/14/19  Time Charge Nurse/RN Notified 2000  Notify: Provider  Provider Name/Title Linton Flemings, NP  Date Provider Notified 12/14/19  Time Provider Notified 1949  Notification Type Page  Notification Reason Change in status  Response See new orders  Date of Provider Response 12/14/19  Time of Provider Response 1949  Document  Progress note created (see row info) Yes   Upon cleaning patient with NT, patient felt warm to touch. Previous VS taken did not show elevated temp. NT suggested taken rectal temp. Rectal temp 101.0. No orders for tylenol. Provider on call notified. New orders placed for PRN tylenol. Yellow MEWS guidelines initiated. Will continue to monitor.

## 2019-12-14 NOTE — Evaluation (Signed)
Clinical/Bedside Swallow Evaluation Patient Details  Name: Connie Poole MRN: [id number] Date of Birth: [birth year]  Today's Date: 12/14/2019 Time: SLP Start Time (ACUTE ONLY): 68 SLP Stop Time (ACUTE ONLY): 1550 SLP Time Calculation (min) (ACUTE ONLY): 35 min  Past Medical History:  Past Medical History:  Diagnosis Date  . Arthritis   . CHF (congestive heart failure) ([place])   . Dementia ([place])   . Diabetes mellitus   . Glaucoma   . Hyperlipidemia   . Hypertension   . Sarcoidosis    Past Surgical History:  Past Surgical History:  Procedure Laterality Date  . ABDOMINAL HYSTERECTOMY    . TOTAL KNEE ARTHROPLASTY     HPI:  86 year old female with past medical history of diabetes mellitus type 2, hypertension, hyperlipidemia, Lewy body dementia, diastolic ingestive heart failure and pulmonary sarcoidosis. CXR =Persistent rather subtle increased opacity in right upper lobe,concerning for early developing pneumonia   Assessment / Plan / Recommendation Clinical Impression  Pt seen at bedside for assessment of swallow function and safety, and to identify least restrictive diet. Pt's son and grandson were present during this evaluation. Oral care with suction was attempted. PT noted to have excessive oral secretions with anterior loss. Pt refused to cooperate with oral care. Pt also refused any po intake, which was attempted by SLP, son, and grandson. At this time, recommendation for least restrictive diet is challenging, given pt's lack of participation. Discussed with MD, and decision was made to downgrade diet to more conservative diet of puree and nectar thick liquids for now. SLP will reassess over the weekend.  SLP Visit Diagnosis: Dysphagia, unspecified (R13.10)    Aspiration Risk  Moderate aspiration risk    Diet Recommendation Dysphagia 1 (Puree);Nectar-thick liquid   Liquid Administration via: Straw;Cup;Spoon Medication Administration: Crushed with puree Supervision: Full  supervision/cueing for compensatory strategies Compensations: Slow rate;Small sips/bites;Minimize environmental distractions Postural Changes: Seated upright at 90 degrees;Remain upright for at least 30 minutes after po intake    Other  Recommendations Oral Care Recommendations: Oral care QID Other Recommendations: Have oral suction available   Follow up Recommendations 24 hour supervision/assistance      Frequency and Duration min 2x/week  1 week;2 weeks       Prognosis Prognosis for Safe Diet Advancement: Fair Barriers to Reach Goals: Cognitive deficits      Swallow Study   General Date of Onset: 12/13/19 HPI: 86 year old female with past medical history of diabetes mellitus type 2, hypertension, hyperlipidemia, Lewy body dementia, diastolic ingestive heart failure and pulmonary sarcoidosis. CXR =Persistent rather subtle increased opacity in right upper lobe,concerning for early developing pneumonia Type of Study: Bedside Swallow Evaluation Previous Swallow Assessment: none found Diet Prior to this Study: Regular;Thin liquids Temperature Spikes Noted: No Respiratory Status: Nasal cannula History of Recent Intubation: No Behavior/Cognition: Alert;Uncooperative;Requires cueing;Doesn't follow directions Oral Cavity Assessment: Excessive secretions Oral Care Completed by SLP: (attempted) Oral Cavity - Dentition: Edentulous Self-Feeding Abilities: Total assist Patient Positioning: Upright in bed Baseline Vocal Quality: Normal Volitional Cough: Cognitively unable to elicit Volitional Swallow: Unable to elicit    Oral/Motor/Sensory Function Overall Oral Motor/Sensory Function: Generalized oral weakness   Ice Chips Other Comments: PT refused ALLl po trials from SLP, son, and grandson   Thin Liquid   PT refused ALLl po trials from SLP, son, and grandson   Nectar Thick   PT refused ALLl po trials from SLP, son, and grandson  Honey Thick   PT refused ALLl po trials from SLP, son,  and  grandson  Puree   PT refused ALLl po trials from SLP, son, and grandson  Solid       PT refused ALLl po trials from SLP, son, and grandson    Merlene Laughter B. Murvin Poole, [facility], CCC-SLP Speech Language Pathologist Office: [phone] Pager: [phone]  Connie Poole 12/14/2019,3:50 PM

## 2019-12-14 NOTE — Telephone Encounter (Signed)
Received update that patient is in ED. Patient is currently being followed by [facility] NP). Hospital liaison team updated.

## 2019-12-14 NOTE — Progress Notes (Signed)
OT Cancellation Note  Patient Details Name: Connie Poole MRN: [id number] DOB: May 25, 1921   Cancelled Treatment:    Reason Eval/Treat Not Completed: Patient confused and resistive to all movement/participation with therapy. Unable to perform evaluation.  Nazareth Kirk L Maaliyah Adolph 12/14/2019, 4:08 PM

## 2019-12-14 NOTE — Progress Notes (Signed)
  Speech Language Pathology Treatment: Dysphagia(education with family)  Patient Details Name: Connie Poole MRN: [id number] DOB: 1921/01/29 Today's Date: 12/14/2019 Time: 4098-1191 SLP Time Calculation (min) (ACUTE ONLY): 15 min  Assessment / Plan / Recommendation Clinical Impression  SLP provided education to pt's son and grandson regarding safe swallow precautions, minimizing aspiration risk, and providing suction to facilitate management of oral secretions. Earlier today, SLP consulted with pt's daughter regarding pt status prior to admit - pt stopped wearing her dentures a few months ago, and has had limited po intake recently - mostly pureed and soft chopped solids.  SLP will reassess swallow function and diet recommendations over the weekend.    HPI HPI: 86 year old female with past medical history of diabetes mellitus type 2, hypertension, hyperlipidemia, Lewy body dementia, diastolic ingestive heart failure and pulmonary sarcoidosis. CXR =Persistent rather subtle increased opacity in right upper lobe,concerning for early developing pneumonia      SLP Plan  Continue with current plan of care       Recommendations  Diet recommendations: Dysphagia 1 (puree);Nectar-thick liquid Liquids provided via: Cup;Straw Medication Administration: Crushed with puree Supervision: Full supervision/cueing for compensatory strategies Compensations: Minimize environmental distractions;Slow rate;Small sips/bites Postural Changes and/or Swallow Maneuvers: Seated upright 90 degrees;Upright 30-60 min after meal                Oral Care Recommendations: Oral care QID;Oral care before and after PO Follow up Recommendations: 24 hour supervision/assistance SLP Visit Diagnosis: Dysphagia, unspecified (R13.10) Plan: Continue with current plan of care       GO               Jaiel Saraceno B. Murvin Natal, [facility], CCC-SLP Speech Language Pathologist Office: [phone] Pager: [phone]  [name] 12/14/2019, 4:18 PM

## 2019-12-14 NOTE — H&P (Addendum)
History and Physical    Connie Poole VZD:[id number] DOB: 27-Sep-1920 DOA: 12/13/2019  PCP: Dorothyann Peng, MD  Patient coming from: Home   Chief Complaint:  Chief Complaint  Patient presents with  . Shortness of Breath  . Cough     HPI:  86 year old female with past medical history of diabetes mellitus type 2, hypertension, hyperlipidemia, Lewy body dementia, diastolic ingestive heart failure and pulmonary sarcoidosis who presents to [facility] hospital as a transfer from med [facility] emergency department for suspected right upper lobe pneumonia.  Due to patient's advanced dementia she is an extremely poor historian and the majority of the history is been obtained from the daughter who is at the bedside.  According to the daughter, patient has exhibited a 1 day history of worsening cough compared to her baseline cough productive of significant amounts of yellow sputum, different from her scant amounts of white sputum at baseline.  Patient has also exhibited shortness of breath with past 24 hours.  In addition this patient exhibits generalized weakness, fatigue and poor appetite.  Daughter denies any recent travel, sick contacts or confirmed contacts with COVID-19.  Due to patient's progressively worsening symptoms the patient was brought into med [facility] emergency department for evaluation.  Upon evaluation in the emergency department patient was found to be hypoxic at 86% on room air on arrival.  Exhibit evidence of leukocytosis with a developing right upper lobe infiltrate on chest x-ray.  Due to concerns for developing right upper lobe pneumonia in this patient with hypoxia, the hospitalist group was called to assess the patient for admission the hospital.  Review of Systems: Unable to obtain due to patient's advanced dementia.  Past Medical History:  Diagnosis Date  . Arthritis   . CHF (congestive heart failure) (HCC)   . Dementia (HCC)   . Diabetes mellitus   .  Glaucoma   . Hyperlipidemia   . Hypertension   . Sarcoidosis     Past Surgical History:  Procedure Laterality Date  . ABDOMINAL HYSTERECTOMY    . TOTAL KNEE ARTHROPLASTY       reports that she has never smoked. She has never used smokeless tobacco. She reports previous drug use. She reports that she does not drink alcohol.  Allergies  Allergen Reactions  . Celebrex [Celecoxib] Nausea And Vomiting  . Vioxx [Rofecoxib] Nausea And Vomiting    Family History  Problem Relation Age of Onset  . Stroke Mother   . Heart disease Father   . Prostate cancer Brother   . Cancer Brother   . Throat cancer Brother   . Alzheimer's disease Other   . Coronary artery disease Other   . Diabetes Other   . Hypertension Other      Prior to Admission medications   Medication Sig Start Date End Date Taking? Authorizing Provider  Accu-Chek Softclix Lancets lancets ONE STEP 06/12/19   Dorothyann Peng, MD  aspirin 81 MG tablet Take 81 mg by mouth daily.    [provider]  atorvastatin (LIPITOR) 40 MG tablet TAKE 1 TABLET BY MOUTH EVERYDAY AT BEDTIME 08/07/19   Dorothyann Peng, MD  brimonidine (ALPHAGAN P) 0.1 % SOLN Apply 1 drop to eye every 8 (eight) hours.     [provider]  Calcium Carbonate-Vitamin D (CALCIUM 600+D PO) Take 1 tablet by mouth daily.     [provider]  carvedilol (COREG) 25 MG tablet TAKE 1 TABLET BY MOUTH TWICE A DAY 07/02/19  Dorothyann Peng, MD  Chlorphen-Pseudoephed-APAP (CORICIDIN D PO) Take 1 tablet by mouth as needed. cough     [provider]  Cholecalciferol (D3-50 PO) Take 50 mg by mouth daily.    [provider]  clonazePAM (KLONOPIN) 0.5 MG tablet TAKE 1/2 TO 1 TABLET BY MOUTH AT BEDTIME AS NEEDED 12/05/19   Dohmeier, Porfirio Mylar, MD  clonazePAM (KLONOPIN) 0.5 MG tablet Take 0.5-1 tablets (0.25-0.5 mg total) by mouth at bedtime as needed. 12/05/19   Dohmeier, Porfirio Mylar, MD  Cranberry-Vit C-Probiotic-Ca (GNP CRANBERRY) TABS Take by  mouth 2 (two) times daily.    [provider]  donepezil (ARICEPT) 5 MG tablet TAKE 1 TABLET BY MOUTH EVERY DAY IN THE EVENING 06/25/19   Dorothyann Peng, MD  dorzolamide (TRUSOPT) 2 % ophthalmic solution Place 1 drop into both eyes 2 (two) times daily.     [provider]  furosemide (LASIX) 40 MG tablet TAKE 1 TABLET BY MOUTH EVERY DAY 06/26/19   Dorothyann Peng, MD  Glucose Blood (ACCU-CHEK COMPACT TEST DRUM VI) by In Vitro route.    [provider]  hydrocortisone (ANUSOL-HC) 2.5 % rectal cream Place 1 application rectally 2 (two) times daily. 04/26/19   Rodriguez-Southworth, Nettie Elm, PA-C  hydrocortisone (ANUSOL-HC) 25 MG suppository Place 1 suppository (25 mg total) rectally 2 (two) times daily. 04/26/19   Rodriguez-Southworth, Nettie Elm, PA-C  ipratropium (ATROVENT) 0.06 % nasal spray Place 2 sprays into both nostrils every 8 (eight) hours as needed.     [provider]  KLOR-CON M20 20 MEQ tablet TAKE 1 TABLET BY MOUTH EVERY DAY WITH FOOD 06/26/19   Dorothyann Peng, MD  LUMIGAN 0.01 % SOLN Place 1 drop into the left eye at bedtime.  04/26/18   [provider]  Melatonin 10 MG TBCR Take 1 tablet by mouth at bedtime.    [provider]  Multiple Vitamins-Minerals (PRESERVISION AREDS 2 PO) Take 1 tablet by mouth daily.    [provider]  Omega-3 Fatty Acids (FISH OIL) 1000 MG CAPS Take 1,000 mg by mouth daily.    [provider]  sertraline (ZOLOFT) 50 MG tablet TAKE 1 TABLET BY MOUTH EVERY DAY 06/26/19   Dorothyann Peng, MD  Suvorexant (BELSOMRA) 10 MG TABS Take 10 mg by mouth at bedtime as needed. 06/12/19   Arnette Felts, FNP  telmisartan (MICARDIS) 80 MG tablet TAKE 1 TABLET BY MOUTH EVERY DAY 04/09/19   Dorothyann Peng, MD  Turmeric ([employer] TURMERIC CURCUMIN) 500 MG CAPS Take 1 tablet by mouth daily.    [provider]    Physical Exam: Vitals:   12/13/19 2148 12/13/19 2200 12/13/19 2234 12/14/19 0002  BP: (!) 140/54 (!)  149/51 (!) 144/61 (!) 160/64  Pulse: (!) 52 (!) 54 61 66  Resp: 17 (!) 22 18 (!) 21  Temp:    98.5 F (36.9 C)  TempSrc:    Oral  SpO2: 91% 96% 94% 100%  Weight:      Height:        Constitutional: Lethargic but arousable, oriented x1, not in acute distress. Skin: no rashes, no lesions, poor skin turgor noted. Eyes: Pupils are equally reactive to light.  No evidence of scleral icterus or conjunctival pallor.  ENMT: Dry mucous membranes noted.  Posterior pharynx clear of any exudate or lesions.  Poor dentition. Neck: normal, supple, no masses, no thyromegaly Respiratory: Coarse breath sounds bilaterally with bilateral rales, worse throughout the right lung fields.  Minimal expiratory wheezing noted.  Normal respiratory effort.  No accessory muscle use.  Cardiovascular: 3 out of 6 systolic murmur noted, regular rate and rhythm, Trace bilateral lower extremity pitting edema.  2+ pedal pulses. No carotid bruits.  Back:   Nontender without crepitus or deformity. Abdomen: Abdomen is soft and nontender.  No evidence of intra-abdominal masses.  Positive bowel sounds noted in all quadrants.   Musculoskeletal: No joint deformity upper and lower extremities. Good ROM, no contractures. Normal muscle tone.  Neurologic: Patient is not following commands.  Patient is moving all 4 extremities spontaneously.  Patient is responsive to both verbal and painful stimuli.  Patient is lethargic but arousable and disoriented.  Sensation is grossly intact. Psychiatric: Unable to fully assess due to advanced dementia.  Patient currently does not seem to possess insight as to her current situation.   Labs on Admission: I have personally reviewed following labs and imaging studies -   CBC: Recent Labs  Lab 12/13/19 1901  WBC 12.7*  NEUTROABS 9.2*  HGB 9.3*  HCT 29.8*  MCV 89.2  PLT 017   Basic Metabolic Panel: Recent Labs  Lab 12/13/19 1901  NA 131*  K 4.4  CL 97*  CO2 23  GLUCOSE 183*  BUN 58*    CREATININE 1.43*  CALCIUM 9.0   GFR: Estimated Creatinine Clearance: 21 mL/min (A) (by C-G formula based on SCr of 1.43 mg/dL (H)). Liver Function Tests: Recent Labs  Lab 12/13/19 1901  AST 22  ALT 30  ALKPHOS 64  BILITOT 0.6  PROT 7.8  ALBUMIN 3.4*   No results for input(s): LIPASE, AMYLASE in the last 168 hours. No results for input(s): AMMONIA in the last 168 hours. Coagulation Profile: Recent Labs  Lab 12/13/19 1901  INR 1.2   Cardiac Enzymes: No results for input(s): CKTOTAL, CKMB, CKMBINDEX, TROPONINI in the last 168 hours. BNP (last 3 results) No results for input(s): PROBNP in the last 8760 hours. HbA1C: No results for input(s): HGBA1C in the last 72 hours. CBG: No results for input(s): GLUCAP in the last 168 hours. Lipid Profile: No results for input(s): CHOL, HDL, LDLCALC, TRIG, CHOLHDL, LDLDIRECT in the last 72 hours. Thyroid Function Tests: No results for input(s): TSH, T4TOTAL, FREET4, T3FREE, THYROIDAB in the last 72 hours. Anemia Panel: No results for input(s): VITAMINB12, FOLATE, FERRITIN, TIBC, IRON, RETICCTPCT in the last 72 hours. Urine analysis:    Component Value Date/Time   COLORURINE YELLOW 05/12/2018 0458   APPEARANCEUR CLEAR 05/12/2018 0458   LABSPEC 1.011 05/12/2018 0458   PHURINE 7.0 05/12/2018 0458   GLUCOSEU NEGATIVE 05/12/2018 0458   HGBUR NEGATIVE 05/12/2018 0458   BILIRUBINUR negative 12/10/2019 1600   KETONESUR NEGATIVE 05/12/2018 0458   PROTEINUR Negative 12/10/2019 1600   PROTEINUR NEGATIVE 05/12/2018 0458   UROBILINOGEN 0.2 12/10/2019 1600   NITRITE negative 12/10/2019 1600   NITRITE NEGATIVE 05/12/2018 0458   LEUKOCYTESUR Trace (A) 12/10/2019 1600    Radiological Exams on Admission personally reviewed: DG Chest Port 1 View  Result Date: 12/13/2019 CLINICAL DATA:  Shortness of breath and cough for 2 days EXAM: PORTABLE CHEST 1 VIEW COMPARISON:  05/12/2018 FINDINGS: Cardiac shadow is at the upper limits of normal in size  but stable. Aortic calcifications are noted. The lungs are well aerated bilaterally. Patchy opacities are seen in the right upper lobe which may represent some early infiltrate. No sizable effusion is seen. No bony abnormality is noted. IMPRESSION: Patchy opacity in the right upper lobe which may represent some early infiltrate. Electronically Signed   By: Elta Guadeloupe  Lukens M.D.   On: 12/13/2019 19:32    EKG: Personally reviewed.  Rhythm is normal sinus rhythm at 62 bpm with PACs.  No dynamic ST segment changes appreciated.  Assessment/Plan Active Problems:   Pneumonia of right upper lobe due to infectious organism   Patient presenting with 1 day history of progressively worsening lethargy, weakness, poor appetite, shortness of breath and productive cough with yellow sputum in the setting of leukocytosis and right upper lobe infiltrate on chest x-ray  Patient is been placed on intravenous ceftriaxone and azithromycin  Providing patient with supplemental oxygen for concurrent acute hypoxic respiratory failure  Obtaining ABG on room air to determine degree of hypoxemia  Providing patient with as needed bronchodilator therapy  Hydrating patient with intravenous isotonic fluids considering concurrent dehydration with hyponatremia.  Blood and sputum cultures ordered  Chest physiotherapy  COVID-19 PCR testing negative    Acute respiratory failure with hypoxia (HCC)   Assessment and plan as noted above  I do not believe that his sarcoidosis flare is contributing to patient's presentation at this time.    Lewy body dementia without behavioral disturbance [facility])   Patient has advanced Lewy body dementia at baseline making clinical evaluation of mentation difficult.  According to daughter, patient is exhibiting decreased appetite and increased lethargy compared to her baseline.  Minimizing painful stimuli as much as possible  Encouraging family to remain at bedside as much as  possible.    Type 2 diabetes mellitus with stage 3 chronic kidney disease, without long-term current use of insulin (HCC)   Accu-Cheks before every meal and nightly with sliding scale insulin.  Obtaining hemoglobin A1c    Chronic kidney disease, stage 3b   Creatinine is near baseline.  Strict input and output monitoring  Renal function with serial chemistries     Chronic diastolic CHF (congestive heart failure) (HCC)  No clinical evidence of volume overload at this time  Echocardiogram and 2019 reveals preserved ejection fraction of 65 to 70% with diastolic dysfunction    Dehydration with hyponatremia    Mild hyponatremia noted with clinical evidence of dehydration  Hydrating patient gently with intravenous isotonic fluids  Monitoring sodium levels closely with serial chemistries.    Code Status:  DNR -daughter is the surrogate decision-maker Family Communication: Daughter has been updated on the plan of care and is at the bedside.  Status is: Observation  The patient remains OBS appropriate and will d/c before 2 midnights.  Dispo: The patient is from: Home              Anticipated d/c is to: Home              Anticipated d/c date is: 2 days              Patient currently is not medically stable to d/c.        Marinda Elk MD [facility] Pager [phone]  If 7PM-7AM, please contact night-coverage [email or web address] Use universal [place] password for that web site. If you do not have the password, please call the hospital operator.  12/14/2019, 12:53 AM

## 2019-12-14 NOTE — Evaluation (Signed)
Physical Therapy Evaluation Patient Details Name: Connie Poole MRN: [id number] DOB: June 02, 1921 Today's Date: 12/14/2019   History of Present Illness  86yo female presenting as a transfer from [place] due to suspicion of R upper lobe pneumonia. Hypoxic in the ED at 86% on room air and CXR with R upper lobe infiltration. Covid negative. PMH CHF, advanced lewy body dementia, DM, HLD, HTN, sarcoidosis, TKA  Clinical Impression   Patient received in bed, lethargic but with daughter present, who was able to provide all PLOF and home/home set up information; per daughter, patient much more lethargic and easily agitated than her usual. Attempted to convince patient to mobilize to EOB however she was resistant to even putting her socks on in bed, and quickly began to escalate her verbal levels of agitation. Did not attempt EOB/OOB mobility due to escalating agitation just interacting with her at bed level. Left in bed with all needs met and positioned to comfort. Daughter refusing SNF at this time and prefers her to return home if at all possible.     Follow Up Recommendations Home health PT;Supervision/Assistance - 24 hour    Equipment Recommendations  Other (comment)(well-equipped)    Recommendations for Other Services       Precautions / Restrictions Precautions Precautions: Fall;Other (comment) Precaution Comments: advanced dementia- can be agitated and combative Restrictions Weight Bearing Restrictions: No      Mobility  Bed Mobility               General bed mobility comments: unable to assess- patient with escalating agitation  Transfers                 General transfer comment: unable to assess- patient with escalating agitation  Ambulation/Gait             General Gait Details: unable to assess- patient with escalating agitation  Stairs            Wheelchair Mobility    Modified Rankin (Stroke Patients Only)       Balance                                              Pertinent Vitals/Pain Pain Assessment: No/denies pain    Home Living Family/patient expects to be discharged to:: Private residence Living Arrangements: Children Available Help at Discharge: Family;Available 24 hours/day;Personal care attendant Type of Home: House Home Access: Level entry     Home Layout: Multi-level;Able to live on main level with bedroom/bathroom Home Equipment: Programme researcher, broadcasting/film/video - 2 wheels;Bedside commode Additional Comments: daughter is primary caregiver and has PCAs coming to help throughout the week (3 aides at various schedules of 15-18 hours, has overnight help 3 nights a week)    Prior Function Level of Independence: Needs assistance   Gait / Transfers Assistance Needed: only ambulates with HHPT and RW; can transfers with family but per daughter "I'd rate her transfers at a C" and can be a heavy lift  ADL's / Homemaking Assistance Needed: max assist        Hand Dominance        Extremity/Trunk Assessment   Upper Extremity Assessment Upper Extremity Assessment: Defer to OT evaluation    Lower Extremity Assessment Lower Extremity Assessment: Difficult to assess due to impaired cognition    Cervical / Trunk Assessment Cervical / Trunk Assessment: Kyphotic  Communication  Communication: Receptive difficulties;Expressive difficulties  Cognition Arousal/Alertness: Lethargic Behavior During Therapy: Agitated Overall Cognitive Status: History of cognitive impairments - at baseline                                 General Comments: advanced lewy body dementia at baseline      General Comments General comments (skin integrity, edema, etc.): only able to perform bed level eval due to physical resistance and escalating agitation    Exercises     Assessment/Plan    PT Assessment Patient needs continued PT services  PT Problem List Decreased strength;Decreased  cognition;Decreased activity tolerance;Decreased safety awareness;Decreased balance;Decreased mobility;Decreased coordination       PT Treatment Interventions DME instruction;Balance training;Gait training;Functional mobility training;Patient/family education;Therapeutic activities;Therapeutic exercise;Wheelchair mobility training    PT Goals (Current goals can be found in the Care Plan section)  Acute Rehab PT Goals Patient Stated Goal: return home PT Goal Formulation: With family Time For Goal Achievement: 12/28/19 Potential to Achieve Goals: Fair    Frequency Min 2X/week   Barriers to discharge        Co-evaluation               AM-PAC PT "6 Clicks" Mobility  Outcome Measure Help needed turning from your back to your side while in a flat bed without using bedrails?: A Lot Help needed moving from lying on your back to sitting on the side of a flat bed without using bedrails?: A Lot Help needed moving to and from a bed to a chair (including a wheelchair)?: A Lot Help needed standing up from a chair using your arms (e.g., wheelchair or bedside chair)?: A Lot Help needed to walk in hospital room?: Total Help needed climbing 3-5 steps with a railing? : Total 6 Click Score: 10    End of Session   Activity Tolerance: Treatment limited secondary to agitation Patient left: in bed;with call bell/phone within reach;with bed alarm set;with family/visitor present   PT Visit Diagnosis: Other abnormalities of gait and mobility (R26.89)    Time: 9201-0071 PT Time Calculation (min) (ACUTE ONLY): 32 min   Charges:   PT Evaluation $PT Eval Moderate Complexity: 1 Mod(co-eval with OT)          Windell Norfolk, DPT, PN1   Supplemental Physical Therapist [place]    Pager [phone] Acute Rehab Office [phone]

## 2019-12-14 NOTE — Progress Notes (Signed)
I have seen and assessed patient and agree with Dr. Elinor Parkinson assessment and plan.  Patient is a 86 year old female with history of type 2 diabetes, hypertension, hyperlipidemia, Lewy body dementia, pulmonary sarcoidosis presented to the ED with suspected right upper lobe pneumonia.  Concern for aspiration.  Patient currently on empiric IV antibiotics.  Speech therapy evaluation pending.  Consult palliative care for goals of care.  No charge.

## 2019-12-15 DIAGNOSIS — J9601 Acute respiratory failure with hypoxia: Secondary | ICD-10-CM | POA: Diagnosis not present

## 2019-12-15 DIAGNOSIS — R531 Weakness: Secondary | ICD-10-CM

## 2019-12-15 DIAGNOSIS — N1832 Chronic kidney disease, stage 3b: Secondary | ICD-10-CM | POA: Diagnosis not present

## 2019-12-15 DIAGNOSIS — Z515 Encounter for palliative care: Secondary | ICD-10-CM

## 2019-12-15 DIAGNOSIS — J189 Pneumonia, unspecified organism: Secondary | ICD-10-CM

## 2019-12-15 DIAGNOSIS — Z7189 Other specified counseling: Secondary | ICD-10-CM

## 2019-12-15 DIAGNOSIS — G3183 Dementia with Lewy bodies: Secondary | ICD-10-CM

## 2019-12-15 DIAGNOSIS — F0280 Dementia in other diseases classified elsewhere without behavioral disturbance: Secondary | ICD-10-CM

## 2019-12-15 DIAGNOSIS — I5032 Chronic diastolic (congestive) heart failure: Secondary | ICD-10-CM | POA: Diagnosis not present

## 2019-12-15 DIAGNOSIS — K5900 Constipation, unspecified: Secondary | ICD-10-CM

## 2019-12-15 LAB — CBC WITH DIFFERENTIAL/PLATELET
Abs Immature Granulocytes: 0.04 10*3/uL (ref 0.00–0.07)
Basophils Absolute: 0.1 10*3/uL (ref 0.0–0.1)
Basophils Relative: 1 %
Eosinophils Absolute: 0.4 10*3/uL (ref 0.0–0.5)
Eosinophils Relative: 6 %
HCT: 27.5 % — ABNORMAL LOW (ref 36.0–46.0)
Hemoglobin: 8 g/dL — ABNORMAL LOW (ref 12.0–15.0)
Immature Granulocytes: 1 %
Lymphocytes Relative: 26 %
Lymphs Abs: 1.9 10*3/uL (ref 0.7–4.0)
MCH: 27.7 pg (ref 26.0–34.0)
MCHC: 29.1 g/dL — ABNORMAL LOW (ref 30.0–36.0)
MCV: 95.2 fL (ref 80.0–100.0)
Monocytes Absolute: 0.8 10*3/uL (ref 0.1–1.0)
Monocytes Relative: 11 %
Neutro Abs: 4.2 10*3/uL (ref 1.7–7.7)
Neutrophils Relative %: 55 %
Platelets: 338 10*3/uL (ref 150–400)
RBC: 2.89 MIL/uL — ABNORMAL LOW (ref 3.87–5.11)
RDW: 13.6 % (ref 11.5–15.5)
WBC: 7.5 10*3/uL (ref 4.0–10.5)
nRBC: 0 % (ref 0.0–0.2)

## 2019-12-15 LAB — BASIC METABOLIC PANEL
Anion gap: 8 (ref 5–15)
BUN: 37 mg/dL — ABNORMAL HIGH (ref 8–23)
CO2: 25 mmol/L (ref 22–32)
Calcium: 8.8 mg/dL — ABNORMAL LOW (ref 8.9–10.3)
Chloride: 106 mmol/L (ref 98–111)
Creatinine, Ser: 1.29 mg/dL — ABNORMAL HIGH (ref 0.44–1.00)
GFR calc Af Amer: 40 mL/min — ABNORMAL LOW (ref >60)
GFR calc non Af Amer: 35 mL/min — ABNORMAL LOW (ref >60)
Glucose, Bld: 69 mg/dL — ABNORMAL LOW (ref 70–99)
Potassium: 4.4 mmol/L (ref 3.5–5.1)
Sodium: 139 mmol/L (ref 135–145)

## 2019-12-15 LAB — GLUCOSE, CAPILLARY
Glucose-Capillary: 58 mg/dL — ABNORMAL LOW (ref 70–99)
Glucose-Capillary: 113 mg/dL — ABNORMAL HIGH (ref 70–99)
Glucose-Capillary: 96 mg/dL (ref 70–99)
Glucose-Capillary: 86 mg/dL (ref 70–99)
Glucose-Capillary: 97 mg/dL (ref 70–99)

## 2019-12-15 MED ORDER — SORBITOL 70 % SOLN
30.0000 mL | Freq: Once | Status: DC
  Filled 2019-12-15: qty 30

## 2019-12-15 MED ORDER — BISACODYL 10 MG RE SUPP: 10.0000 mg | Freq: Every day | RECTAL | Status: DC | PRN

## 2019-12-15 MED ORDER — IPRATROPIUM-ALBUTEROL 0.5-2.5 (3) MG/3ML IN SOLN
3.0000 mL | Freq: Two times a day (BID) | RESPIRATORY_TRACT | Status: DC
  Administered 2019-12-15 – 2019-12-17 (×5): 3 mL via RESPIRATORY_TRACT
  Filled 2019-12-15 (×5): qty 3

## 2019-12-15 MED ORDER — ACETAMINOPHEN 650 MG RE SUPP
650.0000 mg | Freq: Four times a day (QID) | RECTAL | Status: DC | PRN
  Filled 2019-12-15: qty 1

## 2019-12-15 MED ORDER — LORATADINE 10 MG PO TABS
10.0000 mg | ORAL_TABLET | Freq: Every day | ORAL | Status: DC
  Administered 2019-12-15 – 2019-12-18 (×4): 10 mg via ORAL
  Filled 2019-12-15 (×4): qty 1

## 2019-12-15 MED ORDER — FLUTICASONE PROPIONATE 50 MCG/ACT NA SUSP
2.0000 | Freq: Every day | NASAL | Status: DC
  Administered 2019-12-15 – 2019-12-18 (×4): 2 via NASAL
  Filled 2019-12-15: qty 16

## 2019-12-15 MED ORDER — DEXTROSE-NACL 5-0.9 % IV SOLN: INTRAVENOUS | Status: DC

## 2019-12-15 MED ORDER — BISACODYL 10 MG RE SUPP: 10.0000 mg | Freq: Every day | RECTAL | Status: DC

## 2019-12-15 MED ORDER — ENOXAPARIN SODIUM 30 MG/0.3ML ~~LOC~~ SOLN
30.0000 mg | SUBCUTANEOUS | Status: DC
  Administered 2019-12-15 – 2019-12-18 (×4): 30 mg via SUBCUTANEOUS
  Filled 2019-12-15 (×4): qty 0.3

## 2019-12-15 MED ORDER — HYDRALAZINE HCL 20 MG/ML IJ SOLN
5.0000 mg | Freq: Three times a day (TID) | INTRAMUSCULAR | Status: DC | PRN
  Administered 2019-12-16: 5 mg via INTRAVENOUS
  Filled 2019-12-15: qty 1

## 2019-12-15 MED ORDER — GUAIFENESIN ER 600 MG PO TB12
1200.0000 mg | ORAL_TABLET | Freq: Two times a day (BID) | ORAL | Status: DC
  Administered 2019-12-15 – 2019-12-17 (×3): 1200 mg via ORAL
  Filled 2019-12-15 (×6): qty 2

## 2019-12-15 NOTE — Progress Notes (Signed)
Physical Therapy Treatment Patient Details Name: Connie Poole MRN: [id number] DOB: 1921/04/25 Today's Date: 12/15/2019    History of Present Illness 86yo female presenting as a transfer from [place] due to suspicion of R upper lobe pneumonia. Hypoxic in the ED at 86% on room air and CXR with R upper lobe infiltration. Covid negative. PMH CHF, advanced lewy body dementia, DM, HLD, HTN, sarcoidosis, TKA    PT Comments    Daughter [name] present during session and VERY helpful.  "I've been helping my mom for years".  Pt in bed on 2 lts at 100% and HR 54.  Trial removed oxygen and monitored.General Comments: AxO x 2  (person/place) responds to name and simple functional VC's.  Sweet. Assisted to EOB required [facility] assist.  General bed mobility comments: Required + 2 Total Assist to transfer from supine to EOB.  Utilized bed pad to complete and required Max Assist ans still unable to achieve upright seated posture.  Severe posterior lean and B LE extension.  EOB x 4 min to attempt midline was unsuccessful.  Pt slowly slidding off bed, therapist had to block knees.  General transfer comment: attempted sit to stand howebver pt unable to clear hips off bed.  So used "[employer]" block knees tech to achieve sit to stand Max Assist pt was able to support approx 75% of her weight but then required increased assist to complete 1/4 turn to [facility] with second assist behind guiding hips.  Pt offered no more than  15%. Also assisted from [facility] to recliner same fashion and daughter assisted with peri care as pt had a large BM.  Positioned in recliner to comfort.  Max Move pad already placed in chair for nursing staff to use to assist pt back to bed.  Notified RN about the BM and that pt is a [employer].  Trial RA decreased to 88% so reapplied nasal at 1 lt.   Follow Up Recommendations  Home health PT;Supervision/Assistance - 24 hour(daughter declining SNF)     Equipment Recommendations  (has everything (hosp bed, BSC,  WC, RW))    Recommendations for Other Services       Precautions / Restrictions Precautions Precautions: Fall Precaution Comments: Nectar Thicken Liquids Restrictions Weight Bearing Restrictions: No    Mobility  Bed Mobility Overal bed mobility: Needs Assistance Bed Mobility: Rolling;Sit to Supine Rolling: +2 for physical assistance;+2 for safety/equipment;Max assist;Total assist     Sit to supine: +2 for physical assistance;+2 for safety/equipment;Max assist;Total assist   General bed mobility comments: Required + 2 Total Assist to transfer from supine to EOB.  Utilized bed pad to complete and required Max Assist ans still unable to achieve upright seated posture.  Severe posterior lean and B LE extension.  EOB x 4 min to attempt midline was unsuccessful.  Pt slowly slidding off bed, therapist had to block knees.  Transfers Overall transfer level: Needs assistance Equipment used: None Transfers: Sit to/from [facility] Sit to Stand: Max assist;Total assist;+2 physical assistance;+2 safety/equipment Stand pivot transfers: +2 physical assistance;+2 safety/equipment;Total assist       General transfer comment: attempted sit to stand howebver pt unable to clear hips off bed.  So used "[employer]" block knees tech to achieve sit to stand Max Assist pt was able to support approx 75% of her weight but then required increased assist to complete 1/4 turn to [facility] with second assist behind guiding hips.  Pt offered no more than  15%  Ambulation/Gait             General Gait Details: pt transfers only at home bed/wheelchair/recliner   Stairs             Wheelchair Mobility    Modified Rankin (Stroke Patients Only)       Balance                                            Cognition Arousal/Alertness: Awake/alert   Overall Cognitive Status: Difficult to assess                                 General Comments: AxO x 2   (person/place) responds to name and simple functional VC's.  Sweet.      Exercises      General Comments        Pertinent Vitals/Pain Pain Assessment: No/denies pain    Home Living                      Prior Function            PT Goals (current goals can now be found in the care plan section) Progress towards PT goals: Progressing toward goals    Frequency    Min 2X/week      PT Plan Current plan remains appropriate    Co-evaluation              AM-PAC PT "6 Clicks" Mobility   Outcome Measure  Help needed turning from your back to your side while in a flat bed without using bedrails?: Total Help needed moving from lying on your back to sitting on the side of a flat bed without using bedrails?: Total Help needed moving to and from a bed to a chair (including a wheelchair)?: Total Help needed standing up from a chair using your arms (e.g., wheelchair or bedside chair)?: Total Help needed to walk in hospital room?: Total Help needed climbing 3-5 steps with a railing? : Total 6 Click Score: 6    End of Session Equipment Utilized During Treatment: Gait belt Activity Tolerance: Patient tolerated treatment well;Patient limited by fatigue Patient left: in chair;with call bell/phone within reach;with chair alarm set;with family/visitor present Nurse Communication: Mobility status;Need for lift equipment(pt had a large BM in [facility]) PT Visit Diagnosis: Other abnormalities of gait and mobility (R26.89)     Time: 7616-0737 PT Time Calculation (min) (ACUTE ONLY): 45 min  Charges:  $Gait Training: 8-22 mins $Therapeutic Activity: 23-37 mins                     Felecia Shelling  PTA Acute  Rehabilitation Services Pager      [phone] Office      [phone]

## 2019-12-15 NOTE — Progress Notes (Signed)
  Speech Language Pathology Treatment: Dysphagia  Patient Details Name: Connie Poole MRN: [id number] DOB: 1921-04-11 Today's Date: 12/15/2019 Time: 2992-4268 SLP Time Calculation (min) (ACUTE ONLY): 23 min  Assessment / Plan / Recommendation Clinical Impression  Patient seen for dysphagia therapy and diet tolerance. Pt's daughter and grandson were present. Pt was sleeping when entering room, but woke to her name and was able to answer questions. Pt's daughter reported she has been tolerating nectar thick water and juice as well as a little grits this morning. She reports pt is feeling much better today. She was observed to tolerate pudding and nectar thickened water with no s/s of aspiration. Her vocal quality remained strong and clear. Continue current diet of Puree with nectar thickened liquids until pt recovers further.  ST will continue to follow pt for diet tolerance and tolerance for diet upgrade.   HPI HPI: 86 year old female with past medical history of diabetes mellitus type 2, hypertension, hyperlipidemia, Lewy body dementia, diastolic ingestive heart failure and pulmonary sarcoidosis. CXR =Persistent rather subtle increased opacity in right upper lobe,concerning for early developing pneumonia      SLP Plan  Continue with current plan of care       Recommendations  Diet recommendations: Dysphagia 1 (puree);Nectar-thick liquid Liquids provided via: Cup;Straw Medication Administration: Crushed with puree Supervision: Full supervision/cueing for compensatory strategies Compensations: Minimize environmental distractions;Slow rate;Small sips/bites Postural Changes and/or Swallow Maneuvers: Seated upright 90 degrees;Upright 30-60 min after meal                Plan: Continue with current plan of care       GO                Luis Abed., MA, CCC-SLP 12/15/2019, 3:06 PM

## 2019-12-15 NOTE — Consult Note (Signed)
Consultation Note Date: 12/15/2019   Patient Name: Connie Poole  DOB: July 18, 1921  MRN: [id number]  Age / Sex: 86 y.o., female  PCP: Dorothyann Peng, MD Referring Physician: Rodolph Bong, MD  Reason for Consultation: Establishing goals of care  HPI/Patient Profile: 86 y.o. female  admitted on 12/13/2019    Clinical Assessment and Goals of Care: 86 year old lady with diabetes hypertension hyperlipidemia Lewy body dementia diastolic congestive heart failure pulmonary sarcoidosis.  Lives at home with her Connie and has home-based palliative care.  Patient admitted to the hospital with suspected right upper lobe pneumonia with complaints of worsening cough and shortness of breath at home.  Patient has been admitted to hospital medicine service found to have leukocytosis and right upper lobe infiltrate on chest x-ray alongside hypoxia with oxygen saturations 86% on room air upon arrival.  Patient undergoing antibiotic oxygen and bronchodilator therapy.  Patient has also been seen by speech-language pathology with recommendations for dysphagia 1 diet.  Has also been seen by physical therapy.  Noted to have secretions.  Palliative consult for further goals of care discussions has been requested.  [name] is sitting up in a chair.  She dozes off for most of our conversation.  She opens her eyes and looks around briefly at times.  Does not verbalize.  Connie Poole present at the bedside along with Dois Poole son.  Introduced myself and palliative care as follows:  Palliative medicine is specialized medical care for people living with serious illness. It focuses on providing relief from the symptoms and stress of a serious illness. The goal is to improve quality of life for both the patient and the family.  Goals of care: Broad aims of medical therapy in relation to the patient's values and preferences. Our aim is to  provide medical care aimed at enabling patients to achieve the goals that matter most to them, given the circumstances of their particular medical situation and their constraints.   Goals wishes and values important to the patient and the family as a unit attempted to be explored.  We discussed about the patient's underlying conditions which are serious and irreversible such as Lewy body dementia.  Patient has had slow gradual progressive decline but not any accelerated decline.  We talked about decline trajectory from a dementia standpoint.  Connie is aware.  Difference between hospice and palliative was discussed.  For now, patient's Connie does not wish for her to go to rehab facility.  She wishes to continue to care for the patient at home in the patient's familiar surroundings with utmost care and dignity.  She is happy with current palliative care following the patient at home and wishes to continue the same.  She hopes to resume physical therapy efforts to whatever extent possible for the patient to continue to have range of motion and any other gentle physical therapy efforts are to continue.  Hence, goals are not for comfort measures only at this point.  Please note additional discussions as noted below.  Thank you  for the consult.  HCPOA Connie Poole  SUMMARY OF RECOMMENDATIONS   Patient's Connie Poole is a designated healthcare power of attorney agent she has completed living will and other advanced directives paperwork and states that patient's CODE STATUS is DO NOT RESUSCITATE. Continue current mode of care, goals are not for comfort measures only, not desiring of hospice support at this time.  Patient to continue scope of current hospitalization, to resume palliative services at home upon discharge alongside physical therapy efforts at home as per goals of care discussions with patient's Connie Poole at the bedside today. Thank you for the consult Code  Status/Advance Care Planning:  DNR    Symptom Management:    as above.   Palliative Prophylaxis:   Delirium Protocol     Psycho-social/Spiritual:   Desire for further Chaplaincy support:yes  Additional Recommendations: Caregiving  Support/Resources  Prognosis:   < 12 months  Discharge Planning: Home with Palliative Services      Primary Diagnoses: Present on Admission: . Pneumonia of right upper lobe due to infectious organism . Acute respiratory failure with hypoxia (HCC) . Lewy body dementia without behavioral disturbance (HCC) . Type 2 diabetes mellitus with stage 3 chronic kidney disease, without long-term current use of insulin (HCC) . Chronic kidney disease, stage 3b . Chronic diastolic CHF (congestive heart failure) (HCC) . Dehydration with hyponatremia   I have reviewed the medical record, interviewed the patient and family, and examined the patient. The following aspects are pertinent.  Past Medical History:  Diagnosis Date  . Arthritis   . CHF (congestive heart failure) (HCC)   . Dementia (HCC)   . Diabetes mellitus   . Glaucoma   . Hyperlipidemia   . Hypertension   . Sarcoidosis    Social History   Socioeconomic History  . Marital status: Widowed    Spouse name: Not on file  . Number of children: Not on file  . Years of education: Not on file  . Highest education level: Not on file  Occupational History  . Not on file  Tobacco Use  . Smoking status: Never Smoker  . Smokeless tobacco: Never Used  Substance and Sexual Activity  . Alcohol use: No    Alcohol/week: 0.0 standard drinks  . Drug use: Not Currently  . Sexual activity: Not Currently  Other Topics Concern  . Not on file  Social History Narrative   06/01/18 lives with Connie, Dois Poole   Social Determinants of Health   Financial Resource Strain:   . Difficulty of Paying Living Expenses:   Food Insecurity:   . Worried About Programme researcher, broadcasting/film/video in the Last Year:   . Occupational psychologist in the Last Year:   Transportation Needs:   . Freight forwarder (Medical):   Marland Kitchen Lack of Transportation (Non-Medical):   Physical Activity:   . Days of Exercise per Week:   . Minutes of Exercise per Session:   Stress:   . Feeling of Stress :   Social Connections:   . Frequency of Communication with Friends and Family:   . Frequency of Social Gatherings with Friends and Family:   . Attends Religious Services:   . Active Member of Clubs or Organizations:   . Attends Banker Meetings:   Marland Kitchen Marital Status:    Family History  Problem Relation Age of Onset  . Stroke Mother   . Heart disease Father   . Prostate cancer Brother   . Cancer Brother   .  Throat cancer Brother   . Alzheimer's disease Other   . Coronary artery disease Other   . Diabetes Other   . Hypertension Other    Scheduled Meds: . aspirin EC  81 mg Oral Daily  . atorvastatin  40 mg Oral QHS  . bisacodyl  10 mg Rectal Daily  . brimonidine  1 drop Both Eyes BID  . carvedilol  25 mg Oral BID  . donepezil  5 mg Oral QHS  . dorzolamide  1 drop Both Eyes BID  . enoxaparin (LOVENOX) injection  30 mg Subcutaneous Q24H  . guaiFENesin  1,200 mg Oral BID  . insulin aspart  0-15 Units Subcutaneous TID AC & HS  . irbesartan  300 mg Oral Daily  . latanoprost  1 drop Left Eye QHS  . melatonin  10 mg Oral QHS  . sertraline  50 mg Oral Daily  . sorbitol  30 mL Oral Once   Continuous Infusions: . sodium chloride Stopped (12/13/19 2302)  . azithromycin (ZITHROMAX) 500 MG IVPB (Vial-Mate [employer]) Stopped (12/14/19 2245)  . cefTRIAXone (ROCEPHIN)  IV Stopped (12/14/19 2110)  . dextrose 5 % and 0.9% NaCl     PRN Meds:.sodium chloride, acetaminophen, acetaminophen, albuterol, bisacodyl, Resource ThickenUp Clear Medications Prior to Admission:  Prior to Admission medications   Medication Sig Start Date End Date Taking? Authorizing Provider  aspirin 81 MG tablet Take 81 mg by mouth daily.   Yes  [provider]  atorvastatin (LIPITOR) 40 MG tablet TAKE 1 TABLET BY MOUTH EVERYDAY AT BEDTIME Patient taking differently: Take 40 mg by mouth every Monday, Tuesday, Wednesday, Thursday, and Friday.  08/07/19  Yes Dorothyann Peng, MD  benzonatate (TESSALON) 100 MG capsule Take 100 mg by mouth 3 (three) times daily as needed for cough. 12/04/19  Yes [provider]  brimonidine (ALPHAGAN P) 0.1 % SOLN Apply 1 drop to eye in the morning, at noon, and at bedtime.    Yes [provider]  Calcium Carbonate-Vitamin D (CALCIUM 600+D PO) Take 1 tablet by mouth daily.    Yes [provider]  carvedilol (COREG) 25 MG tablet TAKE 1 TABLET BY MOUTH TWICE A DAY Patient taking differently: Take 25 mg by mouth in the morning and at bedtime.  07/02/19  Yes Dorothyann Peng, MD  Chlorphen-Pseudoephed-APAP (CORICIDIN D PO) Take 1 tablet by mouth as needed (cough).    Yes [provider]  Cholecalciferol (D3-50 PO) Take 50 mg by mouth daily.   Yes [provider]  clonazePAM (KLONOPIN) 0.5 MG tablet TAKE 1/2 TO 1 TABLET BY MOUTH AT BEDTIME AS NEEDED Patient taking differently: Take 0.25-0.5 mg by mouth at bedtime as needed for anxiety.  12/05/19  Yes Dohmeier, Porfirio Mylar, MD  Cranberry-Vit C-Probiotic-Ca (GNP CRANBERRY) TABS Take 1 tablet by mouth daily.    Yes [provider]  donepezil (ARICEPT) 5 MG tablet TAKE 1 TABLET BY MOUTH EVERY DAY IN THE EVENING Patient taking differently: Take 5 mg by mouth daily.  06/25/19  Yes Dorothyann Peng, MD  dorzolamide (TRUSOPT) 2 % ophthalmic solution Place 1 drop into both eyes 2 (two) times daily.    Yes [provider]  furosemide (LASIX) 40 MG tablet TAKE 1 TABLET BY MOUTH EVERY DAY Patient taking differently: Take 40 mg by mouth daily.  06/26/19  Yes Dorothyann Peng, MD  hydrocortisone (ANUSOL-HC) 2.5 % rectal cream Place 1 application rectally 2 (two) times daily. Patient taking differently: Place 1 application  rectally 2 (two) times daily as  needed for hemorrhoids or anal itching.  04/26/19  Yes Rodriguez-Southworth, Sunday Spillers, PA-C  hydrocortisone (ANUSOL-HC) 25 MG suppository Place 1 suppository (25 mg total) rectally 2 (two) times daily. Patient taking differently: Place 25 mg rectally 2 (two) times daily as needed for hemorrhoids or anal itching.  04/26/19  Yes Rodriguez-Southworth, Sunday Spillers, PA-C  ipratropium (ATROVENT) 0.06 % nasal spray Place 2 sprays into both nostrils every 8 (eight) hours as needed.    Yes [provider]  KLOR-CON M20 20 MEQ tablet TAKE 1 TABLET BY MOUTH EVERY DAY WITH FOOD Patient taking differently: Take 20 mEq by mouth at bedtime.  06/26/19  Yes Glendale Chard, MD  LUMIGAN 0.01 % SOLN Place 1 drop into the left eye at bedtime.  04/26/18  Yes [provider]  Melatonin 10 MG TBCR Take 1 tablet by mouth at bedtime as needed (sleep aid).    Yes [provider]  Multiple Vitamins-Minerals (ADVANCED DIABETIC MULTIVITAMIN PO) Take 1 tablet by mouth daily.   Yes [provider]  Multiple Vitamins-Minerals (PRESERVISION AREDS 2 PO) Take 1 tablet by mouth daily.   Yes [provider]  Omega-3 Fatty Acids (FISH OIL) 1000 MG CAPS Take 1,000 mg by mouth daily.   Yes [provider]  sertraline (ZOLOFT) 50 MG tablet TAKE 1 TABLET BY MOUTH EVERY DAY Patient taking differently: Take 50 mg by mouth daily.  06/26/19  Yes Glendale Chard, MD  Suvorexant (BELSOMRA) 10 MG TABS Take 10 mg by mouth at bedtime as needed. 06/12/19  Yes Minette Brine, FNP  telmisartan (MICARDIS) 80 MG tablet TAKE 1 TABLET BY MOUTH EVERY DAY Patient taking differently: Take 80 mg by mouth daily.  04/09/19  Yes Glendale Chard, MD  Turmeric ([employer] TURMERIC CURCUMIN) 500 MG CAPS Take 1 tablet by mouth daily.   Yes [provider]  Accu-Chek Softclix Lancets lancets ONE STEP 06/12/19   Glendale Chard, MD  clonazePAM (KLONOPIN) 0.5 MG tablet Take 0.5-1 tablets (0.25-0.5  mg total) by mouth at bedtime as needed. Patient not taking: Reported on 12/14/2019 12/05/19   Dohmeier, Asencion Partridge, MD  Glucose Blood (ACCU-CHEK COMPACT TEST DRUM VI) by In Vitro route.    [provider]   Allergies  Allergen Reactions  . Celebrex [Celecoxib] Nausea And Vomiting  . Vioxx [Rofecoxib] Nausea And Vomiting   Review of Systems Weak elderly lady resting in a chair no distress Physical Exam Weak elderly lady resting in a chair no distress Appears weak and deconditioned Appears to have regular work of breathing Awakens and interact some Does not verbalize Has trace edema Abdomen not distended  Vital Signs: BP (!) 156/55 (BP Location: Left Arm)   Pulse (!) 58   Temp 98.3 F (36.8 C) (Axillary)   Resp 20   Ht 5' (1.524 m)   Wt 76.1 kg   SpO2 96%   BMI 32.77 kg/m  Pain Scale: PAINAD   Pain Score: 0-No pain   SpO2: SpO2: 96 % O2 Device:SpO2: 96 % O2 Flow Rate: .O2 Flow Rate (L/min): 2 L/min  IO: Intake/output summary:   Intake/Output Summary (Last 24 hours) at 12/15/2019 1430 Last data filed at 12/15/2019 0500 Gross per 24 hour  Intake 350 ml  Output 975 ml  Net -625 ml    LBM:   Baseline Weight: Weight: 76.1 kg Most recent weight: Weight: 76.1 kg     Palliative Assessment/Data:   Palliative performance scale 40%  Time In:  1330 Time Out:  1430 Time Total:  60  Greater than 50%  of this time was spent counseling and coordinating care related to the above assessment and plan.  Signed by: Loistine Chance, MD   Please contact Palliative Medicine Team phone at [phone] for questions and concerns.  For individual provider: See Shea Evans

## 2019-12-15 NOTE — Progress Notes (Signed)
PROGRESS NOTE    Connie Poole  YPP:[id number] DOB: 12/19/1920 DOA: 12/13/2019 PCP: Dorothyann Peng, MD    Chief Complaint  Patient presents with  . Shortness of Breath  . Cough    Brief Narrative:  HPI per Dr. Leafy Half 86 year old female with past medical history of diabetes mellitus type 2, hypertension, hyperlipidemia, Lewy body dementia, diastolic ingestive heart failure and pulmonary sarcoidosis who presents to [facility] long hospital as a transfer from med [facility] emergency department for suspected right upper lobe pneumonia.  Due to patient's advanced dementia she is an extremely poor historian and the majority of the history is been obtained from the daughter who is at the bedside.  According to the daughter, patient has exhibited a 1 day history of worsening cough compared to her baseline cough productive of significant amounts of yellow sputum, different from her scant amounts of white sputum at baseline.  Patient has also exhibited shortness of breath with past 24 hours.  In addition this patient exhibits generalized weakness, fatigue and poor appetite.  Daughter denies any recent travel, sick contacts or confirmed contacts with COVID-19.  Due to patient's progressively worsening symptoms the patient was brought into med [facility] emergency department for evaluation.  Upon evaluation in the emergency department patient was found to be hypoxic at 86% on room air on arrival.  Exhibit evidence of leukocytosis with a developing right upper lobe infiltrate on chest x-ray.  Due to concerns for developing right upper lobe pneumonia in this patient with hypoxia, the hospitalist group was called to assess the patient for admission the hospital.  Assessment & Plan:   Principal Problem:   Acute respiratory failure with hypoxia (HCC) Active Problems:   Pneumonia of right upper lobe due to infectious organism   Lewy body dementia without behavioral disturbance (HCC)   Type 2  diabetes mellitus with stage 3 chronic kidney disease, without long-term current use of insulin (HCC)   Chronic kidney disease, stage 3b   Chronic diastolic CHF (congestive heart failure) (HCC)   Dehydration with hyponatremia  1 acute respiratory failure with hypoxia secondary to aspiration pneumonia versus CAP Patient had presented with a 1 day history of progressive worsening lethargy, weakness, decreased appetite, shortness of breath and a productive cough of yellowish sputum.  Chest x-ray done concerning for right upper lobe infiltrate.  19 PCR negative.  Patient pancultured results pending.  Patient noted to have a temperature of 100.5 overnight.  Patient currently on 2 L nasal cannula.  Urine strep pneumococcus antigen negative.  Continue empiric IV azithromycin, IV Rocephin.  Place on Mucinex twice daily, Flonase, Claritin, scheduled duo nebs.  SLP pending.  Continue current dysphagia 1 diet.  2.  Lewy body dementia without behavioral disturbance Patient with advanced Lewy body dementia at baseline.  Daughter at bedside.  Patient now presenting acute respiratory distress with hypoxia and probable aspiration pneumonia.  SLP evaluation pending.  Patient noted to be following by palliative care in the outpatient setting.  Concern that patient may be having declining worsening dementia and as such we will consult with palliative care for further evaluation and management.  Continue Aricept and Zoloft.  3.  Chronic kidney disease stage IIIb Stable.  4.  Well-controlled diabetes mellitus type 2 Hemoglobin A1c 6.4.  CBG 58 this morning.  Likely low blood glucose levels secondary to poor oral intake.  Placed on D5 normal saline at [facility].  Discontinue sliding scale insulin.  Continue to follow CBGs.  5.  Chronic diastolic CHF 2D echo from 2019 showed a preserved EF of 65 to 70% with diastolic dysfunction.  Follow.  6.  Dehydration with hyponatremia Improved with hydration.  Saline lock IV fluids.   Follow.  7.  Constipation Sorbitol p.o. x1.  Dulcolax suppository prn.  DVT prophylaxis: Lovenox Code Status: DNR Family Communication: Updated daughter at bedside. Disposition:   Status is: Inpatient    Dispo: The patient is from: Home              Anticipated d/c is to: Likely back home with palliative care following.              Anticipated d/c date is: Hopefully in 2 to 3 days once patient is afebrile, on oral antibiotics, improving clinically.              Patient currently on IV antibiotics, palliative care consultation pending, PT evaluation pending.  Patient with fever noted overnight.        Consultants:   Palliative care pending.  Procedures:  Chest x-ray 12/13/2019, 12/14/2019  Antimicrobials:   IV azithromycin 12/13/2019  IV Rocephin 12/13/2019   Subjective: Patient sitting up in bed.  Pleasantly confused.  Denies any significant chest pain.  Denies any significant shortness of breath.  Per daughter patient constipated.  Patient with ongoing cough per daughter.  Patient with fever overnight with a temp of 100.5.   Objective: Vitals:   12/14/19 2117 12/15/19 0159 12/15/19 0619 12/15/19 0916  BP: (!) 154/53 (!) 151/52 (!) 163/61 (!) 156/55  Pulse: 61 (!) 59 60 (!) 58  Resp: (!) 21 (!) 21 20 20   Temp: (!) 100.5 F (38.1 C) 98.9 F (37.2 C) 99.1 F (37.3 C) 98.3 F (36.8 C)  TempSrc:  Oral  Axillary  SpO2: 96% 93% 99% 96%  Weight:      Height:        Intake/Output Summary (Last 24 hours) at 12/15/2019 1510 Last data filed at 12/15/2019 0500 Gross per 24 hour  Intake 350 ml  Output 975 ml  Net -625 ml   Filed Weights   12/13/19 1838  Weight: 76.1 kg    Examination:  General exam: NAD Respiratory system: Coarse breath sounds noted diffusely.  Normal respiratory effort.  Cardiovascular system: Regular rate rhythm no murmurs rubs or gallops.  No JVD.  No lower extremity edema.  Gastrointestinal system: Abdomen is soft, nontender, nondistended,  positive bowel sounds.  No rebound.  No guarding.  Central nervous system: Alert and oriented. No focal neurological deficits. Extremities: Symmetric 5 x 5 power. Skin: No rashes, lesions or ulcers Psychiatry: Judgement and insight appear normal. Mood & affect appropriate.     Data Reviewed: I have personally reviewed following labs and imaging studies  CBC: Recent Labs  Lab 12/13/19 1901 12/14/19 0931 12/15/19 0650  WBC 12.7* 8.5 7.5  NEUTROABS 9.2* 5.5 4.2  HGB 9.3* 8.2* 8.0*  HCT 29.8* 27.1* 27.5*  MCV 89.2 92.2 95.2  PLT 360 312 338    Basic Metabolic Panel: Recent Labs  Lab 12/13/19 1901 12/14/19 0804 12/15/19 0650  NA 131* 135 139  K 4.4 4.2 4.4  CL 97* 102 106  CO2 23 25 25   GLUCOSE 183* 115* 69*  BUN 58* 49* 37*  CREATININE 1.43* 1.31* 1.29*  CALCIUM 9.0 8.7* 8.8*  MG  --  2.1  --     GFR: Estimated Creatinine Clearance: 23.2 mL/min (A) (by C-G formula based on SCr of 1.29 mg/dL (H)).  Liver Function Tests: Recent Labs  Lab 12/13/19 1901  AST 22  ALT 30  ALKPHOS 64  BILITOT 0.6  PROT 7.8  ALBUMIN 3.4*    CBG: Recent Labs  Lab 12/14/19 1627 12/14/19 2121 12/15/19 0739 12/15/19 0911 12/15/19 1201  GLUCAP 85 82 58* 113* 96     Recent Results (from the past 240 hour(s))  Blood Culture (routine x 2)     Status: None (Preliminary result)   Collection Time: 12/13/19  7:38 PM   Specimen: BLOOD  Result Value Ref Range Status   Specimen Description   Final    BLOOD LEFT ANTECUBITAL Performed at [facility], [address] [place], [place] [address]    Special Requests   Final    BOTTLES DRAWN AEROBIC AND ANAEROBIC Blood Culture adequate volume Performed at [facility], [address] [place], [place] [address]    Culture   Final    NO GROWTH 2 DAYS Performed at [facility], [address] [place], [place] [address]    Report Status PENDING  Incomplete  Blood Culture (routine x 2)     Status: None  (Preliminary result)   Collection Time: 12/13/19  7:50 PM   Specimen: BLOOD  Result Value Ref Range Status   Specimen Description   Final    BLOOD RIGHT ANTECUBITAL Performed at [facility], [address] [place], [place] [address]    Special Requests   Final    BOTTLES DRAWN AEROBIC AND ANAEROBIC Blood Culture adequate volume Performed at [facility], [address] [place], [place] [address]    Culture   Final    NO GROWTH 2 DAYS Performed at [facility], [address], [place], [place] [address]    Report Status PENDING  Incomplete  Respiratory Panel by RT PCR (Flu A&B, Covid) - Nasopharyngeal Swab     Status: None   Collection Time: 12/13/19  9:09 PM   Specimen: Nasopharyngeal Swab  Result Value Ref Range Status   SARS Coronavirus 2 by RT PCR NEGATIVE NEGATIVE Final    Comment: (NOTE) SARS-CoV-2 target nucleic acids are NOT DETECTED. The SARS-CoV-2 RNA is generally detectable in upper respiratoy specimens during the acute phase of infection. The lowest concentration of SARS-CoV-2 viral copies this assay can detect is 131 copies/mL. A negative result does not preclude SARS-Cov-2 infection and should not be used as the sole basis for treatment or other patient management decisions. A negative result may occur with  improper specimen collection/handling, submission of specimen other than nasopharyngeal swab, presence of viral mutation(s) within the areas targeted by this assay, and inadequate number of viral copies (<131 copies/mL). A negative result must be combined with clinical observations, patient history, and epidemiological information. The expected result is Negative. Fact Sheet for Patients:  https://www.moore.com/ Fact Sheet for Healthcare Providers:  https://www.young.biz/ This test is not yet ap proved or cleared by the [place] FDA and  has been authorized for detection and/or  diagnosis of SARS-CoV-2 by FDA under an Emergency Use Authorization (EUA). This EUA will remain  in effect (meaning this test can be used) for the duration of the COVID-19 declaration under Section 564(b)(1) of the Act, 21 U.S.C. section 360bbb-3(b)(1), unless the authorization is terminated or revoked sooner.    Influenza A by PCR NEGATIVE NEGATIVE Final   Influenza B by PCR NEGATIVE NEGATIVE Final    Comment: (NOTE) The Xpert  Xpress SARS-CoV-2/FLU/RSV assay is intended as an aid in  the diagnosis of influenza from Nasopharyngeal swab specimens and  should not be used as a sole basis for treatment. Nasal washings and  aspirates are unacceptable for Xpert Xpress SARS-CoV-2/FLU/RSV  testing. Fact Sheet for Patients: https://www.moore.com/https://www.fda.gov/media/142436/download Fact Sheet for Healthcare Providers: https://www.young.biz/https://www.fda.gov/media/142435/download This test is not yet approved or cleared by the [place] FDA and  has been authorized for detection and/or diagnosis of SARS-CoV-2 by  FDA under an Emergency Use Authorization (EUA). This EUA will remain  in effect (meaning this test can be used) for the duration of the  Covid-19 declaration under Section 564(b)(1) of the Act, 21  U.S.C. section 360bbb-3(b)(1), unless the authorization is  terminated or revoked. Performed at [facility], [address] [place], [place] [address]   Expectorated sputum assessment w rflx to resp cult     Status: None   Collection Time: 12/14/19  1:16 AM   Specimen: Expectorated Sputum  Result Value Ref Range Status   Specimen Description EXPECTORATED SPUTUM  Final   Special Requests NONE  Final   Sputum evaluation   Final    THIS SPECIMEN IS ACCEPTABLE FOR SPUTUM CULTURE Performed at [facility], [address] [place], [place] [address]    Report Status 12/14/2019 FINAL  Final  Culture, respiratory     Status: None (Preliminary result)   Collection Time: 12/14/19  1:16 AM    Result Value Ref Range Status   Specimen Description   Final    EXPECTORATED SPUTUM Performed at [facility], [address] [place], [place] [address]    Special Requests   Final    NONE Reflexed from [id number] Performed at [facility], [address] [place], [place] [address]    Gram Stain   Final    RARE WBC PRESENT, PREDOMINANTLY PMN FEW GRAM POSITIVE COCCI IN PAIRS RARE GRAM POSITIVE RODS    Culture   Final    FEW Consistent with normal respiratory flora. Performed at [facility], [address] [place], [place] [address]    Report Status PENDING  Incomplete         Radiology Studies: DG Chest 2 View  Result Date: 12/14/2019 CLINICAL DATA:  Shortness of breath EXAM: CHEST - 2 VIEW COMPARISON:  December 13, 2019 FINDINGS: There remains rather subtle increased opacity in the right upper lobe. Lungs elsewhere are clear. Heart is enlarged with pulmonary vascularity normal. No adenopathy. There is aortic atherosclerosis. Degenerative change noted in left shoulder and thoracic spine. IMPRESSION: Persistent rather subtle increased opacity in right upper lobe, concerning for early developing pneumonia. No new opacity evident. Heart enlarged. No adenopathy. Aortic Atherosclerosis (ICD10-I70.0). Electronically Signed   By: Bretta BangWilliam  Woodruff III M.D.   On: 12/14/2019 10:40   DG Chest Port 1 View  Result Date: 12/13/2019 CLINICAL DATA:  Shortness of breath and cough for 2 days EXAM: PORTABLE CHEST 1 VIEW COMPARISON:  05/12/2018 FINDINGS: Cardiac shadow is at the upper limits of normal in size but stable. Aortic calcifications are noted. The lungs are well aerated bilaterally. Patchy opacities are seen in the right upper lobe which may represent some early infiltrate. No sizable effusion is seen. No bony abnormality is noted. IMPRESSION: Patchy opacity in the right upper lobe which may represent some early infiltrate. Electronically Signed   By:  Alcide CleverMark  Lukens M.D.   On: 12/13/2019 19:32  Scheduled Meds: . aspirin EC  81 mg Oral Daily  . atorvastatin  40 mg Oral QHS  . bisacodyl  10 mg Rectal Daily  . brimonidine  1 drop Both Eyes BID  . carvedilol  25 mg Oral BID  . donepezil  5 mg Oral QHS  . dorzolamide  1 drop Both Eyes BID  . enoxaparin (LOVENOX) injection  30 mg Subcutaneous Q24H  . fluticasone  2 spray Each Nare Daily  . guaiFENesin  1,200 mg Oral BID  . insulin aspart  0-15 Units Subcutaneous TID AC & HS  . ipratropium-albuterol  3 mL Nebulization BID  . irbesartan  300 mg Oral Daily  . latanoprost  1 drop Left Eye QHS  . loratadine  10 mg Oral Daily  . melatonin  10 mg Oral QHS  . sertraline  50 mg Oral Daily  . sorbitol  30 mL Oral Once   Continuous Infusions: . sodium chloride Stopped (12/13/19 2302)  . azithromycin (ZITHROMAX) 500 MG IVPB (Vial-Mate [employer]) Stopped (12/14/19 2245)  . cefTRIAXone (ROCEPHIN)  IV Stopped (12/14/19 2110)  . dextrose 5 % and 0.9% NaCl 10 mL/hr at 12/15/19 1507     LOS: 1 day    Time spent: 35 minutes    Irine Seal, MD [facility]   To contact the attending provider between 7A-7P or the covering provider during after hours 7P-7A, please log into the web site [email or web address] and access using universal [place] password for that web site. If you do not have the password, please call the hospital operator.  12/15/2019, 3:10 PM

## 2019-12-15 NOTE — Progress Notes (Signed)
Pt increasingly confused at night time. Pt refusing PO meds, family assisted and at bedside. On call paged and orders received. Will monitor BP.

## 2019-12-16 ENCOUNTER — Inpatient Hospital Stay (HOSPITAL_COMMUNITY): Payer: Medicare PPO

## 2019-12-16 DIAGNOSIS — I5033 Acute on chronic diastolic (congestive) heart failure: Secondary | ICD-10-CM | POA: Diagnosis not present

## 2019-12-16 DIAGNOSIS — N1832 Chronic kidney disease, stage 3b: Secondary | ICD-10-CM | POA: Diagnosis not present

## 2019-12-16 DIAGNOSIS — J9601 Acute respiratory failure with hypoxia: Secondary | ICD-10-CM | POA: Diagnosis not present

## 2019-12-16 DIAGNOSIS — J189 Pneumonia, unspecified organism: Secondary | ICD-10-CM | POA: Diagnosis not present

## 2019-12-16 LAB — BASIC METABOLIC PANEL
Anion gap: 11 (ref 5–15)
BUN: 28 mg/dL — ABNORMAL HIGH (ref 8–23)
CO2: 24 mmol/L (ref 22–32)
Calcium: 9.2 mg/dL (ref 8.9–10.3)
Chloride: 106 mmol/L (ref 98–111)
Creatinine, Ser: 1.13 mg/dL — ABNORMAL HIGH (ref 0.44–1.00)
GFR calc Af Amer: 48 mL/min — ABNORMAL LOW (ref >60)
GFR calc non Af Amer: 41 mL/min — ABNORMAL LOW (ref >60)
Glucose, Bld: 105 mg/dL — ABNORMAL HIGH (ref 70–99)
Potassium: 4 mmol/L (ref 3.5–5.1)
Sodium: 141 mmol/L (ref 135–145)

## 2019-12-16 LAB — URINE CULTURE: Culture: <10000 — AB

## 2019-12-16 LAB — CBC
HCT: 32.7 % — ABNORMAL LOW (ref 36.0–46.0)
Hemoglobin: 9.6 g/dL — ABNORMAL LOW (ref 12.0–15.0)
MCH: 27.7 pg (ref 26.0–34.0)
MCHC: 29.4 g/dL — ABNORMAL LOW (ref 30.0–36.0)
MCV: 94.2 fL (ref 80.0–100.0)
Platelets: 409 10*3/uL — ABNORMAL HIGH (ref 150–400)
RBC: 3.47 MIL/uL — ABNORMAL LOW (ref 3.87–5.11)
RDW: 13.5 % (ref 11.5–15.5)
WBC: 8.6 10*3/uL (ref 4.0–10.5)
nRBC: 0 % (ref 0.0–0.2)

## 2019-12-16 LAB — CULTURE, RESPIRATORY W GRAM STAIN: Culture: NORMAL

## 2019-12-16 LAB — GLUCOSE, CAPILLARY
Glucose-Capillary: 100 mg/dL — ABNORMAL HIGH (ref 70–99)
Glucose-Capillary: 158 mg/dL — ABNORMAL HIGH (ref 70–99)
Glucose-Capillary: 154 mg/dL — ABNORMAL HIGH (ref 70–99)
Glucose-Capillary: 116 mg/dL — ABNORMAL HIGH (ref 70–99)

## 2019-12-16 LAB — BRAIN NATRIURETIC PEPTIDE: B Natriuretic Peptide: 793.2 pg/mL — ABNORMAL HIGH (ref 0.0–100.0)

## 2019-12-16 MED ORDER — LORAZEPAM 2 MG/ML IJ SOLN
0.5000 mg | Freq: Once | INTRAMUSCULAR | Status: AC
  Administered 2019-12-16: 0.5 mg via INTRAVENOUS
  Filled 2019-12-16: qty 1

## 2019-12-16 MED ORDER — FUROSEMIDE 10 MG/ML IJ SOLN
20.0000 mg | Freq: Two times a day (BID) | INTRAMUSCULAR | Status: AC
  Administered 2019-12-16 – 2019-12-18 (×4): 20 mg via INTRAVENOUS
  Filled 2019-12-16 (×4): qty 2

## 2019-12-16 MED ORDER — FUROSEMIDE 40 MG PO TABS
40.0000 mg | ORAL_TABLET | Freq: Every day | ORAL | Status: DC
  Administered 2019-12-16: 40 mg via ORAL
  Filled 2019-12-16: qty 1

## 2019-12-16 NOTE — Progress Notes (Signed)
OT Cancellation Note  Patient Details Name: Connie Poole MRN: [id number] DOB: 10/14/1920   Cancelled Treatment:    Reason Eval/Treat Not Completed: Fatigue/lethargy limiting ability to participate   Pt resting after being agitated.  Daughter present and requested pt rest.  Lise Auer, OT Acute Rehabilitation Services [phone] Office- [phone]     Onesimo Lingard, [place] 12/16/2019, 2:37 PM

## 2019-12-16 NOTE — Progress Notes (Signed)
Pt agitated. Family sitter at bedside. Pt still pulling IV, tele lines. Cannot redirect. On call notified about agitation.

## 2019-12-16 NOTE — Progress Notes (Signed)
PROGRESS NOTE    Connie Poole  BTD:[id number] DOB: [birth year] DOA: 12/13/2019 PCP: Dorothyann Peng, MD    Chief Complaint  Patient presents with  . Shortness of Breath  . Cough    Brief Narrative:  HPI per Dr. Leafy Half 86 year old female with past medical history of diabetes mellitus type 2, hypertension, hyperlipidemia, Lewy body dementia, diastolic ingestive heart failure and pulmonary sarcoidosis who presents to [facility] long hospital as a transfer from med [facility] emergency department for suspected right upper lobe pneumonia.  Due to patient's advanced dementia she is an extremely poor historian and the majority of the history is been obtained from the daughter who is at the bedside.  According to the daughter, patient has exhibited a 1 day history of worsening cough compared to her baseline cough productive of significant amounts of yellow sputum, different from her scant amounts of white sputum at baseline.  Patient has also exhibited shortness of breath with past 24 hours.  In addition this patient exhibits generalized weakness, fatigue and poor appetite.  Daughter denies any recent travel, sick contacts or confirmed contacts with COVID-19.  Due to patient's progressively worsening symptoms the patient was brought into med [facility] emergency department for evaluation.  Upon evaluation in the emergency department patient was found to be hypoxic at 86% on room air on arrival.  Exhibit evidence of leukocytosis with a developing right upper lobe infiltrate on chest x-ray.  Due to concerns for developing right upper lobe pneumonia in this patient with hypoxia, the hospitalist group was called to assess the patient for admission the hospital.  Assessment & Plan:   Principal Problem:   Acute respiratory failure with hypoxia (HCC) Active Problems:   Pneumonia of right upper lobe due to infectious organism   Lewy body dementia without behavioral disturbance (HCC)   Type 2  diabetes mellitus with stage 3 chronic kidney disease, without long-term current use of insulin (HCC)   Chronic kidney disease, stage 3b   Chronic diastolic CHF (congestive heart failure) (HCC)   Dehydration with hyponatremia   Community acquired pneumonia of right upper lobe of lung   Constipation   Acute on chronic diastolic CHF (congestive heart failure) (HCC)  1 acute respiratory failure with hypoxia secondary to aspiration pneumonia versus CAP Patient had presented with a 1 day history of progressive worsening lethargy, weakness, decreased appetite, shortness of breath and a productive cough of yellowish sputum.  Chest x-ray done concerning for right upper lobe infiltrate.  19 PCR negative.  Patient pancultured results pending.  Patient afebrile over the past 24 hours.  Currently on 2 L nasal cannula.  Urine strep pneumococcus antigen negative.  Concern for component of volume overload.  Continue empiric IV azithromycin, IV Rocephin next, Flonase, Claritin, scheduled duo nebs.  SLP pending.  Continue current dysphagia 1 diet.  Will place on IV Lasix.  Follow.  2.  Lewy body dementia without behavioral disturbance Patient with advanced Lewy body dementia at baseline.  Daughter at bedside.  Patient presented with acute respiratory distress with hypoxia and probable aspiration pneumonia.  SLP evaluation pending.  Patient noted to be following by palliative care in the outpatient setting.  Concern that patient may be having declining worsening dementia and as such palliative care consulted and patient seen in consultation.  Patient currently made DNR.  Continue Aricept and Zoloft.  Outpatient follow-up with palliative care on discharge.  3.  Chronic kidney disease stage IIIb Stable.  4.  Well-controlled diabetes mellitus  type 2 Hemoglobin A1c 6.4.  CBG 100 this morning.  Likely low blood glucose levels secondary to poor oral intake.  [facility]  Discontinue sliding scale  insulin.  Continue to follow CBGs.  5.  Acute on chronic diastolic CHF 2D echo from 2019 showed a preserved EF of 65 to 70% with diastolic dysfunction.  Concern for probable volume overload.  Check a chest x-ray.  Check a BNP.  Continue home regimen Lasix.  We will give Lasix 20 mg IV every 12 hours x2 days.  Strict I's and O's.  Daily weights.  Follow.  6.  Dehydration with hyponatremia Improved with hydration.  Saline lock IV fluids.  Follow.  7.  Constipation Status post large bowel movement 12/15/2019.  Dulcolax suppository as needed.   DVT prophylaxis: Lovenox Code Status: DNR Family Communication: Updated daughter at bedside. Disposition:   Status is: Inpatient    Dispo: The patient is from: Home              Anticipated d/c is to: Likely back home with palliative care following once clinically improved..              Anticipated d/c date is: Hopefully in 2 to 3 days once patient is afebrile, on oral antibiotics, improving clinically.              Patient currently on IV antibiotics, palliative care consulted, home once afebrile for 48 hours, improving clinically, and off oxygen.        Consultants:   Palliative care: Dr. Linna DarnerAnwar 12/15/2019  Procedures:  Chest x-ray 12/13/2019, 12/14/2019, 12/16/2019  Antimicrobials:   IV azithromycin 12/13/2019  IV Rocephin 12/13/2019   Subjective: Patient laying in bed however arousable.  On nasal cannula with sats of 97% on 2 L nasal cannula.  Denies chest pain.  Pleasantly confused.  Afebrile.    Objective: Vitals:   12/16/19 0626 12/16/19 0813 12/16/19 0836 12/16/19 1500  BP:    (!) 127/92  Pulse: 80   70  Resp:    18  Temp:    98.9 F (37.2 C)  TempSrc:    Oral  SpO2: 93%  97% 98%  Weight:  84 kg    Height:        Intake/Output Summary (Last 24 hours) at 12/16/2019 1716 Last data filed at 12/16/2019 0500 Gross per 24 hour  Intake 452.37 ml  Output 150 ml  Net 302.37 ml   Filed Weights   12/13/19 1838 12/16/19 0813    Weight: 76.1 kg 84 kg    Examination:  General exam: NAD Respiratory system: Diffuse crackles/coarse breath sounds.  Normal respiratory effort.  Cardiovascular system: RRR no murmurs rubs or gallops.  No JVD.  No lower extremity edema.  Gastrointestinal system: Abdomen is nontender, nondistended, soft, positive bowel sounds.  No rebound.  No guarding.  Central nervous system: Alert and oriented. No focal neurological deficits. Extremities: Symmetric 5 x 5 power. Skin: No rashes, lesions or ulcers Psychiatry: Judgement and insight appear normal. Mood & affect appropriate.     Data Reviewed: I have personally reviewed following labs and imaging studies  CBC: Recent Labs  Lab 12/13/19 1901 12/14/19 0931 12/15/19 0650 12/16/19 0520  WBC 12.7* 8.5 7.5 8.6  NEUTROABS 9.2* 5.5 4.2  --   HGB 9.3* 8.2* 8.0* 9.6*  HCT 29.8* 27.1* 27.5* 32.7*  MCV 89.2 92.2 95.2 94.2  PLT 360 312 338 409*    Basic Metabolic Panel: Recent  Labs  Lab 12/13/19 1901 12/14/19 0804 12/15/19 0650 12/16/19 0520  NA 131* 135 139 141  K 4.4 4.2 4.4 4.0  CL 97* 102 106 106  CO2 23 25 25 24   GLUCOSE 183* 115* 69* 105*  BUN 58* 49* 37* 28*  CREATININE 1.43* 1.31* 1.29* 1.13*  CALCIUM 9.0 8.7* 8.8* 9.2  MG  --  2.1  --   --     GFR: Estimated Creatinine Clearance: 28 mL/min (A) (by C-G formula based on SCr of 1.13 mg/dL (H)).  Liver Function Tests: Recent Labs  Lab 12/13/19 1901  AST 22  ALT 30  ALKPHOS 64  BILITOT 0.6  PROT 7.8  ALBUMIN 3.4*    CBG: Recent Labs  Lab 12/15/19 1705 12/15/19 2131 12/16/19 0731 12/16/19 1145 12/16/19 1706  GLUCAP 86 97 100* 158* 154*     Recent Results (from the past 240 hour(s))  Blood Culture (routine x 2)     Status: None (Preliminary result)   Collection Time: 12/13/19  7:38 PM   Specimen: BLOOD  Result Value Ref Range Status   Specimen Description   Final    BLOOD LEFT ANTECUBITAL Performed at [facility], [address]  [place], [place]    Special Requests   Final    BOTTLES DRAWN AEROBIC AND ANAEROBIC Blood Culture adequate volume Performed at [facility], [address] [place], [place]    Culture   Final    NO GROWTH 3 DAYS Performed at [facility], [address], [place], [place]    Report Status PENDING  Incomplete  Blood Culture (routine x 2)     Status: None (Preliminary result)   Collection Time: 12/13/19  7:50 PM   Specimen: BLOOD  Result Value Ref Range Status   Specimen Description   Final    BLOOD RIGHT ANTECUBITAL Performed at [facility], [address] [place], [place]    Special Requests   Final    BOTTLES DRAWN AEROBIC AND ANAEROBIC Blood Culture adequate volume Performed at [facility], [address] [place], [place]    Culture   Final    NO GROWTH 3 DAYS Performed at [facility], [address], [place], [place]    Report Status PENDING  Incomplete  Respiratory Panel by RT PCR (Flu A&B, Covid) - Nasopharyngeal Swab     Status: None   Collection Time: 12/13/19  9:09 PM   Specimen: Nasopharyngeal Swab  Result Value Ref Range Status   SARS Coronavirus 2 by RT PCR NEGATIVE NEGATIVE Final    Comment: (NOTE) SARS-CoV-2 target nucleic acids are NOT DETECTED. The SARS-CoV-2 RNA is generally detectable in upper respiratoy specimens during the acute phase of infection. The lowest concentration of SARS-CoV-2 viral copies this assay can detect is 131 copies/mL. A negative result does not preclude SARS-Cov-2 infection and should not be used as the sole basis for treatment or other patient management decisions. A negative result may occur with  improper specimen collection/handling, submission of specimen other than nasopharyngeal swab, presence of viral mutation(s) within the areas targeted by this assay, and inadequate number of viral copies (<131 copies/mL). A negative  result must be combined with clinical observations, patient history, and epidemiological information. The expected result is Negative. Fact Sheet for Patients:  12/15/19 Fact Sheet for Healthcare Providers:  https://www.moore.com/ This test is not yet  ap proved or cleared by the [place] and  has been authorized for detection and/or diagnosis of SARS-CoV-2 by FDA under an Emergency Use Authorization (EUA). This EUA will remain  in effect (meaning this test can be used) for the duration of the COVID-19 declaration under Section 564(b)(1) of the Act, 21 U.S.C. section 360bbb-3(b)(1), unless the authorization is terminated or revoked sooner.    Influenza A by PCR NEGATIVE NEGATIVE Final   Influenza B by PCR NEGATIVE NEGATIVE Final    Comment: (NOTE) The Xpert Xpress SARS-CoV-2/FLU/RSV assay is intended as an aid in  the diagnosis of influenza from Nasopharyngeal swab specimens and  should not be used as a sole basis for treatment. Nasal washings and  aspirates are unacceptable for Xpert Xpress SARS-CoV-2/FLU/RSV  testing. Fact Sheet for Patients: https://www.moore.com/ Fact Sheet for Healthcare Providers: https://www.young.biz/ This test is not yet approved or cleared by the [place] FDA and  has been authorized for detection and/or diagnosis of SARS-CoV-2 by  FDA under an Emergency Use Authorization (EUA). This EUA will remain  in effect (meaning this test can be used) for the duration of the  Covid-19 declaration under Section 564(b)(1) of the Act, 21  U.S.C. section 360bbb-3(b)(1), unless the authorization is  terminated or revoked. Performed at [facility], [address] [place], [place] [address]   Expectorated sputum assessment w rflx to resp cult     Status: None   Collection Time: 12/14/19  1:16 AM   Specimen: Expectorated Sputum  Result Value Ref Range  Status   Specimen Description EXPECTORATED SPUTUM  Final   Special Requests NONE  Final   Sputum evaluation   Final    THIS SPECIMEN IS ACCEPTABLE FOR SPUTUM CULTURE Performed at [facility], [address], [place], [place] [address]    Report Status 12/14/2019 FINAL  Final  Culture, respiratory     Status: None   Collection Time: 12/14/19  1:16 AM  Result Value Ref Range Status   Specimen Description   Final    EXPECTORATED SPUTUM Performed at [facility], [address], [place], [place] [address]    Special Requests   Final    NONE Reflexed from [id number] Performed at [facility], [address] [place], [place] [address]    Gram Stain   Final    RARE WBC PRESENT, PREDOMINANTLY PMN FEW GRAM POSITIVE COCCI IN PAIRS RARE GRAM POSITIVE RODS    Culture   Final    FEW Consistent with normal respiratory flora. Performed at [facility], [address], [place], [place] [address]    Report Status 12/16/2019 FINAL  Final  Culture, Urine     Status: Abnormal   Collection Time: 12/14/19  7:44 AM   Specimen: Urine, Catheterized  Result Value Ref Range Status   Specimen Description   Final    URINE, CATHETERIZED Performed at [facility], [address] [place], [place] [address]    Special Requests   Final    NONE Performed at [facility], [address] [place], [place] [address]    Culture (A)  Final    <10,000 COLONIES/mL INSIGNIFICANT GROWTH Performed at [facility], [address], [place], [place] [address]    Report Status 12/16/2019 FINAL  Final         Radiology Studies: DG CHEST PORT 1 VIEW  Result Date: 12/16/2019 CLINICAL DATA:  Worsening  cough. Shortness of breath. EXAM: PORTABLE CHEST 1 VIEW COMPARISON:  Radiograph 2 days ago 12/14/2019 FINDINGS: Mild cardiomegaly is stable. Improving patchy right suprahilar opacity with mild residual.  Subsegmental atelectasis in the left mid lung. Possible small pleural effusions. Patient's chin obscures the apices. No pulmonary edema. No pneumothorax. Advanced degenerative change of the shoulders. IMPRESSION: 1. Improving patchy right suprahilar opacity with mild residual. 2. Subsegmental atelectasis in the left mid lung. 3. Possible small pleural effusions. Electronically Signed   By: Keith Rake M.D.   On: 12/16/2019 15:22        Scheduled Meds: . aspirin EC  81 mg Oral Daily  . atorvastatin  40 mg Oral QHS  . brimonidine  1 drop Both Eyes BID  . carvedilol  25 mg Oral BID  . donepezil  5 mg Oral QHS  . dorzolamide  1 drop Both Eyes BID  . enoxaparin (LOVENOX) injection  30 mg Subcutaneous Q24H  . fluticasone  2 spray Each Nare Daily  . furosemide  20 mg Intravenous Q12H  . furosemide  40 mg Oral Daily  . guaiFENesin  1,200 mg Oral BID  . ipratropium-albuterol  3 mL Nebulization BID  . irbesartan  300 mg Oral Daily  . latanoprost  1 drop Left Eye QHS  . loratadine  10 mg Oral Daily  . melatonin  10 mg Oral QHS  . sertraline  50 mg Oral Daily   Continuous Infusions: . sodium chloride Stopped (12/13/19 2302)  . azithromycin (ZITHROMAX) 500 MG IVPB (Vial-Mate [employer]or) 500 mg (12/15/19 2145)  . cefTRIAXone (ROCEPHIN)  IV Stopped (12/14/19 2110)  . dextrose 5 % and 0.9% NaCl 10 mL/hr at 12/15/19 1507     LOS: 2 days    Time spent: 35 minutes    Irine Seal, MD [facility]   To contact the attending provider between 7A-7P or the covering provider during after hours 7P-7A, please log into the web site [email or web address] and access using universal [place] password for that web site. If you do not have the password, please call the hospital operator.  12/16/2019, 5:16 PM

## 2019-12-16 NOTE — Progress Notes (Signed)

## 2019-12-17 ENCOUNTER — Telehealth: Payer: Self-pay | Admitting: Internal Medicine

## 2019-12-17 DIAGNOSIS — N1832 Chronic kidney disease, stage 3b: Secondary | ICD-10-CM | POA: Diagnosis not present

## 2019-12-17 DIAGNOSIS — J189 Pneumonia, unspecified organism: Secondary | ICD-10-CM | POA: Diagnosis not present

## 2019-12-17 DIAGNOSIS — J9601 Acute respiratory failure with hypoxia: Secondary | ICD-10-CM | POA: Diagnosis not present

## 2019-12-17 DIAGNOSIS — I5032 Chronic diastolic (congestive) heart failure: Secondary | ICD-10-CM | POA: Diagnosis not present

## 2019-12-17 LAB — BASIC METABOLIC PANEL
Anion gap: 8 (ref 5–15)
BUN: 26 mg/dL — ABNORMAL HIGH (ref 8–23)
CO2: 28 mmol/L (ref 22–32)
Calcium: 9 mg/dL (ref 8.9–10.3)
Chloride: 106 mmol/L (ref 98–111)
Creatinine, Ser: 1.28 mg/dL — ABNORMAL HIGH (ref 0.44–1.00)
GFR calc Af Amer: 41 mL/min — ABNORMAL LOW (ref >60)
GFR calc non Af Amer: 35 mL/min — ABNORMAL LOW (ref >60)
Glucose, Bld: 115 mg/dL — ABNORMAL HIGH (ref 70–99)
Potassium: 3.6 mmol/L (ref 3.5–5.1)
Sodium: 142 mmol/L (ref 135–145)

## 2019-12-17 LAB — CBC
HCT: 30.9 % — ABNORMAL LOW (ref 36.0–46.0)
Hemoglobin: 8.7 g/dL — ABNORMAL LOW (ref 12.0–15.0)
MCH: 26.7 pg (ref 26.0–34.0)
MCHC: 28.2 g/dL — ABNORMAL LOW (ref 30.0–36.0)
MCV: 94.8 fL (ref 80.0–100.0)
Platelets: 398 10*3/uL (ref 150–400)
RBC: 3.26 MIL/uL — ABNORMAL LOW (ref 3.87–5.11)
RDW: 13.4 % (ref 11.5–15.5)
WBC: 7.7 10*3/uL (ref 4.0–10.5)
nRBC: 0 % (ref 0.0–0.2)

## 2019-12-17 LAB — BRAIN NATRIURETIC PEPTIDE: B Natriuretic Peptide: 894 pg/mL — ABNORMAL HIGH (ref 0.0–100.0)

## 2019-12-17 LAB — GLUCOSE, CAPILLARY
Glucose-Capillary: 100 mg/dL — ABNORMAL HIGH (ref 70–99)
Glucose-Capillary: 105 mg/dL — ABNORMAL HIGH (ref 70–99)
Glucose-Capillary: 91 mg/dL (ref 70–99)
Glucose-Capillary: 89 mg/dL (ref 70–99)

## 2019-12-17 LAB — LEGIONELLA PNEUMOPHILA SEROGP 1 UR AG: L. pneumophila Serogp 1 Ur Ag: NEGATIVE

## 2019-12-17 MED ORDER — FUROSEMIDE 40 MG PO TABS
40.0000 mg | ORAL_TABLET | Freq: Every day | ORAL | Status: DC
  Administered 2019-12-18: 40 mg via ORAL
  Filled 2019-12-17: qty 1

## 2019-12-17 MED ORDER — POTASSIUM CHLORIDE 20 MEQ PO PACK
20.0000 meq | PACK | Freq: Once | ORAL | Status: AC
  Administered 2019-12-17: 20 meq via ORAL
  Filled 2019-12-17: qty 1

## 2019-12-17 MED ORDER — LORAZEPAM 2 MG/ML IJ SOLN
1.0000 mg | Freq: Once | INTRAMUSCULAR | Status: AC
  Administered 2019-12-18: 1 mg via INTRAVENOUS
  Filled 2019-12-17: qty 1

## 2019-12-17 NOTE — TOC Initial Note (Signed)
Transition of Care (TOC) - Initial/Assessment Note    Patient Details  Name: Connie Poole MRN: [id number] Date of Birth: 03/21/1921  Transition of Care (TOC) CM/SW Contact:     B , LCSW Phone Number: 12/17/2019, 2:29 PM  Clinical Narrative:   Met with patient in response to PT recommendations.  Ms Clevinger was lethargic and has dementia, so I was unable to engage her, but her daughter was in the room.  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  Currently, they are receiving support from [facility], as well as [facility].  Plan is for patient to return home at d/c.  All needed DME is already in place.  No further needs identified.  TOC will continue to follow during the course of hospitalization.                   Barriers to Discharge: No Barriers Identified   Patient Goals and CMS Choice Patient states their goals for this hospitalization and ongoing recovery are:: Unable to say      Expected Discharge Plan and Services       Post Acute Care Choice: Home Health, Durable Medical Equipment Living arrangements for the past 2 months: Single Family Home                             HH Agency: [facility]        Prior Living Arrangements/Services Living arrangements for the past 2 months: Single Family Home Lives with:: Adult Children Patient language and need for interpreter reviewed:: Yes Do you feel safe going back to the place where you live?: Yes      Need for Family Participation in Patient Care: Yes (Comment) Care giver support system in place?: Yes (comment) Current home services: DME, Homehealth aide, Home PT Criminal Activity/Legal Involvement Pertinent to Current  Situation/Hospitalization: No - Comment as needed  Activities of Daily Living   ADL Screening (condition at time of admission) Patient's cognitive ability adequate to safely complete daily activities?: No Is the patient deaf or have difficulty hearing?: No Does the patient have difficulty seeing, even when wearing glasses/contacts?: No Does the patient have difficulty concentrating, remembering, or making decisions?: Yes Patient able to express need for assistance with ADLs?: No Does the patient have difficulty dressing or bathing?: Yes Independently performs ADLs?: No Communication: Independent Dressing (OT): Needs assistance Is this a change from baseline?: Pre-admission baseline Grooming: Needs assistance Is this a change from baseline?: Pre-admission baseline Feeding: Needs assistance Is this a change from baseline?: Pre-admission baseline Bathing: Needs assistance Is this a change from baseline?: Pre-admission baseline Toileting: Needs assistance Is this a change from baseline?: Pre-admission baseline In/Out Bed: Dependent Is this a change from baseline?: Pre-admission baseline Walks in Home: Dependent Is this a change from baseline?: Pre-admission baseline Does the patient have difficulty walking or climbing stairs?: Yes Weakness of Legs: None Weakness of Arms/Hands: None  Permission Sought/Granted Permission sought to share information with : Family Supports Permission granted to share information with : Yes, Verbal Permission Granted  Share Information with NAME: Ms Wallington     Permission granted to   share info w Relationship: daughter  Permission granted to share info w Contact Information: [phone]  Emotional Assessment Appearance:: Appears stated age Attitude/Demeanor/Rapport: Unable to Assess Affect (typically observed): Unable to Assess Orientation: : Oriented to Self Alcohol / Substance Use: Not Applicable Psych Involvement: No (comment)  Admission  diagnosis:  Pneumonia of right upper lobe due to infectious organism [J18.9] Patient Active Problem List   Diagnosis Date Noted  . Acute on chronic diastolic CHF (congestive heart failure) ([place])   . Community acquired pneumonia of right upper lobe of lung   . Constipation   . Acute respiratory failure with hypoxia ([place]) 12/14/2019  . Chronic kidney disease, stage 3b 12/14/2019  . Chronic diastolic CHF (congestive heart failure) ([place]) 12/14/2019  . Dehydration with hyponatremia 12/14/2019  . Pneumonia of right upper lobe due to infectious organism 12/13/2019  . Type 2 diabetes mellitus with stage 3 chronic kidney disease, without long-term current use of insulin ([place]) 01/29/2019  . Decreased mobility 12/25/2018  . Allergic rhinitis 12/25/2018  . Dysuria 12/25/2018  . Debility 12/25/2018  . Hypertensive nephropathy 04/26/2018  . Cutaneous abscess of face 04/26/2018  . Wheezing 04/26/2018  . Confusion 04/26/2018  . 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 Formed visual hallucinations 11/18/2015  . Lewy body dementia without behavioral disturbance ([place]) 11/18/2015  . Irreducible umbilical hernia 00/34/9179   PCP:  Glendale Chard, MD Pharmacy:   [id number]: [phone]: [phone]    Social Determinants of Health (SDOH) Interventions    Readmission Risk Interventions No flowsheet data found.

## 2019-12-17 NOTE — Evaluation (Signed)
Occupational Therapy Evaluation Patient Details Name: Connie Poole MRN: [id number] DOB: [birth year] Today's Date: 12/17/2019    History of Present Illness 86yo female presenting as a transfer from [facility] due to suspicion of R upper lobe pneumonia. Hypoxic in the ED at 86% on room air and CXR with R upper lobe infiltration. Covid negative. PMH CHF, advanced lewy body dementia, DM, HLD, HTN, sarcoidosis, TKA   Clinical Impression   Eval limited due to pt fatigue. Pt in bed with daughter present. Pt able to answer some questions and follow 50% commands. Attempted rolling in bed max A. Bed level grooming and UB ADLs, min A - mod A with verbal and physical cues. Pt lives with her daughter and son in law and has caregivers/PCA assist ad overnight assist 3 nights/wk, Pt was walking with a RW with HH PT. Pt would benefit from acute OT services to address impairments to maximize level of function and safety    Follow Up Recommendations  Home health OT;Supervision/Assistance - 24 hour(family refusing SNF)    Equipment Recommendations  None recommended by OT;Other (comment) has hosp bed, BSC, WC, RW   Recommendations for Other Services       Precautions / Restrictions Precautions Precautions: Fall Precaution Comments: Nectar Thicken Liquids, puree diet Restrictions Weight Bearing Restrictions: No      Mobility Bed Mobility Overal bed mobility: Needs Assistance Bed Mobility: Rolling Rolling: Max assist         General bed mobility comments: unable to sit from supine  Transfers                 General transfer comment: unable, requires max - total A per PT note    Balance                                           ADL either performed or assessed with clinical judgement   ADL Overall ADL's : Needs assistance/impaired Eating/Feeding: Minimal assistance;Sitting;Bed level   Grooming: Wash/dry face;Wash/dry hands;Minimal assistance;Sitting;Bed  level Grooming Details (indicate cue type and reason): hand over hand assist to initiate Upper Body Bathing: Moderate assistance;Sitting;Bed level   Lower Body Bathing: Total assistance   Upper Body Dressing : Moderate assistance   Lower Body Dressing: Total assistance       Toileting- Clothing Manipulation and Hygiene: Total assistance;Bed level         General ADL Comments: will need max - total A + 2 assist for transfers     Vision Patient Visual Report: No change from baseline       Perception     Praxis      Pertinent Vitals/Pain Pain Assessment: No/denies pain Faces Pain Scale: No hurt Pain Intervention(s): Monitored during session     Hand Dominance Right   Extremity/Trunk Assessment Upper Extremity Assessment Upper Extremity Assessment: Generalized weakness   Lower Extremity Assessment Lower Extremity Assessment: Defer to PT evaluation   Cervical / Trunk Assessment Cervical / Trunk Assessment: Kyphotic   Communication Communication Communication: Receptive difficulties;Expressive difficulties   Cognition Arousal/Alertness: Awake/alert Behavior During Therapy: Agitated Overall Cognitive Status: History of cognitive impairments - at baseline                                     General Comments  Exercises     Shoulder Instructions      Home Living Family/patient expects to be discharged to:: Private residence Living Arrangements: Children Available Help at Discharge: Family;Available 24 hours/day;Personal care attendant Type of Home: House Home Access: Level entry     Home Layout: Multi-level;Able to live on main level with bedroom/bathroom Alternate Level Stairs-Number of Steps: flight   Bathroom Shower/Tub: Tub/shower unit         Home Equipment: Psychologist, educational - 2 wheels;Bedside commode   Additional Comments: daughter is primary caregiver and has PCAs coming to help throughout the week (3 aides at  various schedules of 15-18 hours, has overnight help 3 nights a week)      Prior Functioning/Environment Level of Independence: Needs assistance  Gait / Transfers Assistance Needed: only ambulates with HHPT and RW; can transfers with family but per daughter "I'd rate her transfers at a C" and can be a heavy lift ADL's / Homemaking Assistance Needed: max assist            OT Problem List: Decreased strength;Decreased activity tolerance;Decreased coordination;Decreased knowledge of use of DME or AE;Decreased safety awareness      OT Treatment/Interventions: Self-care/ADL training;Therapeutic exercise;Therapeutic activities;DME and/or AE instruction    OT Goals(Current goals can be found in the care plan section) Acute Rehab OT Goals Patient Stated Goal: return home OT Goal Formulation: With patient/family Time For Goal Achievement: 12/31/19 Potential to Achieve Goals: Good ADL Goals Pt Will Perform Eating: with supervision;with set-up;sitting;with caregiver independent in assisting Pt Will Perform Grooming: with min guard assist;with supervision;with set-up;sitting;with caregiver independent in assisting Pt Will Perform Upper Body Bathing: with min assist;with min guard assist;sitting;with caregiver independent in assisting Pt Will Perform Upper Body Dressing: with min assist;with min guard assist;sitting;with caregiver independent in assisting Pt Will Transfer to Toilet: with mod assist;with +2 assist;stand pivot transfer;bedside commode  OT Frequency: Min 2X/week   Barriers to D/C:            Co-evaluation              AM-PAC OT "6 Clicks" Daily Activity     Outcome Measure Help from another person eating meals?: A Little Help from another person taking care of personal grooming?: A Little Help from another person toileting, which includes using toliet, bedpan, or urinal?: Total Help from another person bathing (including washing, rinsing, drying)?: Total Help from  another person to put on and taking off regular upper body clothing?: A Lot Help from another person to put on and taking off regular lower body clothing?: Total 6 Click Score: 11   End of Session    Activity Tolerance: Patient limited by fatigue Patient left: in bed;with call bell/phone within reach;with bed alarm set;with family/visitor present  OT Visit Diagnosis: Other abnormalities of gait and mobility (R26.89);Muscle weakness (generalized) (M62.81);Other symptoms and signs involving cognitive function                Time: 1553-1610 OT Time Calculation (min): 17 min Charges:  OT General Charges $OT Visit: 1 Visit OT Evaluation $OT Eval Moderate Complexity: 1 Mod    Galen Manila 12/17/2019, 4:40 PM

## 2019-12-17 NOTE — Progress Notes (Signed)
Patient at 95% SpO2 on RA, 100% on 2L Nasal Cannula

## 2019-12-17 NOTE — Telephone Encounter (Signed)
10:50 am: Telephone call to [name], who requested I call so she could update me with her mother's hospital progress. Dois Davenport will call me when her mother is home from hospital and will set up a schedule a follow up palliative care visits.  Holly Bodily NP-C [phone]

## 2019-12-17 NOTE — Progress Notes (Signed)
PROGRESS NOTE    Connie Poole  AJO:[id number] DOB: 1921/01/24 DOA: 12/13/2019 PCP: Dorothyann Peng, MD    Chief Complaint  Patient presents with  . Shortness of Breath  . Cough    Brief Narrative:  HPI per Dr. Leafy Half 86 year old female with past medical history of diabetes mellitus type 2, hypertension, hyperlipidemia, Lewy body dementia, diastolic ingestive heart failure and pulmonary sarcoidosis who presents to [facility] long hospital as a transfer from med [facility] emergency department for suspected right upper lobe pneumonia.  Due to patient's advanced dementia she is an extremely poor historian and the majority of the history is been obtained from the daughter who is at the bedside.  According to the daughter, patient has exhibited a 1 day history of worsening cough compared to her baseline cough productive of significant amounts of yellow sputum, different from her scant amounts of white sputum at baseline.  Patient has also exhibited shortness of breath with past 24 hours.  In addition this patient exhibits generalized weakness, fatigue and poor appetite.  Daughter denies any recent travel, sick contacts or confirmed contacts with COVID-19.  Due to patient's progressively worsening symptoms the patient was brought into med [facility] emergency department for evaluation.  Upon evaluation in the emergency department patient was found to be hypoxic at 86% on room air on arrival.  Exhibit evidence of leukocytosis with a developing right upper lobe infiltrate on chest x-ray.  Due to concerns for developing right upper lobe pneumonia in this patient with hypoxia, the hospitalist group was called to assess the patient for admission the hospital.  Assessment & Plan:   Principal Problem:   Acute respiratory failure with hypoxia (HCC) Active Problems:   Pneumonia of right upper lobe due to infectious organism   Lewy body dementia without behavioral disturbance (HCC)   Type 2  diabetes mellitus with stage 3 chronic kidney disease, without long-term current use of insulin (HCC)   Chronic kidney disease, stage 3b   Chronic diastolic CHF (congestive heart failure) (HCC)   Dehydration with hyponatremia   Community acquired pneumonia of right upper lobe of lung   Constipation   Acute on chronic diastolic CHF (congestive heart failure) (HCC)  1 acute respiratory failure with hypoxia secondary to aspiration pneumonia versus CAP Patient had presented with a 1 day history of progressive worsening lethargy, weakness, decreased appetite, shortness of breath and a productive cough of yellowish sputum.  Chest x-ray done concerning for right upper lobe infiltrate.  19 PCR negative.  Patient pancultured results pending with no growth to date.  Patient afebrile over the past 24 hours.  Currently on 2 L nasal cannula.  Urine strep pneumococcus antigen negative.  Concern for component of volume overload and assess patient given dose of IV Lasix.  Patient with urine output of 1.4 L over the past 24 hours.  Patient is -2.452 L.  Improving clinically..  Continue empiric IV azithromycin, IV Rocephin, Flonase, Claritin, scheduled duo nebs.  Continue current dysphagia 1 diet.  Follow.  2.  Lewy body dementia without behavioral disturbance Patient with advanced Lewy body dementia at baseline.  Daughter at bedside.  Patient presented with acute respiratory distress with hypoxia and probable aspiration pneumonia.  SLP has evaluated patient and agree with current diet of dysphagia 1 diet with nectar thickened liquids.  Patient noted to be following by palliative care in the outpatient setting.  Concern that patient may be having declining worsening dementia and as such palliative care consulted  and patient seen in consultation.  Patient currently made DNR.  Continue Aricept and Zoloft.  Outpatient follow-up with palliative care on discharge.  3.  Chronic kidney disease stage IIIb Stable.  4.   Well-controlled diabetes mellitus type 2 Hemoglobin A1c 6.4.  CBG 100 this morning.  Likely low blood glucose levels secondary to poor oral intake.  D5 normal saline has been discontinued.  Sliding scale discontinued.  Daughter at bedside feeding patient to ensure adequate O2 intake.  Follow CBGs.    5.  Acute on chronic diastolic CHF 2D echo from 2019 showed a preserved EF of 65 to 70% with diastolic dysfunction.  Concern for probable volume overload.  Chest x-ray done showed improving patchy right suprahilar opacity with mild residual, subsegmental atelectasis in the left midlung, possible small pleural effusions.  Noted to be elevated.  Patient placed on Lasix 20 mg IV every 12 hours x4 doses.  Patient with a urine output of 1.4 L over the past 24 hours.  Patient is -2.452 L during this hospitalization.  Discontinue IV Lasix after today's doses and transition back to home dose oral Lasix 40 mg daily.  Strict I's and O's.  Daily weights.   6.  Dehydration with hyponatremia Improved with hydration.  IV fluids have been saline lock.  Follow.    7.  Constipation Status post large bowel movement 12/15/2019.  Dulcolax suppository as needed.   DVT prophylaxis: Lovenox Code Status: DNR Family Communication: Updated daughter at bedside. Disposition:   Status is: Inpatient    Dispo: The patient is from: Home              Anticipated d/c is to: Likely back home with palliative care following once clinically improved..              Anticipated d/c date is: Hopefully tomorrow if continued improvement with resolution of hypoxia.                Patient currently on IV antibiotics, palliative care consulted, home once afebrile for 48 hours, improving clinically, and off oxygen.        Consultants:   Palliative care: Dr. Linna Darner 12/15/2019  Procedures:  Chest x-ray 12/13/2019, 12/14/2019, 12/16/2019  Antimicrobials:   IV azithromycin 12/13/2019  IV Rocephin 12/13/2019   Subjective: Patient  laying in bed, daughter at bedside feeding patient lunch.  Patient denies any chest pain.  Pleasantly confused.    Objective: Vitals:   12/16/19 2004 12/17/19 0443 12/17/19 0636 12/17/19 0806  BP:   (!) 155/81   Pulse:   65   Resp:   (!) 21   Temp:   98.3 F (36.8 C)   TempSrc:   Oral   SpO2: 96%  92% 95%  Weight:  78.9 kg    Height:        Intake/Output Summary (Last 24 hours) at 12/17/2019 1259 Last data filed at 12/17/2019 0600 Gross per 24 hour  Intake 125 ml  Output 1400 ml  Net -1275 ml   Filed Weights   12/16/19 0813 12/16/19 1229 12/17/19 0443  Weight: 84 kg 80.3 kg 78.9 kg    Examination:  General exam: NAD Respiratory system: Some decreased breath sounds in the bases.  Fair air movement.  Decreased coarse breath sounds/crackles. Cardiovascular system: Regular rate rhythm no murmurs rubs or gallops.  No JVD.  No lower extremity edema.   Gastrointestinal system: Abdomen is soft, nontender, nondistended, positive bowel sounds.  No rebound.  No guarding.  Central  nervous system: Alert. No focal neurological deficits. Extremities: Symmetric 5 x 5 power. Skin: No rashes, lesions or ulcers Psychiatry: Judgement and insight appear poor to fair. Mood & affect appropriate.     Data Reviewed: I have personally reviewed following labs and imaging studies  CBC: Recent Labs  Lab 12/13/19 1901 12/14/19 0931 12/15/19 0650 12/16/19 0520 12/17/19 0541  WBC 12.7* 8.5 7.5 8.6 7.7  NEUTROABS 9.2* 5.5 4.2  --   --   HGB 9.3* 8.2* 8.0* 9.6* 8.7*  HCT 29.8* 27.1* 27.5* 32.7* 30.9*  MCV 89.2 92.2 95.2 94.2 94.8  PLT 360 312 338 409* 398    Basic Metabolic Panel: Recent Labs  Lab 12/13/19 1901 12/14/19 0804 12/15/19 0650 12/16/19 0520 12/17/19 0541  NA 131* 135 139 141 142  K 4.4 4.2 4.4 4.0 3.6  CL 97* 102 106 106 106  CO2 23 25 25 24 28   GLUCOSE 183* 115* 69* 105* 115*  BUN 58* 49* 37* 28* 26*  CREATININE 1.43* 1.31* 1.29* 1.13* 1.28*  CALCIUM 9.0 8.7* 8.8* 9.2  9.0  MG  --  2.1  --   --   --     GFR: Estimated Creatinine Clearance: 23.9 mL/min (A) (by C-G formula based on SCr of 1.28 mg/dL (H)).  Liver Function Tests: Recent Labs  Lab 12/13/19 1901  AST 22  ALT 30  ALKPHOS 64  BILITOT 0.6  PROT 7.8  ALBUMIN 3.4*    CBG: Recent Labs  Lab 12/16/19 1145 12/16/19 1706 12/16/19 2137 12/17/19 0747 12/17/19 1155  GLUCAP 158* 154* 116* 100* 105*     Recent Results (from the past 240 hour(s))  Blood Culture (routine x 2)     Status: None (Preliminary result)   Collection Time: 12/13/19  7:38 PM   Specimen: BLOOD  Result Value Ref Range Status   Specimen Description   Final    BLOOD LEFT ANTECUBITAL Performed at [facility] [place], [address] [place], [place] [address]    Special Requests   Final    BOTTLES DRAWN AEROBIC AND ANAEROBIC Blood Culture adequate volume Performed at [facility] [place], [address] [place], [place] [address]    Culture   Final    NO GROWTH 4 DAYS Performed at [facility], [address] [place], [place] [address]    Report Status PENDING  Incomplete  Blood Culture (routine x 2)     Status: None (Preliminary result)   Collection Time: 12/13/19  7:50 PM   Specimen: BLOOD  Result Value Ref Range Status   Specimen Description   Final    BLOOD RIGHT ANTECUBITAL Performed at [facility] [place], [address] [place], [place] [address]    Special Requests   Final    BOTTLES DRAWN AEROBIC AND ANAEROBIC Blood Culture adequate volume Performed at [facility] [place], [address] [place], [place] [address]    Culture   Final    NO GROWTH 4 DAYS Performed at [facility], [address] [place], [place] [address]    Report Status PENDING  Incomplete  Respiratory Panel by RT PCR (Flu A&B, Covid) - Nasopharyngeal Swab     Status: None   Collection Time: 12/13/19  9:09 PM   Specimen: Nasopharyngeal Swab  Result Value Ref Range Status   SARS Coronavirus 2  by RT PCR NEGATIVE NEGATIVE Final    Comment: (NOTE) SARS-CoV-2 target nucleic acids are NOT  DETECTED. The SARS-CoV-2 RNA is generally detectable in upper respiratoy specimens during the acute phase of infection. The lowest concentration of SARS-CoV-2 viral copies this assay can detect is 131 copies/mL. A negative result does not preclude SARS-Cov-2 infection and should not be used as the sole basis for treatment or other patient management decisions. A negative result may occur with  improper specimen collection/handling, submission of specimen other than nasopharyngeal swab, presence of viral mutation(s) within the areas targeted by this assay, and inadequate number of viral copies (<131 copies/mL). A negative result must be combined with clinical observations, patient history, and epidemiological information. The expected result is Negative. Fact Sheet for Patients:  PinkCheek.be Fact Sheet for Healthcare Providers:  GravelBags.it This test is not yet ap proved or cleared by the [place] FDA and  has been authorized for detection and/or diagnosis of SARS-CoV-2 by FDA under an Emergency Use Authorization (EUA). This EUA will remain  in effect (meaning this test can be used) for the duration of the COVID-19 declaration under Section 564(b)(1) of the Act, 21 U.S.C. section 360bbb-3(b)(1), unless the authorization is terminated or revoked sooner.    Influenza A by PCR NEGATIVE NEGATIVE Final   Influenza B by PCR NEGATIVE NEGATIVE Final    Comment: (NOTE) The Xpert Xpress SARS-CoV-2/FLU/RSV assay is intended as an aid in  the diagnosis of influenza from Nasopharyngeal swab specimens and  should not be used as a sole basis for treatment. Nasal washings and  aspirates are unacceptable for Xpert Xpress SARS-CoV-2/FLU/RSV  testing. Fact Sheet for Patients: PinkCheek.be Fact Sheet for Healthcare  Providers: GravelBags.it This test is not yet approved or cleared by the [place] FDA and  has been authorized for detection and/or diagnosis of SARS-CoV-2 by  FDA under an Emergency Use Authorization (EUA). This EUA will remain  in effect (meaning this test can be used) for the duration of the  Covid-19 declaration under Section 564(b)(1) of the Act, 21  U.S.C. section 360bbb-3(b)(1), unless the authorization is  terminated or revoked. Performed at [facility], [place] [place], [place] [address]   Expectorated sputum assessment w rflx to resp cult     Status: None   Collection Time: 12/14/19  1:16 AM   Specimen: Expectorated Sputum  Result Value Ref Range Status   Specimen Description EXPECTORATED SPUTUM  Final   Special Requests NONE  Final   Sputum evaluation   Final    THIS SPECIMEN IS ACCEPTABLE FOR SPUTUM CULTURE Performed at [facility], [place] [address] [place], [place] [address]    Report Status 12/14/2019 FINAL  Final  Culture, respiratory     Status: None   Collection Time: 12/14/19  1:16 AM  Result Value Ref Range Status   Specimen Description   Final    EXPECTORATED SPUTUM Performed at [facility], [place] [address], [place], [place] [address]    Special Requests   Final    NONE Reflexed from [id number] Performed at [facility], [place] [address] [place], [place] [address]    Gram Stain   Final    RARE WBC PRESENT, PREDOMINANTLY PMN FEW GRAM POSITIVE COCCI IN PAIRS RARE GRAM POSITIVE RODS    Culture   Final    FEW Consistent with normal respiratory flora. Performed at [facility], [place] [address], [place], [place] [address]    Report Status 12/16/2019 FINAL  Final  Culture, Urine     Status: Abnormal  Collection Time: 12/14/19  7:44 AM   Specimen: Urine, Catheterized  Result Value Ref Range Status   Specimen Description   Final    URINE,  CATHETERIZED Performed at [facility], [address] [place], [place] [address]    Special Requests   Final    NONE Performed at [facility], [address], [place], [place] [address]    Culture (A)  Final    <10,000 COLONIES/mL INSIGNIFICANT GROWTH Performed at [facility], [address], [place], [place] [address]    Report Status 12/16/2019 FINAL  Final         Radiology Studies: DG CHEST PORT 1 VIEW  Result Date: 12/16/2019 CLINICAL DATA:  Worsening cough. Shortness of breath. EXAM: PORTABLE CHEST 1 VIEW COMPARISON:  Radiograph 2 days ago 12/14/2019 FINDINGS: Mild cardiomegaly is stable. Improving patchy right suprahilar opacity with mild residual. Subsegmental atelectasis in the left mid lung. Possible small pleural effusions. Patient's chin obscures the apices. No pulmonary edema. No pneumothorax. Advanced degenerative change of the shoulders. IMPRESSION: 1. Improving patchy right suprahilar opacity with mild residual. 2. Subsegmental atelectasis in the left mid lung. 3. Possible small pleural effusions. Electronically Signed   By: Narda Rutherford M.D.   On: 12/16/2019 15:22        Scheduled Meds: . aspirin EC  81 mg Oral Daily  . atorvastatin  40 mg Oral QHS  . brimonidine  1 drop Both Eyes BID  . carvedilol  25 mg Oral BID  . donepezil  5 mg Oral QHS  . dorzolamide  1 drop Both Eyes BID  . enoxaparin (LOVENOX) injection  30 mg Subcutaneous Q24H  . fluticasone  2 spray Each Nare Daily  . furosemide  20 mg Intravenous Q12H  . guaiFENesin  1,200 mg Oral BID  . ipratropium-albuterol  3 mL Nebulization BID  . irbesartan  300 mg Oral Daily  . latanoprost  1 drop Left Eye QHS  . loratadine  10 mg Oral Daily  . melatonin  10 mg Oral QHS  . sertraline  50 mg Oral Daily   Continuous Infusions: . sodium chloride Stopped (12/13/19 2302)  . azithromycin (ZITHROMAX) 500 MG IVPB (Vial-Mate [employer]) 500 mg (12/17/19 0207)  .  cefTRIAXone (ROCEPHIN)  IV 1 g (12/17/19 0100)     LOS: 3 days    Time spent: 35 minutes    Ramiro Harvest, MD [facility]   To contact the attending provider between 7A-7P or the covering provider during after hours 7P-7A, please log into the web site [email or web address] and access using universal [place] password for that web site. If you do not have the password, please call the hospital operator.  12/17/2019, 12:59 PM

## 2019-12-18 DIAGNOSIS — I5033 Acute on chronic diastolic (congestive) heart failure: Secondary | ICD-10-CM | POA: Diagnosis not present

## 2019-12-18 DIAGNOSIS — R0602 Shortness of breath: Secondary | ICD-10-CM

## 2019-12-18 DIAGNOSIS — J189 Pneumonia, unspecified organism: Secondary | ICD-10-CM | POA: Diagnosis not present

## 2019-12-18 DIAGNOSIS — J9601 Acute respiratory failure with hypoxia: Secondary | ICD-10-CM | POA: Diagnosis not present

## 2019-12-18 DIAGNOSIS — N1832 Chronic kidney disease, stage 3b: Secondary | ICD-10-CM | POA: Diagnosis not present

## 2019-12-18 LAB — BASIC METABOLIC PANEL
Anion gap: 12 (ref 5–15)
BUN: 24 mg/dL — ABNORMAL HIGH (ref 8–23)
CO2: 28 mmol/L (ref 22–32)
Calcium: 9.1 mg/dL (ref 8.9–10.3)
Chloride: 104 mmol/L (ref 98–111)
Creatinine, Ser: 1.36 mg/dL — ABNORMAL HIGH (ref 0.44–1.00)
GFR calc Af Amer: 38 mL/min — ABNORMAL LOW (ref >60)
GFR calc non Af Amer: 33 mL/min — ABNORMAL LOW (ref >60)
Glucose, Bld: 94 mg/dL (ref 70–99)
Potassium: 3.4 mmol/L — ABNORMAL LOW (ref 3.5–5.1)
Sodium: 144 mmol/L (ref 135–145)

## 2019-12-18 LAB — CULTURE, BLOOD (ROUTINE X 2)
Culture: NO GROWTH
Culture: NO GROWTH
Special Requests: ADEQUATE
Special Requests: ADEQUATE

## 2019-12-18 LAB — GLUCOSE, CAPILLARY
Glucose-Capillary: 82 mg/dL (ref 70–99)
Glucose-Capillary: 101 mg/dL — ABNORMAL HIGH (ref 70–99)

## 2019-12-18 LAB — HEMOGLOBIN AND HEMATOCRIT, BLOOD
HCT: 31.6 % — ABNORMAL LOW (ref 36.0–46.0)
Hemoglobin: 9.2 g/dL — ABNORMAL LOW (ref 12.0–15.0)

## 2019-12-18 IMAGING — CR DG CHEST 2V
2 series · 2 of 2 positions shown · non-contrast
Comparison: September 02, 2017

CLINICAL DATA: Cough, congestion and low-grade fever

EXAM:
CHEST - 2 VIEW

[w chest lat]
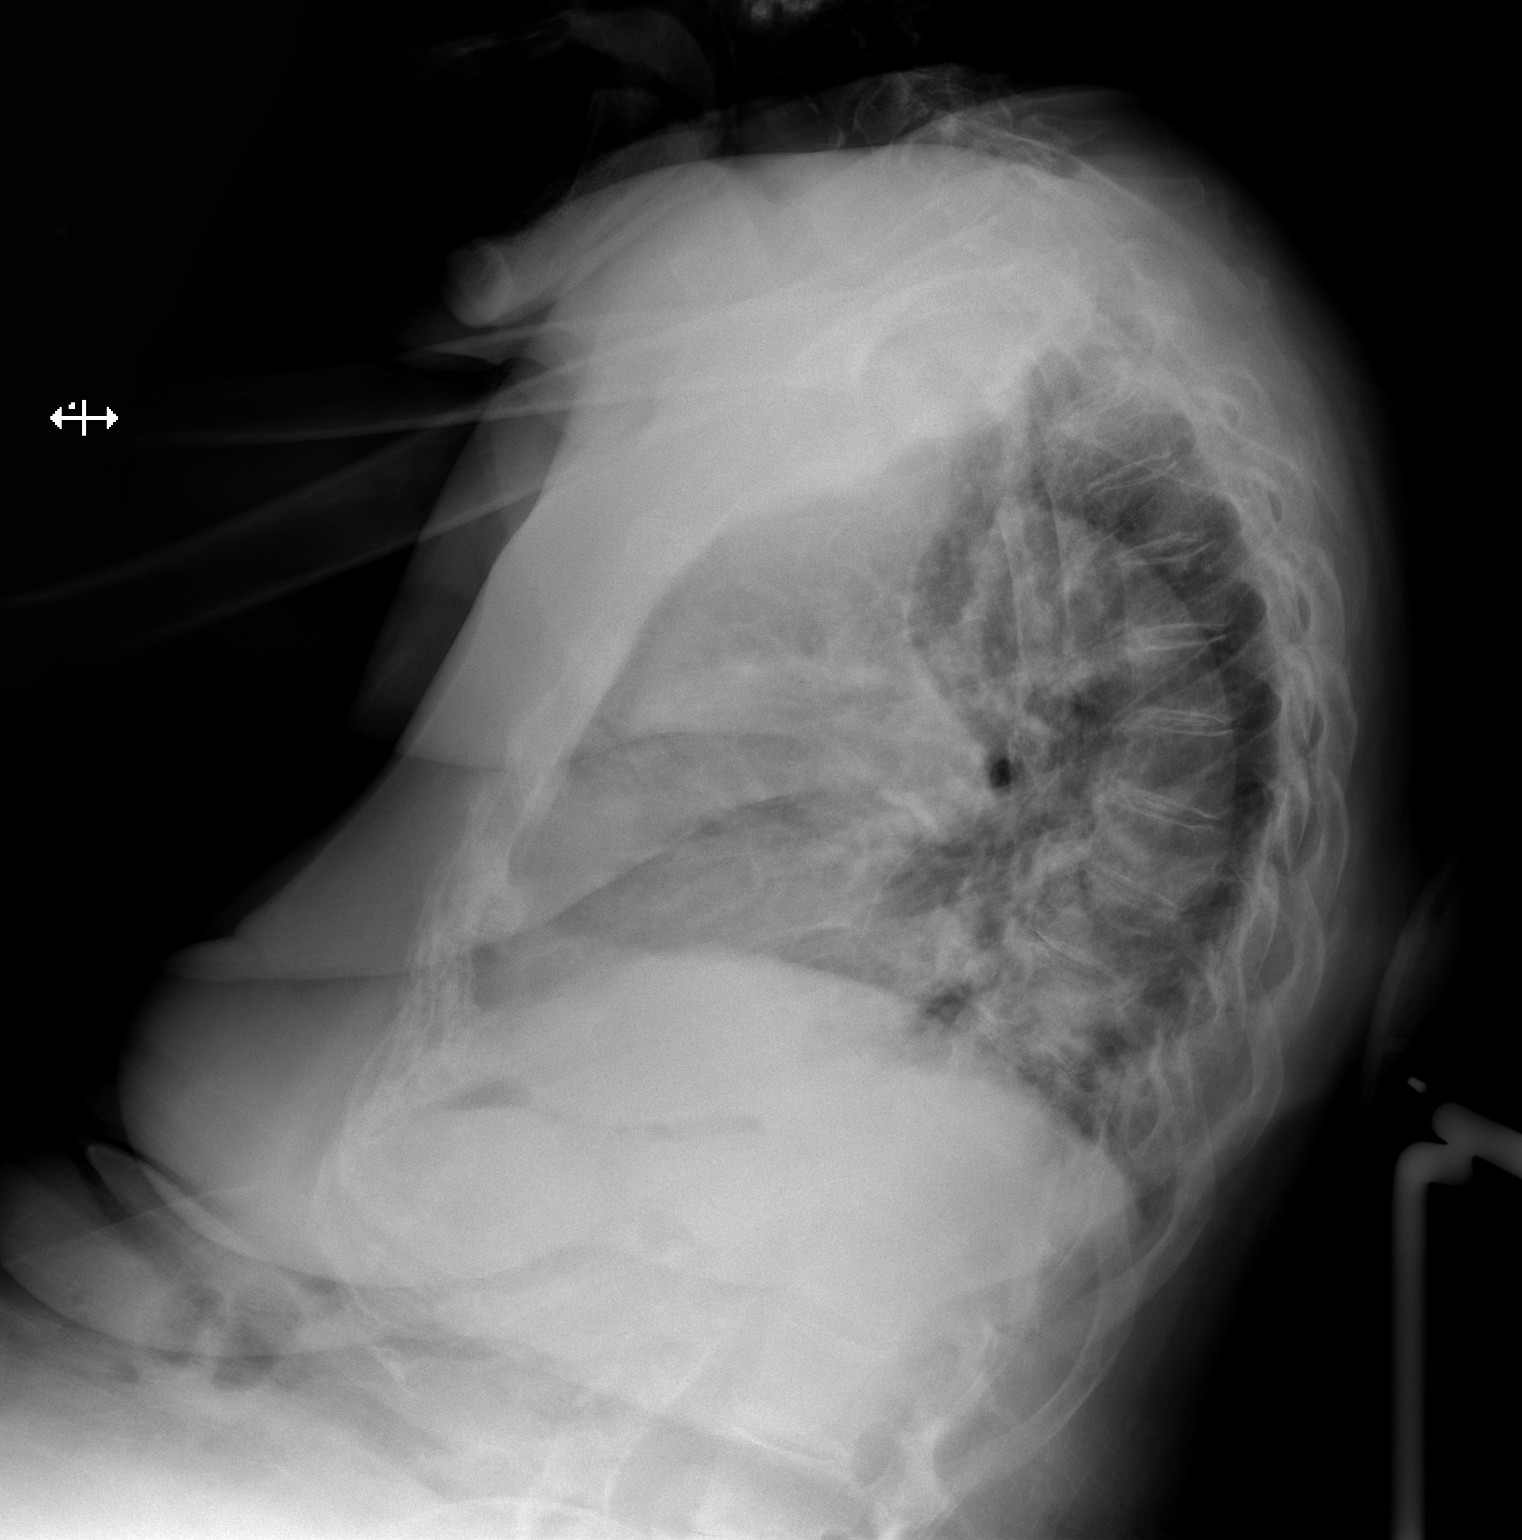

[w chest ap]
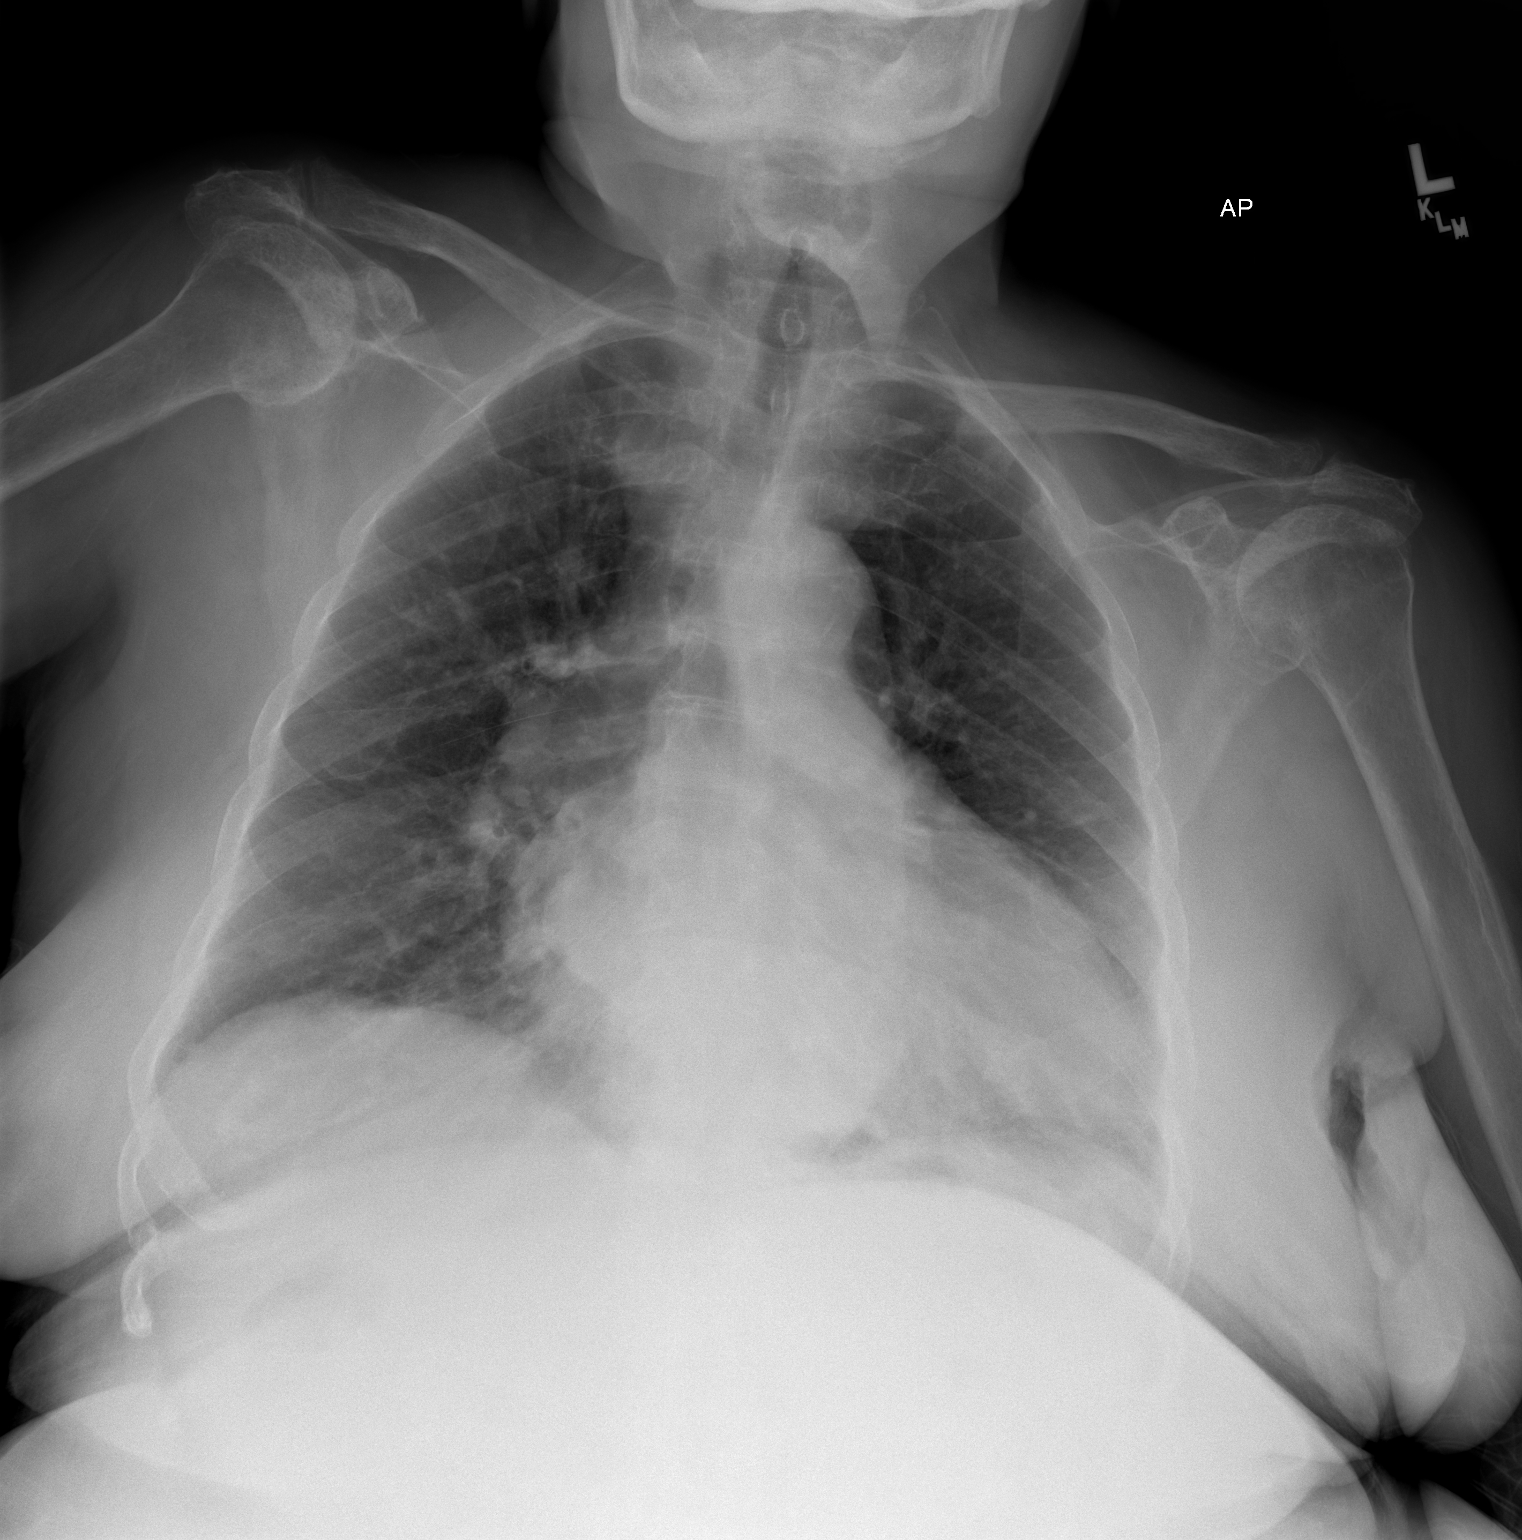

[2 of 2 positions shown; findings below may reference images not displayed]

FINDINGS: The heart size and mediastinal contours are stable. The heart size
is enlarged. The aorta is tortuous. There is enlargement of central
pulmonary vessel unchanged. There is no focal pneumonia, pulmonary
edema, or pleural effusion. The visualized skeletal structures are
stable.
IMPRESSION: No focal pneumonia.

Cardiomegaly.

Enlargement of central pulmonary vessels unchanged compared prior
exam.

## 2019-12-18 MED ORDER — FLUTICASONE PROPIONATE 50 MCG/ACT NA SUSP: 2.0000 | Freq: Every day | NASAL | 0 refills | Status: AC

## 2019-12-18 MED ORDER — IPRATROPIUM-ALBUTEROL 0.5-2.5 (3) MG/3ML IN SOLN: 3.0000 mL | Freq: Four times a day (QID) | RESPIRATORY_TRACT | Status: DC | PRN

## 2019-12-18 MED ORDER — GUAIFENESIN 100 MG/5ML PO SOLN
10.0000 mL | Freq: Four times a day (QID) | ORAL | Status: DC
  Administered 2019-12-18: 200 mg via ORAL
  Filled 2019-12-18: qty 10
  Filled 2019-12-18: qty 20

## 2019-12-18 MED ORDER — LORATADINE 10 MG PO TABS: 10.0000 mg | ORAL_TABLET | Freq: Every day | ORAL | 0 refills | Status: AC

## 2019-12-18 MED ORDER — POTASSIUM CHLORIDE 20 MEQ PO PACK
20.0000 meq | PACK | Freq: Once | ORAL | Status: AC
  Administered 2019-12-18: 20 meq via ORAL
  Filled 2019-12-18: qty 1

## 2019-12-18 MED ORDER — ALBUTEROL SULFATE (2.5 MG/3ML) 0.083% IN NEBU: 2.5000 mg | INHALATION_SOLUTION | RESPIRATORY_TRACT | Status: DC | PRN

## 2019-12-18 MED ORDER — GUAIFENESIN 100 MG/5ML PO SOLN: 10.0000 mL | Freq: Four times a day (QID) | ORAL | 0 refills | Status: AC

## 2019-12-18 NOTE — TOC Transition Note (Signed)
Transition of Care [facility]) - CM/SW Discharge Note   Patient Details  Name: AMOREENA NEUBERT MRN: [id number] Date of Birth: September 24, 1920  Transition of Care [facility]) CM/SW Contact:  Ida Rogue, LCSW Phone Number: 12/18/2019, 4:19 PM   Clinical Narrative:   Patient to d/c today.  PTAR arranged.  TOC sign off.    Final next level of care: Home w Home Health Services Barriers to Discharge: No Barriers Identified   Patient Goals and CMS Choice Patient states their goals for this hospitalization and ongoing recovery are:: Unable to say      Discharge Placement                       Discharge Plan and Services     Post Acute Care Choice: Home Health, Durable Medical Equipment                      [place] Agency: [facility]        Social Determinants of Health (SDOH) Interventions     Readmission Risk Interventions No flowsheet data found.

## 2019-12-18 NOTE — Care Management Important Message (Signed)
Important Message  Patient Details IM Letter given to Daryel Gerald SW Case Manager to present to the Patient Name: Connie Poole MRN: [id number] Date of Birth: 1920-11-23   Medicare Important Message Given:  Yes     Caren Macadam 12/18/2019, 12:26 PM

## 2019-12-18 NOTE — Discharge Summary (Signed)
Physician Discharge Summary  Connie Poole ZOX:[id number] DOB: 25-Jun-1921 DOA: 12/13/2019  PCP: Dorothyann Peng, MD  Admit date: 12/13/2019 Discharge date: 12/18/2019  Time spent: 55 minutes  Recommendations for Outpatient Follow-up:  1. Follow-up with Dorothyann Peng, MD in 1 to 2 weeks.  On follow-up patient will need a basic metabolic profile done to follow-up on electrolytes and renal function.   Discharge Diagnoses:  Principal Problem:   Acute respiratory failure with hypoxia (HCC) Active Problems:   Pneumonia of right upper lobe due to infectious organism   Lewy body dementia without behavioral disturbance (HCC)   Type 2 diabetes mellitus with stage 3 chronic kidney disease, without long-term current use of insulin (HCC)   Chronic kidney disease, stage 3b   Chronic diastolic CHF (congestive heart failure) (HCC)   Dehydration with hyponatremia   Community acquired pneumonia of right upper lobe of lung   Constipation   Acute on chronic diastolic CHF (congestive heart failure) (HCC)   Discharge Condition: Stable and improved  Diet recommendation: Dysphagia 1 diet with nectar thickened liquids.  Filed Weights   12/16/19 0813 12/16/19 1229 12/17/19 0443  Weight: 84 kg 80.3 kg 78.9 kg    History of present illness:  HPI per Dr. Leafy Half 86 year old female with past medical history of diabetes mellitus type 2, hypertension, hyperlipidemia, Lewy body dementia, diastolic ingestive heart failure and pulmonary sarcoidosis who presents to [facility] long hospital as a transfer from med [facility] emergency department for suspected right upper lobe pneumonia.  Due to patient's advanced dementia she is an extremely poor historian and the majority of the history is been obtained from the daughter who is at the bedside.  According to the daughter, patient has exhibited a 1 day history of worsening cough compared to her baseline cough productive of significant amounts of yellow sputum,  different from her scant amounts of white sputum at baseline.  Patient has also exhibited shortness of breath with past 24 hours.  In addition this patient exhibits generalized weakness, fatigue and poor appetite.  Daughter denies any recent travel, sick contacts or confirmed contacts with COVID-19.  Due to patient's progressively worsening symptoms the patient was brought into med [facility] emergency department for evaluation.  Upon evaluation in the emergency department patient was found to be hypoxic at 86% on room air on arrival.  Exhibit evidence of leukocytosis with a developing right upper lobe infiltrate on chest x-ray.  Due to concerns for developing right upper lobe pneumonia in this patient with hypoxia, the hospitalist group was called to assess the patient for admission the hospital.  Hospital Course:  1 acute respiratory failure with hypoxia secondary to aspiration pneumonia versus CAP Patient had presented with a 1 day history of progressive worsening lethargy, weakness, decreased appetite, shortness of breath and a productive cough of yellowish sputum.  Chest x-ray done concerning for right upper lobe infiltrate.    Covid 19 PCR negative.  Patient pancultured with no growth to date.  Patient improved clinically during the hospitalization. Urine strep pneumococcus antigen negative.  Concern for component of volume overload and patient was given some IV Lasix with good urine output.  Patient was maintained on IV Rocephin and IV azithromycin throughout the hospitalization as well as Flonase, Claritin, scheduled duo nebs.  Patient was also started on a dysphagia 1 diet after being assessed by speech therapy.  Patient completed a full course of IV antibiotics during the hospitalization and be discharged home in stable and improved condition.  Patient with sats of 95% on room air by day of discharge.   2.  Lewy body dementia without behavioral disturbance Patient with advanced Lewy body  dementia at baseline.  Daughter at bedside.  Patient presented with acute respiratory distress with hypoxia and probable aspiration pneumonia.  SLP has evaluated patient and patient was placed on a dysphagia 1 diet with nectar thickened liquids which he tolerated. Patient noted to be following by palliative care in the outpatient setting.  Concern that patient may be having declining worsening dementia and as such palliative care consulted and patient seen in consultation.  Patient made DNR.    Patient maintained on home regimen of Aricept and Zoloft.  Patient will be discharged home with palliative care following in the outpatient setting.   3.  Chronic kidney disease stage IIIb Stable.  4.  Well-controlled diabetes mellitus type 2 Hemoglobin A1c 6.4.    During the hospitalization patient noted to have some low blood glucose levels felt to be secondary to poor oral intake.  Patient however remained asymptomatic.  [facility] as patient oral intake improved with her daughter feeding her.  CBGs remained stable.  Outpatient follow-up.   5.  Acute on chronic diastolic CHF 2D echo from 2019 showed a preserved EF of 65 to 70% with diastolic dysfunction.  Concern for probable volume overload.  Chest x-ray done showed improving patchy right suprahilar opacity with mild residual, subsegmental atelectasis in the left midlung, possible small pleural effusions.  Noted to be elevated.  Patient placed on Lasix 20 mg IV every 12 hours x4 doses.  Patient with good urine output after 4 doses of IV Lasix and was -4.4 L during this hospitalization.  IV Lasix was subsequently transitioned to patient's home dose oral Lasix 40 mg daily.  Patient improved clinically.  By day of discharge patient was satting 95% on room air.  Outpatient follow-up.    6.  Dehydration with hyponatremia Improved with hydration.  IV fluids were subsequently saline  locked.  7.  Constipation Status post large bowel movement 12/15/2019.  Dulcolax suppository as needed.    Procedures:  Chest x-ray 12/13/2019, 12/14/2019, 12/16/2019  Consultations:  Palliative care: Dr. Linna DarnerAnwar 12/15/2019  Discharge Exam: Vitals:   12/18/19 0618 12/18/19 1500  BP: (!) 179/70 (!) 136/56  Pulse: 69 60  Resp: 16 18  Temp: 98.3 F (36.8 C) 97.6 F (36.4 C)  SpO2: 93% 99%    General: NAD Cardiovascular: Regular rate rhythm no murmurs rubs or gallops. Respiratory: Clear to auscultation anterior lung fields.  Discharge Instructions   Discharge Instructions    Diet - low sodium heart healthy   Complete by: As directed    Dysphagia 1 diet with nectar thick liquids.   Increase activity slowly   Complete by: As directed      Allergies as of 12/18/2019      Reactions   Celebrex [celecoxib] Nausea And Vomiting   Vioxx [rofecoxib] Nausea And Vomiting      Medication List    TAKE these medications   ACCU-CHEK COMPACT TEST DRUM VI by In Vitro route.   Accu-Chek Softclix Lancets lancets ONE STEP   aspirin 81 MG tablet Take 81 mg by mouth daily.   atorvastatin 40 MG tablet Commonly known as: LIPITOR TAKE 1 TABLET BY MOUTH EVERYDAY AT BEDTIME What changed: See the new instructions.   Belsomra 10 MG Tabs Generic drug: Suvorexant Take 10 mg by  mouth at bedtime as needed.   benzonatate 100 MG capsule Commonly known as: TESSALON Take 100 mg by mouth 3 (three) times daily as needed for cough.   brimonidine 0.1 % Soln Commonly known as: ALPHAGAN P Apply 1 drop to eye in the morning, at noon, and at bedtime.   CALCIUM 600+D PO Take 1 tablet by mouth daily.   carvedilol 25 MG tablet Commonly known as: COREG TAKE 1 TABLET BY MOUTH TWICE A DAY What changed: when to take this   clonazePAM 0.5 MG tablet Commonly known as: KLONOPIN TAKE 1/2 TO 1 TABLET BY MOUTH AT BEDTIME AS NEEDED What changed: reasons to take this   clonazePAM 0.5 MG  tablet Commonly known as: KLONOPIN Take 0.5-1 tablets (0.25-0.5 mg total) by mouth at bedtime as needed. What changed: Another medication with the same name was changed. Make sure you understand how and when to take each.   CORICIDIN D PO Take 1 tablet by mouth as needed (cough).   [employer] Turmeric Curcumin 500 MG Caps Generic drug: Turmeric Take 1 tablet by mouth daily.   D3-50 PO Take 50 mg by mouth daily.   donepezil 5 MG tablet Commonly known as: ARICEPT TAKE 1 TABLET BY MOUTH EVERY DAY IN THE EVENING What changed:   how much to take  how to take this  when to take this  additional instructions   dorzolamide 2 % ophthalmic solution Commonly known as: TRUSOPT Place 1 drop into both eyes 2 (two) times daily.   Fish Oil 1000 MG Caps Take 1,000 mg by mouth daily.   fluticasone 50 MCG/ACT nasal spray Commonly known as: FLONASE Place 2 sprays into both nostrils daily. Start taking on: Dec 19, 2019   furosemide 40 MG tablet Commonly known as: LASIX TAKE 1 TABLET BY MOUTH EVERY DAY   GNP Cranberry Tabs Take 1 tablet by mouth daily.   guaiFENesin 100 MG/5ML Soln Commonly known as: ROBITUSSIN Take 10 mLs (200 mg total) by mouth every 6 (six) hours for 3 days.   hydrocortisone 2.5 % rectal cream Commonly known as: Anusol-HC Place 1 application rectally 2 (two) times daily. What changed:   when to take this  reasons to take this   hydrocortisone 25 MG suppository Commonly known as: Anusol-HC Place 1 suppository (25 mg total) rectally 2 (two) times daily. What changed:   when to take this  reasons to take this   ipratropium 0.06 % nasal spray Commonly known as: ATROVENT Place 2 sprays into both nostrils every 8 (eight) hours as needed.   Klor-Con M20 20 MEQ tablet Generic drug: potassium chloride SA TAKE 1 TABLET BY MOUTH EVERY DAY WITH FOOD What changed:   how much to take  when to take this  additional instructions   loratadine 10 MG  tablet Commonly known as: CLARITIN Take 1 tablet (10 mg total) by mouth daily. Start taking on: Dec 19, 2019   Lumigan 0.01 % Soln Generic drug: bimatoprost Place 1 drop into the left eye at bedtime.   Melatonin 10 MG Tbcr Take 1 tablet by mouth at bedtime as needed (sleep aid).   PRESERVISION AREDS 2 PO Take 1 tablet by mouth daily.   ADVANCED DIABETIC MULTIVITAMIN PO Take 1 tablet by mouth daily.   sertraline 50 MG tablet Commonly known as: ZOLOFT TAKE 1 TABLET BY MOUTH EVERY DAY What changed:   how much to take  how to take this  when to take this  additional instructions  telmisartan 80 MG tablet Commonly known as: MICARDIS TAKE 1 TABLET BY MOUTH EVERY DAY      Allergies  Allergen Reactions  . Celebrex [Celecoxib] Nausea And Vomiting  . Vioxx [Rofecoxib] Nausea And Vomiting   Follow-up Information    [facility] Follow up.   Specialty: Home Health Services Why: This is the agency that will be working with you for physical therapy Contact information: [address] [place] [address] [phone]        Dorothyann Peng, MD. Schedule an appointment as soon as possible for a visit in 1 week(s).   Specialty: Internal Medicine Why: f/u in 1-2 weeks. Contact information: [address] [place] [address] [phone]            The results of significant diagnostics from this hospitalization (including imaging, microbiology, ancillary and laboratory) are listed below for reference.    Significant Diagnostic Studies: DG Chest 2 View  Result Date: 12/14/2019 CLINICAL DATA:  Shortness of breath EXAM: CHEST - 2 VIEW COMPARISON:  December 13, 2019 FINDINGS: There remains rather subtle increased opacity in the right upper lobe. Lungs elsewhere are clear. Heart is enlarged with pulmonary vascularity normal. No adenopathy. There is aortic atherosclerosis. Degenerative change noted in left shoulder and thoracic spine.  IMPRESSION: Persistent rather subtle increased opacity in right upper lobe, concerning for early developing pneumonia. No new opacity evident. Heart enlarged. No adenopathy. Aortic Atherosclerosis (ICD10-I70.0). Electronically Signed   By: Bretta Bang III M.D.   On: 12/14/2019 10:40   DG CHEST PORT 1 VIEW  Result Date: 12/16/2019 CLINICAL DATA:  Worsening cough. Shortness of breath. EXAM: PORTABLE CHEST 1 VIEW COMPARISON:  Radiograph 2 days ago 12/14/2019 FINDINGS: Mild cardiomegaly is stable. Improving patchy right suprahilar opacity with mild residual. Subsegmental atelectasis in the left mid lung. Possible small pleural effusions. Patient's chin obscures the apices. No pulmonary edema. No pneumothorax. Advanced degenerative change of the shoulders. IMPRESSION: 1. Improving patchy right suprahilar opacity with mild residual. 2. Subsegmental atelectasis in the left mid lung. 3. Possible small pleural effusions. Electronically Signed   By: Narda Rutherford M.D.   On: 12/16/2019 15:22   DG Chest Port 1 View  Result Date: 12/13/2019 CLINICAL DATA:  Shortness of breath and cough for 2 days EXAM: PORTABLE CHEST 1 VIEW COMPARISON:  05/12/2018 FINDINGS: Cardiac shadow is at the upper limits of normal in size but stable. Aortic calcifications are noted. The lungs are well aerated bilaterally. Patchy opacities are seen in the right upper lobe which may represent some early infiltrate. No sizable effusion is seen. No bony abnormality is noted. IMPRESSION: Patchy opacity in the right upper lobe which may represent some early infiltrate. Electronically Signed   By: Alcide Clever M.D.   On: 12/13/2019 19:32    Microbiology: Recent Results (from the past 240 hour(s))  Blood Culture (routine x 2)     Status: None   Collection Time: 12/13/19  7:38 PM   Specimen: BLOOD  Result Value Ref Range Status   Specimen Description   Final    BLOOD LEFT ANTECUBITAL Performed at [facility], [address] [place], [place] [address]    Special Requests   Final    BOTTLES DRAWN AEROBIC AND ANAEROBIC Blood Culture adequate volume Performed at [facility], [address] [place], [place] [address]    Culture   Final    NO GROWTH 5 DAYS Performed at  [facility], [place] [address] [place], [place] [address]    Report Status 12/18/2019 FINAL  Final  Blood Culture (routine x 2)     Status: None   Collection Time: 12/13/19  7:50 PM   Specimen: BLOOD  Result Value Ref Range Status   Specimen Description   Final    BLOOD RIGHT ANTECUBITAL Performed at [facility], [address] [place], [place] [address]    Special Requests   Final    BOTTLES DRAWN AEROBIC AND ANAEROBIC Blood Culture adequate volume Performed at [facility], [address] [place], [place] [address]    Culture   Final    NO GROWTH 5 DAYS Performed at [facility], [place] [address], [place], [place] [address]    Report Status 12/18/2019 FINAL  Final  Respiratory Panel by RT PCR (Flu A&B, Covid) - Nasopharyngeal Swab     Status: None   Collection Time: 12/13/19  9:09 PM   Specimen: Nasopharyngeal Swab  Result Value Ref Range Status   SARS Coronavirus 2 by RT PCR NEGATIVE NEGATIVE Final    Comment: (NOTE) SARS-CoV-2 target nucleic acids are NOT DETECTED. The SARS-CoV-2 RNA is generally detectable in upper respiratoy specimens during the acute phase of infection. The lowest concentration of SARS-CoV-2 viral copies this assay can detect is 131 copies/mL. A negative result does not preclude SARS-Cov-2 infection and should not be used as the sole basis for treatment or other patient management decisions. A negative result may occur with  improper specimen collection/handling, submission of specimen other than nasopharyngeal swab, presence of viral mutation(s) within the areas targeted by this assay, and inadequate number of viral copies (<131 copies/mL). A negative result must  be combined with clinical observations, patient history, and epidemiological information. The expected result is Negative. Fact Sheet for Patients:  PinkCheek.be Fact Sheet for Healthcare Providers:  GravelBags.it This test is not yet ap proved or cleared by the [place] FDA and  has been authorized for detection and/or diagnosis of SARS-CoV-2 by FDA under an Emergency Use Authorization (EUA). This EUA will remain  in effect (meaning this test can be used) for the duration of the COVID-19 declaration under Section 564(b)(1) of the Act, 21 U.S.C. section 360bbb-3(b)(1), unless the authorization is terminated or revoked sooner.    Influenza A by PCR NEGATIVE NEGATIVE Final   Influenza B by PCR NEGATIVE NEGATIVE Final    Comment: (NOTE) The Xpert Xpress SARS-CoV-2/FLU/RSV assay is intended as an aid in  the diagnosis of influenza from Nasopharyngeal swab specimens and  should not be used as a sole basis for treatment. Nasal washings and  aspirates are unacceptable for Xpert Xpress SARS-CoV-2/FLU/RSV  testing. Fact Sheet for Patients: PinkCheek.be Fact Sheet for Healthcare Providers: GravelBags.it This test is not yet approved or cleared by the [place] FDA and  has been authorized for detection and/or diagnosis of SARS-CoV-2 by  FDA under an Emergency Use Authorization (EUA). This EUA will remain  in effect (meaning this test can be used) for the duration of the  Covid-19 declaration under Section 564(b)(1) of the Act, 21  U.S.C. section 360bbb-3(b)(1), unless the authorization is  terminated or revoked. Performed at [facility], [address] [place], [place] [address]   Expectorated sputum assessment w rflx to resp cult     Status: None   Collection Time: 12/14/19  1:16 AM   Specimen: Expectorated Sputum  Result Value  Ref Range Status    Specimen Description EXPECTORATED SPUTUM  Final   Special Requests NONE  Final   Sputum evaluation   Final    THIS SPECIMEN IS ACCEPTABLE FOR SPUTUM CULTURE Performed at [facility], [address], [place], [place] [address]    Report Status 12/14/2019 FINAL  Final  Culture, respiratory     Status: None   Collection Time: 12/14/19  1:16 AM  Result Value Ref Range Status   Specimen Description   Final    EXPECTORATED SPUTUM Performed at [facility], [address], [place], [place] [address]    Special Requests   Final    NONE Reflexed from [id number] Performed at [facility], [address] [place], [place] [address]    Gram Stain   Final    RARE WBC PRESENT, PREDOMINANTLY PMN FEW GRAM POSITIVE COCCI IN PAIRS RARE GRAM POSITIVE RODS    Culture   Final    FEW Consistent with normal respiratory flora. Performed at [facility], [address] [place], [place] [address]    Report Status 12/16/2019 FINAL  Final  Culture, Urine     Status: Abnormal   Collection Time: 12/14/19  7:44 AM   Specimen: Urine, Catheterized  Result Value Ref Range Status   Specimen Description   Final    URINE, CATHETERIZED Performed at [facility], [address] [place], [place] [address]    Special Requests   Final    NONE Performed at [facility], [address] [place], [place] [address]    Culture (A)  Final    <10,000 COLONIES/mL INSIGNIFICANT GROWTH Performed at [facility], [address], [place], [place] [address]    Report Status 12/16/2019 FINAL  Final     Labs: Basic Metabolic Panel: Recent Labs  Lab 12/14/19 0804 12/15/19 0650 12/16/19 0520 12/17/19 0541 12/18/19 0604  NA 135 139 141 142 144  K 4.2 4.4 4.0 3.6 3.4*  CL 102 106 106 106 104  CO2 GLUCOSE 115* 69* 105* 115* 94  BUN 49* 37* 28* 26* 24*  CREATININE 1.31* 1.29* 1.13* 1.28* 1.36*   CALCIUM 8.7* 8.8* 9.2 9.0 9.1  MG 2.1  --   --   --   --    Liver Function Tests: Recent Labs  Lab 12/13/19 1901  AST 22  ALT 30  ALKPHOS 64  BILITOT 0.6  PROT 7.8  ALBUMIN 3.4*   No results for input(s): LIPASE, AMYLASE in the last 168 hours. No results for input(s): AMMONIA in the last 168 hours. CBC: Recent Labs  Lab 12/13/19 1901 12/13/19 1901 12/14/19 0931 12/15/19 0650 12/16/19 0520 12/17/19 0541 12/18/19 0604  WBC 12.7*  --  8.5 7.5 8.6 7.7  --   NEUTROABS 9.2*  --  5.5 4.2  --   --   --   HGB 9.3*   < > 8.2* 8.0* 9.6* 8.7* 9.2*  HCT 29.8*   < > 27.1* 27.5* 32.7* 30.9* 31.6*  MCV 89.2  --  92.2 95.2 94.2 94.8  --   PLT 360  --  312 338 409* 398  --    < > = values in this interval not displayed.   Cardiac Enzymes: No results for input(s): CKTOTAL, CKMB, CKMBINDEX, TROPONINI in the last 168 hours. BNP: BNP (last 3 results) Recent Labs    12/16/19 0520 12/17/19 0541  BNP 793.2* 894.0*    ProBNP (last 3 results) No results for input(s): PROBNP in the last 8760 hours.  CBG: Recent Labs  Lab 12/17/19 1155 12/17/19 1707 12/17/19 2327 12/18/19 0721 12/18/19 1132  GLUCAP 105* 91 89 82 101*       Signed:  Ramiro Harvest MD.  [facility] 12/18/2019, 3:59 PM

## 2019-12-18 NOTE — Progress Notes (Signed)
  Speech Language Pathology Treatment: Dysphagia  Patient Details Name: Connie Poole MRN: [id number] DOB: 10-24-1920 Today's Date: 12/18/2019 Time: 7356-7014 SLP Time Calculation (min) (ACUTE ONLY): 15 min  Assessment / Plan / Recommendation Clinical Impression  SLP educated daughter [name] to aspiration mitigation strategies for dysphagia with dementia including potential risks/benefits of modified diets, importance of oral care using soft bristle toothbrush, modifying diet based on pt's performance and concept of comfort care with written and verbal information provided using teach back.  Daughter [name] reports that her father had dysphagia and thus she has experienced these issues previously.  Pt was not alert enough for po - but all education completed re: mitigation strategies.  No follow up needed. Thanks for this consult.   HPI   86 year old female with past medical history of diabetes mellitus type 2, hypertension, hyperlipidemia, Lewy body dementia, diastolic congestive heart failure and pulmonary sarcoidosis. CXR =Persistent rather subtle increased opacity in right upper lobe,concerning for early developing pneumonia     SLP Plan  All goals met       Recommendations  Diet recommendations: Dysphagia 1 (puree);Nectar-thick liquid(thin water between meals) Liquids provided via: Cup;Straw;Teaspoon Medication Administration: Crushed with puree Supervision: Full supervision/cueing for compensatory strategies Compensations: Minimize environmental distractions;Slow rate;Small sips/bites Postural Changes and/or Swallow Maneuvers: Seated upright 90 degrees;Upright 30-60 min after meal                Oral Care Recommendations: Staff/trained caregiver to provide oral care Follow up Recommendations: 24 hour supervision/assistance SLP Visit Diagnosis: Dysphagia, unspecified (R13.10) Plan: All goals met       GO               Connie Lime, MS [facility] SLP Acute Rehab Services Office  [phone]  Macario Golds 12/18/2019, 12:24 PM

## 2019-12-18 NOTE — Progress Notes (Signed)
Discharge instructions given to patients daughter with stated understanding.

## 2019-12-19 ENCOUNTER — Telehealth: Payer: Self-pay

## 2019-12-19 NOTE — Telephone Encounter (Signed)
Transition Care Management Follow-up Telephone Call  Date of discharge and from where: 12/18/2019 [facility]  How have you been since you were released from the hospital? The pt is having some confusion, she isn't coughing up as much phlem.  Any questions or concerns? Yes, would like to know if the last x-ray was better.  Items Reviewed:  Did the pt receive and understand the discharge instructions provided? Yes  Medications obtained and verified? No because she is in the grocery store, she said she will call back.  Any new allergies since your discharge? No  Dietary orders reviewed?Yes  Do you have support at home? Yes  Other (ie: DME, Home Health, etc) Yes  Functional Questionnaire: (I = Independent and D = Dependent) ADL's: D  Bathing/Dressing- D   Meal Prep- D  Eating- D  Maintaining continence- D  Transferring/Ambulation- D  Managing Meds- D   Follow up appointments reviewed:    PCP Hospital f/u appt confirmed? YES Scheduled to see Dr. Allyne Gee on 12/20/2019 at 3:30pm.  Specialist Hospital f/u appt confirmed? N/A  Are transportation arrangements needed? No  If their condition worsens, is the pt aware to call  their PCP or go to the ED? Yes  Was the patient provided with contact information for the PCP's office or ED? Yes Was the pt encouraged to call back with questions or concerns? Yes

## 2019-12-20 ENCOUNTER — Telehealth: Payer: Medicare PPO | Admitting: Internal Medicine

## 2019-12-20 ENCOUNTER — Other Ambulatory Visit: Payer: Self-pay

## 2019-12-20 ENCOUNTER — Telehealth: Payer: Self-pay

## 2019-12-20 ENCOUNTER — Encounter (HOSPITAL_COMMUNITY): Payer: Self-pay | Admitting: Student

## 2019-12-20 ENCOUNTER — Emergency Department (HOSPITAL_COMMUNITY)
Admission: EM | Admit: 2019-12-20 | Discharge: 2019-12-20 | Disposition: A | Discharge status: Home / Self Care | Payer: Medicare PPO | Attending: Emergency Medicine | Admitting: Emergency Medicine

## 2019-12-20 ENCOUNTER — Emergency Department (HOSPITAL_COMMUNITY): Payer: Medicare PPO

## 2019-12-20 DIAGNOSIS — R05 Cough: Secondary | ICD-10-CM | POA: Diagnosis not present

## 2019-12-20 DIAGNOSIS — J189 Pneumonia, unspecified organism: Secondary | ICD-10-CM | POA: Diagnosis not present

## 2019-12-20 DIAGNOSIS — E1122 Type 2 diabetes mellitus with diabetic chronic kidney disease: Secondary | ICD-10-CM | POA: Diagnosis not present

## 2019-12-20 DIAGNOSIS — I130 Hypertensive heart and chronic kidney disease with heart failure and stage 1 through stage 4 chronic kidney disease, or unspecified chronic kidney disease: Secondary | ICD-10-CM | POA: Insufficient documentation

## 2019-12-20 DIAGNOSIS — R531 Weakness: Secondary | ICD-10-CM | POA: Diagnosis not present

## 2019-12-20 DIAGNOSIS — N1832 Chronic kidney disease, stage 3b: Secondary | ICD-10-CM | POA: Insufficient documentation

## 2019-12-20 DIAGNOSIS — I5032 Chronic diastolic (congestive) heart failure: Secondary | ICD-10-CM | POA: Diagnosis not present

## 2019-12-20 DIAGNOSIS — R0602 Shortness of breath: Secondary | ICD-10-CM | POA: Diagnosis not present

## 2019-12-20 DIAGNOSIS — R0600 Dyspnea, unspecified: Secondary | ICD-10-CM

## 2019-12-20 DIAGNOSIS — Z79899 Other long term (current) drug therapy: Secondary | ICD-10-CM | POA: Insufficient documentation

## 2019-12-20 DIAGNOSIS — Z7984 Long term (current) use of oral hypoglycemic drugs: Secondary | ICD-10-CM | POA: Insufficient documentation

## 2019-12-20 LAB — BASIC METABOLIC PANEL
Anion gap: 14 (ref 5–15)
BUN: 33 mg/dL — ABNORMAL HIGH (ref 8–23)
CO2: 29 mmol/L (ref 22–32)
Calcium: 9.3 mg/dL (ref 8.9–10.3)
Chloride: 100 mmol/L (ref 98–111)
Creatinine, Ser: 1.42 mg/dL — ABNORMAL HIGH (ref 0.44–1.00)
GFR calc Af Amer: 36 mL/min — ABNORMAL LOW (ref >60)
GFR calc non Af Amer: 31 mL/min — ABNORMAL LOW (ref >60)
Glucose, Bld: 170 mg/dL — ABNORMAL HIGH (ref 70–99)
Potassium: 3.7 mmol/L (ref 3.5–5.1)
Sodium: 143 mmol/L (ref 135–145)

## 2019-12-20 LAB — CBC WITH DIFFERENTIAL/PLATELET
Abs Immature Granulocytes: 0.07 10*3/uL (ref 0.00–0.07)
Basophils Absolute: 0 10*3/uL (ref 0.0–0.1)
Basophils Relative: 0 %
Eosinophils Absolute: 0 10*3/uL (ref 0.0–0.5)
Eosinophils Relative: 0 %
HCT: 32.8 % — ABNORMAL LOW (ref 36.0–46.0)
Hemoglobin: 9.7 g/dL — ABNORMAL LOW (ref 12.0–15.0)
Immature Granulocytes: 0 %
Lymphocytes Relative: 10 %
Lymphs Abs: 1.5 10*3/uL (ref 0.7–4.0)
MCH: 27.5 pg (ref 26.0–34.0)
MCHC: 29.6 g/dL — ABNORMAL LOW (ref 30.0–36.0)
MCV: 92.9 fL (ref 80.0–100.0)
Monocytes Absolute: 0.8 10*3/uL (ref 0.1–1.0)
Monocytes Relative: 5 %
Neutro Abs: 13.4 10*3/uL — ABNORMAL HIGH (ref 1.7–7.7)
Neutrophils Relative %: 85 %
Platelets: 410 10*3/uL — ABNORMAL HIGH (ref 150–400)
RBC: 3.53 MIL/uL — ABNORMAL LOW (ref 3.87–5.11)
RDW: 13.5 % (ref 11.5–15.5)
WBC: 15.9 10*3/uL — ABNORMAL HIGH (ref 4.0–10.5)
nRBC: 0 % (ref 0.0–0.2)

## 2019-12-20 MED ORDER — SODIUM CHLORIDE 0.9 % IV SOLN: INTRAVENOUS | Status: DC

## 2019-12-20 NOTE — ED Provider Notes (Addendum)
[facility]-EMERGENCY DEPT Provider Note   CSN: [id number] Arrival date & time: 12/20/19  1143     History No chief complaint on file.   Connie Poole is a 86 y.o. female.  86 year old female with history of dementia with recent admission and discharged 2 days ago for pneumonia who was not sent home on any antibiotics presents after she complained to her daughter that she was short of breath.  No reported fever, cough.  Daughter states that she is at her baseline.  No reported vomiting or diarrhea.  EMS was called and patient's pulse oximetry was in the mid 90s.  The daughter has a record of her pulse ox for last several days and states that they have been stable.  She did not go home on home oxygen.  Was transferred here for further management        Past Medical History:  Diagnosis Date  . Arthritis   . CHF (congestive heart failure) (HCC)   . Dementia (HCC)   . Diabetes mellitus   . Glaucoma   . Hyperlipidemia   . Hypertension   . Sarcoidosis     Patient Active Problem List   Diagnosis Date Noted  . SOB (shortness of breath)   . Acute on chronic diastolic CHF (congestive heart failure) (HCC)   . Community acquired pneumonia of right upper lobe of lung   . Constipation   . Acute respiratory failure with hypoxia (HCC) 12/14/2019  . Chronic kidney disease, stage 3b 12/14/2019  . Chronic diastolic CHF (congestive heart failure) (HCC) 12/14/2019  . Dehydration with hyponatremia 12/14/2019  . Pneumonia of right upper lobe due to infectious organism 12/13/2019  . Type 2 diabetes mellitus with stage 3 chronic kidney disease, without long-term current use of insulin (HCC) 01/29/2019  . Decreased mobility 12/25/2018  . Allergic rhinitis 12/25/2018  . Dysuria 12/25/2018  . Debility 12/25/2018  . Hypertensive nephropathy 04/26/2018  . Cutaneous abscess of face 04/26/2018  . Wheezing 04/26/2018  . Confusion 04/26/2018  . REM sleep behavior disorder  11/18/2015  . Formed visual hallucinations 11/18/2015  . Lewy body dementia without behavioral disturbance (HCC) 11/18/2015  . Irreducible umbilical hernia 06/13/2012    Past Surgical History:  Procedure Laterality Date  . ABDOMINAL HYSTERECTOMY    . TOTAL KNEE ARTHROPLASTY       OB History   No obstetric history on file.     Family History  Problem Relation Age of Onset  . Stroke Mother   . Heart disease Father   . Prostate cancer Brother   . Cancer Brother   . Throat cancer Brother   . Alzheimer's disease Other   . Coronary artery disease Other   . Diabetes Other   . Hypertension Other     Social History   Tobacco Use  . Smoking status: Never Smoker  . Smokeless tobacco: Never Used  Substance Use Topics  . Alcohol use: No    Alcohol/week: 0.0 standard drinks  . Drug use: Not Currently    Home Medications Prior to Admission medications   Medication Sig Start Date End Date Taking? Authorizing Provider  aspirin 81 MG tablet Take 81 mg by mouth daily.    [provider]  atorvastatin (LIPITOR) 40 MG tablet TAKE 1 TABLET BY MOUTH EVERYDAY AT BEDTIME Patient taking differently: Take 40 mg by mouth every Monday, Tuesday, Wednesday, Thursday, and Friday.  08/07/19   Dorothyann Peng, MD  benzonatate (TESSALON) 100 MG capsule Take 100  mg by mouth 3 (three) times daily as needed for cough. 12/04/19   [provider]  brimonidine (ALPHAGAN P) 0.1 % SOLN Apply 1 drop to eye in the morning, at noon, and at bedtime.     [provider]  Calcium Carbonate-Vitamin D (CALCIUM 600+D PO) Take 1 tablet by mouth daily.     [provider]  carvedilol (COREG) 25 MG tablet TAKE 1 TABLET BY MOUTH TWICE A DAY Patient taking differently: Take 25 mg by mouth in the morning and at bedtime.  07/02/19   Glendale Chard, MD  Chlorphen-Pseudoephed-APAP (CORICIDIN D PO) Take 1 tablet by mouth as needed (cough).     [provider]  Cholecalciferol (D3-50  PO) Take 50 mg by mouth daily.    [provider]  clonazePAM (KLONOPIN) 0.5 MG tablet TAKE 1/2 TO 1 TABLET BY MOUTH AT BEDTIME AS NEEDED Patient taking differently: Take 0.25-0.5 mg by mouth at bedtime as needed for anxiety.  12/05/19   Dohmeier, Asencion Partridge, MD  clonazePAM (KLONOPIN) 0.5 MG tablet Take 0.5-1 tablets (0.25-0.5 mg total) by mouth at bedtime as needed. Patient not taking: Reported on 12/14/2019 12/05/19   Dohmeier, Asencion Partridge, MD  Cranberry-Vit C-Probiotic-Ca (GNP CRANBERRY) TABS Take 1 tablet by mouth daily.     [provider]  donepezil (ARICEPT) 5 MG tablet TAKE 1 TABLET BY MOUTH EVERY DAY IN THE EVENING Patient taking differently: Take 5 mg by mouth daily.  06/25/19   Glendale Chard, MD  dorzolamide (TRUSOPT) 2 % ophthalmic solution Place 1 drop into both eyes 2 (two) times daily.     [provider]  fluticasone (FLONASE) 50 MCG/ACT nasal spray Place 2 sprays into both nostrils daily. 12/19/19   Eugenie Filler, MD  furosemide (LASIX) 40 MG tablet TAKE 1 TABLET BY MOUTH EVERY DAY Patient taking differently: Take 40 mg by mouth daily.  06/26/19   Glendale Chard, MD  Glucose Blood (ACCU-CHEK COMPACT TEST DRUM VI) by In Vitro route.    [provider]  guaiFENesin (ROBITUSSIN) 100 MG/5ML SOLN Take 10 mLs (200 mg total) by mouth every 6 (six) hours for 3 days. 12/18/19 12/21/19  Eugenie Filler, MD  hydrocortisone (ANUSOL-HC) 2.5 % rectal cream Place 1 application rectally 2 (two) times daily. Patient taking differently: Place 1 application rectally 2 (two) times daily as needed for hemorrhoids or anal itching.  04/26/19   Rodriguez-Southworth, Sunday Spillers, PA-C  hydrocortisone (ANUSOL-HC) 25 MG suppository Place 1 suppository (25 mg total) rectally 2 (two) times daily. Patient taking differently: Place 25 mg rectally 2 (two) times daily as needed for hemorrhoids or anal itching.  04/26/19   Rodriguez-Southworth, Sunday Spillers, PA-C  ipratropium (ATROVENT) 0.06 % nasal  spray Place 2 sprays into both nostrils every 8 (eight) hours as needed.     [provider]  KLOR-CON M20 20 MEQ tablet TAKE 1 TABLET BY MOUTH EVERY DAY WITH FOOD Patient taking differently: Take 20 mEq by mouth at bedtime.  06/26/19   Glendale Chard, MD  loratadine (CLARITIN) 10 MG tablet Take 1 tablet (10 mg total) by mouth daily. 12/19/19   Eugenie Filler, MD  LUMIGAN 0.01 % SOLN Place 1 drop into the left eye at bedtime.  04/26/18   [provider]  Melatonin 10 MG TBCR Take 1 tablet by mouth at bedtime as needed (sleep aid).     [provider]  Multiple Vitamins-Minerals (ADVANCED DIABETIC MULTIVITAMIN PO) Take 1 tablet by mouth daily.    [provider]  Multiple Vitamins-Minerals (PRESERVISION AREDS 2 PO) Take 1 tablet by mouth daily.    [provider]  Omega-3 Fatty Acids (FISH OIL) 1000 MG CAPS Take 1,000 mg by mouth daily.    [provider]  sertraline (ZOLOFT) 50 MG tablet TAKE 1 TABLET BY MOUTH EVERY DAY Patient taking differently: Take 50 mg by mouth daily.  06/26/19   Dorothyann Peng, MD  Suvorexant (BELSOMRA) 10 MG TABS Take 10 mg by mouth at bedtime as needed. 06/12/19   Arnette Felts, FNP  telmisartan (MICARDIS) 80 MG tablet TAKE 1 TABLET BY MOUTH EVERY DAY Patient taking differently: Take 80 mg by mouth daily.  04/09/19   Dorothyann Peng, MD  Turmeric ([employer] TURMERIC CURCUMIN) 500 MG CAPS Take 1 tablet by mouth daily.    [provider]    Allergies    Celebrex [celecoxib] and Vioxx [rofecoxib]  Review of Systems   Review of Systems  All other systems reviewed and are negative.   Physical Exam Updated Vital Signs There were no vitals taken for this visit.  Physical Exam Vitals and nursing note reviewed.  Constitutional:      General: She is not in acute distress.    Appearance: Normal appearance. She is well-developed. She is not toxic-appearing.  HENT:     Head: Normocephalic and atraumatic.  Eyes:       General: Lids are normal.     Conjunctiva/sclera: Conjunctivae normal.     Pupils: Pupils are equal, round, and reactive to light.  Neck:     Thyroid: No thyroid mass.     Trachea: No tracheal deviation.  Cardiovascular:     Rate and Rhythm: Normal rate and regular rhythm.     Heart sounds: Normal heart sounds. No murmur. No gallop.   Pulmonary:     Effort: Pulmonary effort is normal. No respiratory distress.     Breath sounds: Normal breath sounds. No stridor. No decreased breath sounds, wheezing, rhonchi or rales.  Abdominal:     General: Bowel sounds are normal. There is no distension.     Palpations: Abdomen is soft.     Tenderness: There is no abdominal tenderness. There is no rebound.  Musculoskeletal:        General: No tenderness. Normal range of motion.     Cervical back: Normal range of motion and neck supple.  Skin:    General: Skin is warm and dry.     Findings: No abrasion or rash.  Neurological:     General: No focal deficit present.     Mental Status: She is alert. Mental status is at baseline. She is disoriented.     GCS: GCS eye subscore is 4. GCS verbal subscore is 5. GCS motor subscore is 6.  Psychiatric:        Attention and Perception: She is inattentive.     ED Results / Procedures / Treatments   Labs (all labs ordered are listed, but only abnormal results are displayed) Labs Reviewed - No data to display  EKG None  Radiology No results found.  Procedures Procedures (including critical care time)  Medications Ordered in ED Medications - No data to display  ED Course  I have reviewed the triage vital signs and the nursing notes.  Pertinent labs & imaging results that were available during my care of the patient were reviewed by me and considered in my medical decision making (see chart for details).    MDM Rules/Calculators/A&P  Patient's chest x-ray without acute infiltrates and shows improvement.  Labs otherwise  around her baseline.  Patient states that she is at her neurological baseline at this time.  Pulse oximetry on room air is overnight 5%.  Will discharge home  2:09 PM Patient's nurse noted that at time of discharge patient was sleeping and pulse oximetry was in the high 80s.  When she was awake patient's pulse ox is in the low to mid 90s.  According to the daughter, patient has had trouble with hypoventilation.  Daughter feels comfortable taking her home. Final Clinical Impression(s) / ED Diagnoses Final diagnoses:  None    Rx / DC Orders ED Discharge Orders    None       Lorre Nick, MD 12/20/19 1354    Lorre Nick, MD 12/20/19 1410

## 2019-12-20 NOTE — Progress Notes (Signed)
Civil engineer, contracting [facility]) Community Based Palliative Care       This patient is enrolled in our palliative care services in the community.    [facility] will continue to follow during this hospitalization.     If you have questions or need assistance, please call [phone] or contact the hospital Liaison listed on AMION.        Roda Shutters, RN [facility] Liaison   [phone]

## 2019-12-20 NOTE — ED Notes (Signed)
Patient arrives via [facility] from home with family.  Recent hospital stay with discharge home on Tuesday.  Patient complaining of being SOB.  Hx: dementia.

## 2019-12-20 NOTE — ED Notes (Signed)
Patient oxygen sat is 92-96% when awake and taking deep breaths, drops at times when patient is sleeping, Dr. Freida Busman aware and patient's daughter and Dr. Freida Busman are ok with her discharging home.

## 2019-12-20 NOTE — Telephone Encounter (Signed)
I called and rescheduled the pt's hospital f/u appointment that was scheduled for today because the pt was at the ER with difficulty breathing.

## 2019-12-21 DIAGNOSIS — F0390 Unspecified dementia without behavioral disturbance: Secondary | ICD-10-CM | POA: Diagnosis not present

## 2019-12-24 ENCOUNTER — Telehealth (INDEPENDENT_AMBULATORY_CARE_PROVIDER_SITE_OTHER): Payer: Medicare PPO | Admitting: Internal Medicine

## 2019-12-24 ENCOUNTER — Other Ambulatory Visit: Payer: Self-pay

## 2019-12-24 ENCOUNTER — Encounter: Payer: Self-pay | Admitting: Internal Medicine

## 2019-12-24 VITALS — Ht 61.0 in | Wt 174.0 lb

## 2019-12-24 DIAGNOSIS — G3183 Dementia with Lewy bodies: Secondary | ICD-10-CM | POA: Diagnosis not present

## 2019-12-24 DIAGNOSIS — E871 Hypo-osmolality and hyponatremia: Secondary | ICD-10-CM

## 2019-12-24 DIAGNOSIS — J189 Pneumonia, unspecified organism: Secondary | ICD-10-CM

## 2019-12-24 DIAGNOSIS — F0280 Dementia in other diseases classified elsewhere without behavioral disturbance: Secondary | ICD-10-CM | POA: Diagnosis not present

## 2019-12-24 NOTE — Patient Instructions (Signed)
Community-Acquired Pneumonia, Adult Pneumonia is an infection of the lungs. It causes swelling in the airways of the lungs. Mucus and fluid may also build up inside the airways. One type of pneumonia can happen while a person is in a hospital. A different type can happen when a person is not in a hospital (community-acquired pneumonia).  What are the causes?  This condition is caused by germs (viruses, bacteria, or fungi). Some types of germs can be passed from one person to another. This can happen when you breathe in droplets from the cough or sneeze of an infected person. What increases the risk? You are more likely to develop this condition if you:  Have a long-term (chronic) disease, such as: ? Chronic obstructive pulmonary disease (COPD). ? Asthma. ? Cystic fibrosis. ? Congestive heart failure. ? Diabetes. ? Kidney disease.  Have HIV.  Have sickle cell disease.  Have had your spleen removed.  Do not take good care of your teeth and mouth (poor dental hygiene).  Have a medical condition that increases the risk of breathing in droplets from your own mouth and nose.  Have a weakened body defense system (immune system).  Are a smoker.  Travel to areas where the germs that cause this illness are common.  Are around certain animals or the places they live. What are the signs or symptoms?  A dry cough.  A wet (productive) cough.  Fever.  Sweating.  Chest pain. This often happens when breathing deeply or coughing.  Fast breathing or trouble breathing.  Shortness of breath.  Shaking chills.  Feeling tired (fatigue).  Muscle aches. How is this treated? Treatment for this condition depends on many things. Most adults can be treated at home. In some cases, treatment must happen in a hospital. Treatment may include:  Medicines given by mouth or through an IV tube.  Being given extra oxygen.  Respiratory therapy. In rare cases, treatment for very bad pneumonia  may include:  Using a machine to help you breathe.  Having a procedure to remove fluid from around your lungs. Follow these instructions at home: Medicines  Take over-the-counter and prescription medicines only as told by your doctor. ? Only take cough medicine if you are losing sleep.  If you were prescribed an antibiotic medicine, take it as told by your doctor. Do not stop taking the antibiotic even if you start to feel better. General instructions   Sleep with your head and neck raised (elevated). You can do this by sleeping in a recliner or by putting a few pillows under your head.  Rest as needed. Get at least 8 hours of sleep each night.  Drink enough water to keep your pee (urine) pale yellow.  Eat a healthy diet that includes plenty of vegetables, fruits, whole grains, low-fat dairy products, and lean protein.  Do not use any products that contain nicotine or tobacco. These include cigarettes, e-cigarettes, and chewing tobacco. If you need help quitting, ask your doctor.  Keep all follow-up visits as told by your doctor. This is important. How is this prevented? A shot (vaccine) can help prevent pneumonia. Shots are often suggested for:  People older than 84 years of age.  People older than 84 years of age who: ? Are having cancer treatment. ? Have long-term (chronic) lung disease. ? Have problems with their body's defense system. You may also prevent pneumonia if you take these actions:  Get the flu (influenza) shot every year.  Go to the dentist as   often as told.  Wash your hands often. If you cannot use soap and water, use hand sanitizer. Contact a doctor if:  You have a fever.  You lose sleep because your cough medicine does not help. Get help right away if:  You are short of breath and it gets worse.  You have more chest pain.  Your sickness gets worse. This is very serious if: ? You are an older adult. ? Your body's defense system is weak.  You  cough up blood. Summary  Pneumonia is an infection of the lungs.  Most adults can be treated at home. Some will need treatment in a hospital.  Drink enough water to keep your pee pale yellow.  Get at least 8 hours of sleep each night. This information is not intended to replace advice given to you by your health care provider. Make sure you discuss any questions you have with your health care provider. Document Revised: 11/22/2018 Document Reviewed: 03/30/2018 Elsevier Patient Education  2020 [employer]  

## 2019-12-25 DIAGNOSIS — I130 Hypertensive heart and chronic kidney disease with heart failure and stage 1 through stage 4 chronic kidney disease, or unspecified chronic kidney disease: Secondary | ICD-10-CM | POA: Diagnosis not present

## 2019-12-25 DIAGNOSIS — E1122 Type 2 diabetes mellitus with diabetic chronic kidney disease: Secondary | ICD-10-CM | POA: Diagnosis not present

## 2019-12-25 DIAGNOSIS — G3183 Dementia with Lewy bodies: Secondary | ICD-10-CM | POA: Diagnosis not present

## 2019-12-25 DIAGNOSIS — I5032 Chronic diastolic (congestive) heart failure: Secondary | ICD-10-CM | POA: Diagnosis not present

## 2019-12-25 DIAGNOSIS — N1832 Chronic kidney disease, stage 3b: Secondary | ICD-10-CM | POA: Diagnosis not present

## 2019-12-26 ENCOUNTER — Encounter: Payer: Self-pay | Admitting: Pulmonary Disease

## 2019-12-26 ENCOUNTER — Other Ambulatory Visit: Payer: Self-pay

## 2019-12-26 ENCOUNTER — Ambulatory Visit: Payer: Medicare PPO | Admitting: Pulmonary Disease

## 2019-12-26 VITALS — BP 120/72 | HR 67 | Ht 61.0 in | Wt 174.0 lb

## 2019-12-26 DIAGNOSIS — J181 Lobar pneumonia, unspecified organism: Secondary | ICD-10-CM | POA: Diagnosis not present

## 2019-12-26 DIAGNOSIS — E871 Hypo-osmolality and hyponatremia: Secondary | ICD-10-CM | POA: Diagnosis not present

## 2019-12-26 NOTE — Progress Notes (Signed)
Connie Poole    [id number]    02-Jun-1921  Primary Care Physician:Sanders, Melina Schools, MD  Referring Physician: Garey Ham, PA-C [address] [place],  [place] [address]  Chief complaint: Consult for cough  HPI: 86 year old with history of arthritis, CHF, Lewy body dementia, hypertension, hyperlipidemia, GERD, CKD.  Hospitalized from 4/29 to 12/18/2019 with acute on chronic diastolic heart failure, right upper lobe pneumonia with negative COVID-19 PCR.  Treated with diuretics, IV Rocephin, azithromycin and discharged on room air. Also evaluated for advanced Lewy body dementia.  Palliative care was consulted and made DNR.  Follow-up chest x-ray on on 5/6 showed resolution of infiltrate.  Patient is nonverbal and history provided by daughter.  She states that mom is doing well, slowly improving after her recent bout of pneumonia.  She has some cough with congestion, no fevers, chills.  Pets: No pets Occupation: Used to work as Museum/gallery conservator Exposures: No mold, hot tub, Financial controller.  No down pillows or comforters Smoking history: Never smoker Travel history: No significant travel history Relevant family history: No significant family history of lung disease   Outpatient Encounter Medications as of 12/26/2019  Medication Sig  . aspirin 81 MG tablet Take 81 mg by mouth daily.  Marland Kitchen atorvastatin (LIPITOR) 40 MG tablet TAKE 1 TABLET BY MOUTH EVERYDAY AT BEDTIME (Patient taking differently: Take 40 mg by mouth every Monday, Tuesday, Wednesday, Thursday, and Friday. )  . benzonatate (TESSALON) 100 MG capsule Take 100 mg by mouth 3 (three) times daily as needed for cough.  . brimonidine (ALPHAGAN P) 0.1 % SOLN Apply 1 drop to eye in the morning, at noon, and at bedtime.   . Calcium Carbonate-Vitamin D (CALCIUM 600+D PO) Take 1 tablet by mouth daily.   . carvedilol (COREG) 25 MG tablet TAKE 1 TABLET BY MOUTH TWICE A DAY (Patient taking  differently: Take 25 mg by mouth in the morning and at bedtime. )  . Chlorphen-Pseudoephed-APAP (CORICIDIN D PO) Take 1 tablet by mouth as needed (cough).   . Cholecalciferol (D3-50 PO) Take 50 mg by mouth daily.  . clonazePAM (KLONOPIN) 0.5 MG tablet TAKE 1/2 TO 1 TABLET BY MOUTH AT BEDTIME AS NEEDED (Patient taking differently: Take 0.25-0.5 mg by mouth at bedtime as needed for anxiety. )  . clonazePAM (KLONOPIN) 0.5 MG tablet Take 0.5-1 tablets (0.25-0.5 mg total) by mouth at bedtime as needed.  . Cranberry-Vit C-Probiotic-Ca (GNP CRANBERRY) TABS Take 1 tablet by mouth daily.   Marland Kitchen donepezil (ARICEPT) 5 MG tablet TAKE 1 TABLET BY MOUTH EVERY DAY IN THE EVENING (Patient taking differently: Take 5 mg by mouth daily. )  . dorzolamide (TRUSOPT) 2 % ophthalmic solution Place 1 drop into both eyes 2 (two) times daily.   . fluticasone (FLONASE) 50 MCG/ACT nasal spray Place 2 sprays into both nostrils daily.  . furosemide (LASIX) 40 MG tablet TAKE 1 TABLET BY MOUTH EVERY DAY (Patient taking differently: Take 40 mg by mouth daily. )  . Glucose Blood (ACCU-CHEK COMPACT TEST DRUM VI) by In Vitro route.  . hydrocortisone (ANUSOL-HC) 2.5 % rectal cream Place 1 application rectally 2 (two) times daily. (Patient taking differently: Place 1 application rectally 2 (two) times daily as needed for hemorrhoids or anal itching. )  . hydrocortisone (ANUSOL-HC) 25 MG suppository Place 1 suppository (25 mg total) rectally 2 (two) times daily. (Patient taking differently: Place 25 mg rectally 2 (two) times daily as needed for  hemorrhoids or anal itching. )  . ipratropium (ATROVENT) 0.06 % nasal spray Place 2 sprays into both nostrils every 8 (eight) hours as needed.   Marland Kitchen KLOR-CON M20 20 MEQ tablet TAKE 1 TABLET BY MOUTH EVERY DAY WITH FOOD (Patient taking differently: Take 20 mEq by mouth at bedtime. )  . loratadine (CLARITIN) 10 MG tablet Take 1 tablet (10 mg total) by mouth daily.  Marland Kitchen LUMIGAN 0.01 % SOLN Place 1 drop into  the left eye at bedtime.   . Melatonin 10 MG TBCR Take 1 tablet by mouth at bedtime as needed (sleep aid).   . Multiple Vitamins-Minerals (ADVANCED DIABETIC MULTIVITAMIN PO) Take 1 tablet by mouth daily.  . Multiple Vitamins-Minerals (PRESERVISION AREDS 2 PO) Take 1 tablet by mouth daily.  . Omega-3 Fatty Acids (FISH OIL) 1000 MG CAPS Take 1,000 mg by mouth daily.  . sertraline (ZOLOFT) 50 MG tablet TAKE 1 TABLET BY MOUTH EVERY DAY (Patient taking differently: Take 50 mg by mouth daily. )  . Suvorexant (BELSOMRA) 10 MG TABS Take 10 mg by mouth at bedtime as needed.  Marland Kitchen telmisartan (MICARDIS) 80 MG tablet TAKE 1 TABLET BY MOUTH EVERY DAY (Patient taking differently: Take 80 mg by mouth daily. )  . Turmeric ([facility] TURMERIC CURCUMIN) 500 MG CAPS Take 1 tablet by mouth daily.   No facility-administered encounter medications on file as of 12/26/2019.    Allergies as of 12/26/2019 - Review Complete 12/26/2019  Allergen Reaction Noted  . Celebrex [celecoxib] Nausea And Vomiting 06/09/2012  . Vioxx [rofecoxib] Nausea And Vomiting 01/14/2011    Past Medical History:  Diagnosis Date  . Arthritis   . CHF (congestive heart failure) (HCC)   . Dementia (HCC)   . Diabetes mellitus   . Glaucoma   . Hyperlipidemia   . Hypertension   . Sarcoidosis     Past Surgical History:  Procedure Laterality Date  . ABDOMINAL HYSTERECTOMY    . TOTAL KNEE ARTHROPLASTY      Family History  Problem Relation Age of Onset  . Stroke Mother   . Heart disease Father   . Prostate cancer Brother   . Cancer Brother   . Throat cancer Brother   . Alzheimer's disease Other   . Coronary artery disease Other   . Diabetes Other   . Hypertension Other     Social History   Socioeconomic History  . Marital status: Widowed    Spouse name: Not on file  . Number of children: Not on file  . Years of education: Not on file  . Highest education level: Not on file  Occupational History  . Not on file  Tobacco Use  .  Smoking status: Never Smoker  . Smokeless tobacco: Never Used  Substance and Sexual Activity  . Alcohol use: No    Alcohol/week: 0.0 standard drinks  . Drug use: Not Currently  . Sexual activity: Not Currently  Other Topics Concern  . Not on file  Social History Narrative   06/01/18 lives with daughter, Dois Davenport   Social Determinants of Health   Financial Resource Strain:   . Difficulty of Paying Living Expenses:   Food Insecurity:   . Worried About Programme researcher, broadcasting/film/video in the Last Year:   . Barista in the Last Year:   Transportation Needs:   . Freight forwarder (Medical):   Marland Kitchen Lack of Transportation (Non-Medical):   Physical Activity:   . Days of Exercise per Week:   .  Minutes of Exercise per Session:   Stress:   . Feeling of Stress :   Social Connections:   . Frequency of Communication with Friends and Family:   . Frequency of Social Gatherings with Friends and Family:   . Attends Religious Services:   . Active Member of Clubs or Organizations:   . Attends Archivist Meetings:   Marland Kitchen Marital Status:   Intimate Partner Violence:   . Fear of Current or Ex-Partner:   . Emotionally Abused:   Marland Kitchen Physically Abused:   . Sexually Abused:     Review of systems: Review of Systems  Constitutional: Negative for fever and chills.  HENT: Negative.   Eyes: Negative for blurred vision.  Respiratory: as per HPI  Cardiovascular: Negative for chest pain and palpitations.  Gastrointestinal: Negative for vomiting, diarrhea, blood per rectum. Genitourinary: Negative for dysuria, urgency, frequency and hematuria.  Musculoskeletal: Negative for myalgias, back pain and joint pain.  Skin: Negative for itching and rash.  Neurological: Negative for dizziness, tremors, focal weakness, seizures and loss of consciousness.  Endo/Heme/Allergies: Negative for environmental allergies.  Psychiatric/Behavioral: Negative for depression, suicidal ideas and hallucinations.  All other  systems reviewed and are negative.  Physical Exam: Blood pressure 120/72, pulse 67, height 5\' 1"  (1.549 m), weight 174 lb (78.9 kg), SpO2 91 %. Gen:      No acute distress HEENT:  EOMI, sclera anicteric Neck:     No masses; no thyromegaly Lungs:    Clear to auscultation bilaterally; normal respiratory effort CV:         Regular rate and rhythm; no murmurs Abd:      + bowel sounds; soft, non-tender; no palpable masses, no distension Ext:    No edema; adequate peripheral perfusion Skin:      Warm and dry; no rash Neuro: alert and oriented x 3 Psych: normal mood and affect  Data Reviewed: Imaging: Chest x-ray 12/13/2019-patchy opacity in the right upper lobe Chest x-ray 12/20/2019-no acute cardiopulmonary abnormality I have reviewed the images personally.  Assessment:  Follow-up post pneumonia Slowly improving.  Does not appear to be infected at present Continue nebs as needed Continue monitoring.  Does not need additional meds at present.  Advanced dementia Continue Aricept DNR status.  Goal is to keep her off of hospital Palliative care is following patient at home.  Plan/Recommendations: Continue nebs Supportive care Follow-up in 1 year  Marshell Garfinkel MD [facility] 12/26/2019, 3:01 PM  CC: Rodriguez-[place]worth, S*

## 2019-12-26 NOTE — Patient Instructions (Signed)
I am glad your mother is doing well with regard to her breathing I do not hear any wheezing today Continue the nebulizers at home as prescribed Follow-up in a year Please call sooner if there is any change in your symptoms.

## 2019-12-27 DIAGNOSIS — R5381 Other malaise: Secondary | ICD-10-CM | POA: Diagnosis not present

## 2019-12-27 DIAGNOSIS — R2689 Other abnormalities of gait and mobility: Secondary | ICD-10-CM | POA: Diagnosis not present

## 2019-12-29 ENCOUNTER — Other Ambulatory Visit: Payer: Self-pay | Admitting: Internal Medicine

## 2019-12-31 ENCOUNTER — Telehealth: Payer: Self-pay

## 2019-12-31 MED ORDER — AMOXICILLIN 500 MG PO TABS: ORAL_TABLET | ORAL | 0 refills | Status: AC

## 2019-12-31 NOTE — Telephone Encounter (Signed)
Dois Davenport [place] LVM stating she called EMS Saturday thinking pt Ladena Jacquez oxygen levels were low but when they arrived her oxygen levels were around 90. EMS stated pt Jakera Beaupre still had a crackling sound and suggested to call her PCP to ask for oxygen for the pt or more antibiotics. Rosette Reveal asked if antibiotics can be sent in for pt Vinnie Langton. Per JM send in amoxicillin

## 2020-01-01 DIAGNOSIS — I130 Hypertensive heart and chronic kidney disease with heart failure and stage 1 through stage 4 chronic kidney disease, or unspecified chronic kidney disease: Secondary | ICD-10-CM | POA: Diagnosis not present

## 2020-01-01 DIAGNOSIS — G3183 Dementia with Lewy bodies: Secondary | ICD-10-CM | POA: Diagnosis not present

## 2020-01-01 DIAGNOSIS — I5032 Chronic diastolic (congestive) heart failure: Secondary | ICD-10-CM | POA: Diagnosis not present

## 2020-01-01 DIAGNOSIS — D860 Sarcoidosis of lung: Secondary | ICD-10-CM | POA: Diagnosis not present

## 2020-01-01 DIAGNOSIS — E1122 Type 2 diabetes mellitus with diabetic chronic kidney disease: Secondary | ICD-10-CM | POA: Diagnosis not present

## 2020-01-03 ENCOUNTER — Other Ambulatory Visit: Payer: Medicare PPO | Admitting: Internal Medicine

## 2020-01-03 ENCOUNTER — Encounter: Payer: Self-pay | Admitting: Internal Medicine

## 2020-01-03 ENCOUNTER — Other Ambulatory Visit: Payer: Self-pay

## 2020-01-03 DIAGNOSIS — Z7189 Other specified counseling: Secondary | ICD-10-CM

## 2020-01-03 DIAGNOSIS — Z515 Encounter for palliative care: Secondary | ICD-10-CM | POA: Diagnosis not present

## 2020-01-03 NOTE — Progress Notes (Addendum)
May 20th, 2020 [facility] Telephone: [phone] Fax: [phone]     Addendum: 01/05/2020 Communication from daughter [name], who requests initiation of Remeron for appetite stimulation and hoped for additional effect of helping her to sleep at night. I e-prescribed Remeron 15mg  (take 1/2 tab at hs) dist #30, 4 refills, to [facility].   PATIENT NAME: Connie Poole DOB: [birth year] MRN: [id number] [place] [id number] Pharmacy: [facility].   PRIMARY CARE PROVIDER:   Glendale Chard, MD   REFERRING PROVIDER:  Glendale Chard, [address] [place], [place] [address]   RESPONSIBLE PARTY:   Dolores Patty (daughter) [phone]   Assessment/Recommendations:  1.Advanced Care Planning: A.Directives: DNR and MOST form in place.   Goals of Care:  -Daughter reconciling patient cognitive and functional decline with natural progression changes of aging and dementia. She is leaning now towards comfort care, with probable referral to hospice services after patient concludes current course of PT/OT. She wishes to avoid future hospitalizations. -We reviewed medication list to decreased pill burden (discontinued Cholecalciferol, Lipitor, Calcium-Vit D, Klor Con, Melatonin, MTV, Fish Oil, Tumeric). I okayed her "official" discontinuation of CBGs (wasn't doing anyway; patient not on any antihyperglycemic agents and CBGs were not running high historically).  Keeping BP meds as SBP runs 160's without, 120's treated.  -Decreased Lasix dose to  tab (20mg ) qd; her oral/ fluid intake has declined, and she has been struggling with taking the potassium pill (now discontinued).    2. Dementia with behavioral disturbances:  Patient is s/t 4/29-12/18/2019 hospitalization with R UL pneumonia (CAP vs aspiration), with subsequent ER visit (12/19/19) for productive cough. Eval reassuring. CXR revealing resolution of  infiltrates.  Increased somnolence: awake about 4-6 hours/d. Constantly confused; often doesn't recognize her daughter. Continues nocturnal hallucinations but now no longer frightening to patient. Hallucinations have changed in character from threatening (strangers in room/in her bed laying on her) to more comforting (seeing deceased relatives in a reassuring way). Continues Aricept. Belsorma at hs for h/o insomnia. Melatonin wasn't working for her (discontinued).  Daughter reports occasional cough productive frothy with sometimes yellow tinged secretions. Reassured daughter this was non-worrisome. Discussed that patient may have age related symptoms silent aspiration / pooling of oral secretions, with need for clearing cough. Recent home Speech Therapy reported by daughter as no signs of aspiration.   Patient with diminished appetite; yesterday just few crackers and small amount milk. Needs to be fed with strong encouragement. We discussed adding small dose of Remeron help to stimulate appetite, but daughter really wishes to avoid; doesn't want to prolong what seems to be a natural decline. (12/26/19) OV Dr. Marshell Garfinkel (Pulmonary): weight 174lbs, ht 5'1", BMI 33kg/m2.   Patient is 100% dependent for all ADLs. She will episodically be able to transfer laying to sitting; needs transfer assist. Ambulating small distances within the home with PT/gait belt/hands on guidance assistance. Otherwise wheelchair bound. Mostly continent with toileting schedule.    3. Family supports:  As well as providing patient care, daughter is also caring for her elderly mother-in-law in the home. Has home aide service 15 hrs/week divided schedule (government program) for am care. Private pay 9pm-6am nights 3 nights/week. Daughter looking into augmenting service so she and her husband can get out for an occasional evening.    4. Follow up: daughter will call me for anticipated hospice eval, pending patient completing of  home PT services.   I  spent 60 minutes providing this consultation, from 2pm to 3pm. More than 50% of the time in this consultation was spent coordinating communication, interview of daughter and patient, reconciling medication list, and charting.    HPI/INTERVAL NOTE: Sweet 86yo AA female with h/o Lewy Body dementia with behavioral disturbances, HTN, DM, and CHF.  12/19/19 ER for dyspnea; w/u reassuring. Resolution of infiltrate.  4/29-12/18/2019 hospitalized R UL pneumonia (CAP vs aspiration) This is a f/u Palliative Care follow up from 11/02/2019.   CODE STATUS: DNR-comfort; no antibiotic's, no IVF's no feeding tube   PPS: 30%  PAST MEDICAL HISTORY:  Past Medical History:  Diagnosis Date  . Arthritis   . CHF (congestive heart failure) (HCC)   . Dementia (HCC)   . Diabetes mellitus   . Glaucoma   . Hyperlipidemia   . Hypertension   . Sarcoidosis     SOCIAL HX:  Social History   Tobacco Use  . Smoking status: Never Smoker  . Smokeless tobacco: Never Used  Substance Use Topics  . Alcohol use: No    Alcohol/week: 0.0 standard drinks    ALLERGIES:  Allergies  Allergen Reactions  . Celebrex [Celecoxib] Nausea And Vomiting  . Vioxx [Rofecoxib] Nausea And Vomiting     PERTINENT MEDICATIONS:  Outpatient Encounter Medications as of 01/03/2020  Medication Sig  . amoxicillin (AMOXIL) 500 MG tablet Take one tablet twice a day for seven days  . aspirin 81 MG tablet Take 81 mg by mouth daily.  . benzonatate (TESSALON) 100 MG capsule Take 100 mg by mouth 3 (three) times daily as needed for cough.  . brimonidine (ALPHAGAN P) 0.1 % SOLN Apply 1 drop to eye in the morning, at noon, and at bedtime.   . carvedilol (COREG) 25 MG tablet Take 1 tablet (25 mg total) by mouth in the morning and at bedtime.  . Cranberry-Vit C-Probiotic-Ca (GNP CRANBERRY) TABS Take 1 tablet by mouth daily.   Marland Kitchen donepezil (ARICEPT) 5 MG tablet TAKE 1 TABLET BY MOUTH EVERY DAY IN THE EVENING (Patient taking  differently: Take 5 mg by mouth daily. )  . dorzolamide (TRUSOPT) 2 % ophthalmic solution Place 1 drop into both eyes 2 (two) times daily.   . fluticasone (FLONASE) 50 MCG/ACT nasal spray Place 2 sprays into both nostrils daily.  . furosemide (LASIX) 40 MG tablet TAKE 1 TABLET BY MOUTH EVERY DAY (Patient taking differently: Take 20 mg by mouth daily. 01/03/20 decreased Lasix to 20mg /day (1/2 of 40mg  tab))  . hydrocortisone (ANUSOL-HC) 2.5 % rectal cream Place 1 application rectally 2 (two) times daily. (Patient taking differently: Place 1 application rectally 2 (two) times daily as needed for hemorrhoids or anal itching. )  . hydrocortisone (ANUSOL-HC) 25 MG suppository Place 1 suppository (25 mg total) rectally 2 (two) times daily. (Patient taking differently: Place 25 mg rectally 2 (two) times daily as needed for hemorrhoids or anal itching. )  . loratadine (CLARITIN) 10 MG tablet Take 1 tablet (10 mg total) by mouth daily.  LUMIGAN 0.01 % SOLN Place 1 drop into the left eye at bedtime.   . mirtazapine (REMERON) 15 MG tablet Take 7.5 mg by mouth at bedtime. Take 1/2 tab (7.5mg ) qhs for appetite stimulation/help with sleep  . Multiple Vitamins-Minerals (PRESERVISION AREDS 2 PO) Take 1 tablet by mouth daily.  . sertraline (ZOLOFT) 50 MG tablet TAKE 1 TABLET BY MOUTH EVERY DAY (Patient taking differently: Take 50 mg by mouth daily. )  . Suvorexant (BELSOMRA) 10 MG TABS Take  10 mg by mouth at bedtime as needed.  Marland Kitchen telmisartan (MICARDIS) 80 MG tablet TAKE 1 TABLET BY MOUTH EVERY DAY (Patient taking differently: Take 80 mg by mouth daily. )  . [DISCONTINUED] Cholecalciferol (D3-50 PO) Take 50 mg by mouth daily.  . Chlorphen-Pseudoephed-APAP (CORICIDIN D PO) Take 1 tablet by mouth as needed (cough).   . clonazePAM (KLONOPIN) 0.5 MG tablet TAKE 1/2 TO 1 TABLET BY MOUTH AT BEDTIME AS NEEDED (Patient not taking: No sig reported)  . clonazePAM (KLONOPIN) 0.5 MG tablet Take 0.5-1 tablets (0.25-0.5 mg total)  by mouth at bedtime as needed. (Patient not taking: Reported on 01/03/2020)  . ipratropium (ATROVENT) 0.06 % nasal spray Place 2 sprays into both nostrils every 8 (eight) hours as needed.   . [DISCONTINUED] atorvastatin (LIPITOR) 40 MG tablet TAKE 1 TABLET BY MOUTH EVERYDAY AT BEDTIME (Patient taking differently: Take 40 mg by mouth every Monday, Tuesday, Wednesday, Thursday, and Friday. )  . [DISCONTINUED] Calcium Carbonate-Vitamin D (CALCIUM 600+D PO) Take 1 tablet by mouth daily.   . [DISCONTINUED] Glucose Blood (ACCU-CHEK COMPACT TEST DRUM VI) by In Vitro route.  . [DISCONTINUED] KLOR-CON M20 20 MEQ tablet TAKE 1 TABLET BY MOUTH EVERY DAY WITH FOOD (Patient taking differently: Take 20 mEq by mouth at bedtime. )  . [DISCONTINUED] Melatonin 10 MG TBCR Take 1 tablet by mouth at bedtime as needed (sleep aid).   . [DISCONTINUED] Multiple Vitamins-Minerals (ADVANCED DIABETIC MULTIVITAMIN PO) Take 1 tablet by mouth daily.  . [DISCONTINUED] Omega-3 Fatty Acids (FISH OIL) 1000 MG CAPS Take 1,000 mg by mouth daily.  . [DISCONTINUED] Turmeric ([facility] TURMERIC CURCUMIN) 500 MG CAPS Take 1 tablet by mouth daily.   No facility-administered encounter medications on file as of 01/03/2020.    PHYSICAL EXAM:  Elderly female sitting slumped well over to her left side in wheelchair, fast asleep.  Daughter did not wish me to disturb her for exam. Patient is breathing without distress; RR 16.  Anselm Lis, NP

## 2020-01-04 DIAGNOSIS — J411 Mucopurulent chronic bronchitis: Secondary | ICD-10-CM | POA: Diagnosis not present

## 2020-01-05 ENCOUNTER — Encounter: Payer: Self-pay | Admitting: Internal Medicine

## 2020-01-05 ENCOUNTER — Telehealth: Payer: Self-pay | Admitting: Internal Medicine

## 2020-01-05 NOTE — Telephone Encounter (Signed)
8am: TC from [name], who requests initiation of Remeron for appetite stimulation and hoped for additional effect of helping her to sleep at night. I e-prescribed Remeron 15mg  (take 1/2 tab at hs) dist #30, 4 refills, to [facility] .  [employer] NP-C [phone]

## 2020-01-06 NOTE — Progress Notes (Signed)
Virtual Visit via Video   This visit type was conducted due to national recommendations for restrictions regarding the COVID-19 Pandemic (e.g. social distancing) in an effort to limit this patient's exposure and mitigate transmission in our community.  Due to her co-morbid illnesses, this patient is at least at moderate risk for complications without adequate follow up.  This format is felt to be most appropriate for this patient at this time.  All issues noted in this document were discussed and addressed.  A limited physical exam was performed with this format.    This visit type was conducted due to national recommendations for restrictions regarding the COVID-19 Pandemic (e.g. social distancing) in an effort to limit this patient's exposure and mitigate transmission in our community.  Patients identity confirmed using two different identifiers.  This format is felt to be most appropriate for this patient at this time.  All issues noted in this document were discussed and addressed.  No physical exam was performed (except for noted visual exam findings with Video Visits).    Date:  01/06/2020   ID:  Connie Poole, DOB 10-12-1920, MRN [id number]  Patient Location:  Home, accompanied by her daughter  Provider location:   Office    Chief Complaint:  "I have hospital f/u"  History of Present Illness:    Connie Poole is a 86 y.o. female who presents via video conferencing for a telehealth visit today.    The patient does not have symptoms concerning for COVID-19 infection (fever, chills, cough, or new shortness of breath).   She presents today for virtual visit. She prefers this method of contact due to COVID-19 pandemic.  She presents today, virtually, for her hospital f/u. She is accompanied by her daughter.  She was hospitalized from 4/29- 5/4 for community acquired pneumonia.  She presented to hospital with a 1 day history of progressive worsening lethargy, weakness, decreased  appetite, shortness of breath and a productive cough of yellowish sputum. Chest x-ray was concerning for right upper lobe infiltrate.   Covid 19 PCR negative. Patient pancultured with no growth to date.  Patient improved clinically during the hospitalization. Urine strep pneumococcus antigen negative. Concern for component of volume overloadand patient was given some IV Lasix with good urine output.  Patient was maintained on IV Rocephin and IV azithromycin throughout the hospitalization as well as Flonase, Claritin, scheduled duo nebs.  Patient was also started on a dysphagia 1 diet after being assessed by speech therapy.  Patient completed a full course of IV antibiotics during the hospitalization and be discharged home in stable and improved condition.  Patient with sats of 95% on room air by day of discharge.       Past Medical History:  Diagnosis Date  . Arthritis   . CHF (congestive heart failure) (HCC)   . Dementia (HCC)   . Diabetes mellitus   . Glaucoma   . Hyperlipidemia   . Hypertension   . Sarcoidosis    Past Surgical History:  Procedure Laterality Date  . ABDOMINAL HYSTERECTOMY    . TOTAL KNEE ARTHROPLASTY       Current Meds  Medication Sig  . aspirin 81 MG tablet Take 81 mg by mouth daily.  . benzonatate (TESSALON) 100 MG capsule Take 100 mg by mouth 3 (three) times daily as needed for cough.  . brimonidine (ALPHAGAN P) 0.1 % SOLN Apply 1 drop to eye in the morning, at noon, and at bedtime.   Marland Kitchen  Chlorphen-Pseudoephed-APAP (CORICIDIN D PO) Take 1 tablet by mouth as needed (cough).   . clonazePAM (KLONOPIN) 0.5 MG tablet TAKE 1/2 TO 1 TABLET BY MOUTH AT BEDTIME AS NEEDED (Patient not taking: No sig reported)  . clonazePAM (KLONOPIN) 0.5 MG tablet Take 0.5-1 tablets (0.25-0.5 mg total) by mouth at bedtime as needed. (Patient not taking: Reported on 01/03/2020)  . Cranberry-Vit C-Probiotic-Ca (GNP CRANBERRY) TABS Take 1 tablet by mouth daily.   Marland Kitchen donepezil (ARICEPT) 5 MG  tablet TAKE 1 TABLET BY MOUTH EVERY DAY IN THE EVENING (Patient taking differently: Take 5 mg by mouth daily. )  . dorzolamide (TRUSOPT) 2 % ophthalmic solution Place 1 drop into both eyes 2 (two) times daily.   . fluticasone (FLONASE) 50 MCG/ACT nasal spray Place 2 sprays into both nostrils daily.  . furosemide (LASIX) 40 MG tablet TAKE 1 TABLET BY MOUTH EVERY DAY (Patient taking differently: Take 20 mg by mouth daily. 01/03/20 decreased Lasix to 20mg /day (1/2 of 40mg  tab))  . hydrocortisone (ANUSOL-HC) 2.5 % rectal cream Place 1 application rectally 2 (two) times daily. (Patient taking differently: Place 1 application rectally 2 (two) times daily as needed for hemorrhoids or anal itching. )  . hydrocortisone (ANUSOL-HC) 25 MG suppository Place 1 suppository (25 mg total) rectally 2 (two) times daily. (Patient taking differently: Place 25 mg rectally 2 (two) times daily as needed for hemorrhoids or anal itching. )  . ipratropium (ATROVENT) 0.06 % nasal spray Place 2 sprays into both nostrils every 8 (eight) hours as needed.   . loratadine (CLARITIN) 10 MG tablet Take 1 tablet (10 mg total) by mouth daily.  LUMIGAN 0.01 % SOLN Place 1 drop into the left eye at bedtime.   . Multiple Vitamins-Minerals (PRESERVISION AREDS 2 PO) Take 1 tablet by mouth daily.  . sertraline (ZOLOFT) 50 MG tablet TAKE 1 TABLET BY MOUTH EVERY DAY (Patient taking differently: Take 50 mg by mouth daily. )  . Suvorexant (BELSOMRA) 10 MG TABS Take 10 mg by mouth at bedtime as needed.  telmisartan (MICARDIS) 80 MG tablet TAKE 1 TABLET BY MOUTH EVERY DAY (Patient taking differently: Take 80 mg by mouth daily. )  . [DISCONTINUED] atorvastatin (LIPITOR) 40 MG tablet TAKE 1 TABLET BY MOUTH EVERYDAY AT BEDTIME (Patient taking differently: Take 40 mg by mouth every Monday, Tuesday, Wednesday, Thursday, and Friday. )  . [DISCONTINUED] Calcium Carbonate-Vitamin D (CALCIUM 600+D PO) Take 1 tablet by mouth daily.   . [DISCONTINUED]  carvedilol (COREG) 25 MG tablet TAKE 1 TABLET BY MOUTH TWICE A DAY (Patient taking differently: Take 25 mg by mouth in the morning and at bedtime. )  . [DISCONTINUED] Cholecalciferol (D3-50 PO) Take 50 mg by mouth daily.  . [DISCONTINUED] Glucose Blood (ACCU-CHEK COMPACT TEST DRUM VI) by In Vitro route.  . [DISCONTINUED] KLOR-CON M20 20 MEQ tablet TAKE 1 TABLET BY MOUTH EVERY DAY WITH FOOD (Patient taking differently: Take 20 mEq by mouth at bedtime. )  . [DISCONTINUED] Melatonin 10 MG TBCR Take 1 tablet by mouth at bedtime as needed (sleep aid).   . [DISCONTINUED] Multiple Vitamins-Minerals (ADVANCED DIABETIC MULTIVITAMIN PO) Take 1 tablet by mouth daily.  . [DISCONTINUED] Omega-3 Fatty Acids (FISH OIL) 1000 MG CAPS Take 1,000 mg by mouth daily.  . [DISCONTINUED] Turmeric ([employer] TURMERIC CURCUMIN) 500 MG CAPS Take 1 tablet by mouth daily.     Allergies:   Celebrex [celecoxib] and Vioxx [rofecoxib]   Social History   Tobacco Use  . Smoking status: Never Smoker  .  Smokeless tobacco: Never Used  Substance Use Topics  . Alcohol use: No    Alcohol/week: 0.0 standard drinks  . Drug use: Not Currently     Family Hx: The patient's family history includes Alzheimer's disease in an other family member; Cancer in her brother; Coronary artery disease in an other family member; Diabetes in an other family member; Heart disease in her father; Hypertension in an other family member; Prostate cancer in her brother; Stroke in her mother; Throat cancer in her brother.  ROS:   Please see the history of present illness.    Review of Systems  Constitutional: Negative.   Respiratory: Positive for cough.   Cardiovascular: Negative.   Gastrointestinal: Negative.   Neurological: Negative.   Psychiatric/Behavioral: Negative.     All other systems reviewed and are negative.   Labs/Other Tests and Data Reviewed:    Recent Labs: 11/28/2019: TSH 1.780 12/13/2019: ALT 30 12/14/2019: Magnesium 2.1 12/17/2019:  B Natriuretic Peptide 894.0 12/20/2019: BUN 33; Creatinine, Ser 1.42; Hemoglobin 9.7; Platelets 410; Potassium 3.7; Sodium 143   Recent Lipid Panel Lab Results  Component Value Date/Time   CHOL 160 11/28/2019 05:16 PM   TRIG 148 11/28/2019 05:16 PM   HDL 36 (L) 11/28/2019 05:16 PM   CHOLHDL 4.4 11/28/2019 05:16 PM   LDLCALC 98 11/28/2019 05:16 PM    Wt Readings from Last 3 Encounters:  12/26/19 174 lb (78.9 kg)  12/24/19 174 lb (78.9 kg)  12/20/19 174 lb (78.9 kg)     Exam:    Vital Signs:  Ht 5\' 1"  (1.549 m)   Wt 174 lb (78.9 kg) Comment: pt provided  BMI 32.88 kg/m     Physical Exam  Constitutional: She is oriented to person, place, and time and well-developed, well-nourished, and in no distress.  She appears somnolent, she responds to voice commands.   HENT:  Head: Normocephalic and atraumatic.  Pulmonary/Chest: Effort normal.  Musculoskeletal:     Cervical back: Normal range of motion.  Neurological: She is alert and oriented to person, place, and time.  Psychiatric: Affect normal.  Nursing note and vitals reviewed.   ASSESSMENT & PLAN:     1. Community acquired pneumonia of right upper lobe of lung  TCM PERFORMED. A MEMBER OF THE CLINICAL TEAM SPOKE WITH THE PATIENT UPON DISCHARGE. DISCHARGE SUMMARY WAS REVIEWED IN FULL DETAIL DURING THE VISIT. MEDS RECONCILED AND COMPARED TO DISCHARGE MEDS. MEDICATION LIST WAS UPDATED AND REVIEWED WITH THE PATIENT. GREATER THAN 50% FACE TO FACE TIME WAS SPENT IN COUNSELING AND COORDINATION OF CARE. ALL QUESTIONS WERE ANSWERED TO THE SATISFACTION OF THE PATIENT. She has received full course of iv abx during her hospitalization.  She is encouraged to take Tessalon perles prn. Also advised to avoid dairy for now. She may also benefit from chest PT. Patient's daughter advised that she may be SOB for sm  2. Hyponatremia  She agrees to have f/u bloodwork performed. I will send order to home health nurse, since daughter has difficulty  getting transporting her to the office.    3. Lewy body dementia without behavioral disturbance (HCC)  Chronic. She has advanced Lewy body dementia at baseline.  Patient will continue under the care of Palliative care.  Patient was made DNR while in the hospital.   Patient will continue with donepezil.  Patient will be discharged home with palliative care following in the outpatient setting   COVID-19 Education: The signs and symptoms of COVID-19 were discussed with the patient and how  to seek care for testing (follow up with PCP or arrange E-visit).  The importance of social distancing was discussed today.  Patient Risk:   After full review of this patients clinical status, I feel that they are at least moderate risk at this time.  Time:   I personally spent 30 minutes face-to-face and non-face-to-face in the care of this patient, which includes all pre-, intra-, and post visit time on the date of service.      Medication Adjustments/Labs and Tests Ordered: Current medicines are reviewed at length with the patient today.  Concerns regarding medicines are outlined above.   Tests Ordered: No orders of the defined types were placed in this encounter.   Medication Changes: No orders of the defined types were placed in this encounter.   Disposition:  Follow up in 3 month(s)  Signed, Gwynneth Aliment, MD

## 2020-01-07 ENCOUNTER — Other Ambulatory Visit: Payer: Self-pay | Admitting: Internal Medicine

## 2020-01-07 DIAGNOSIS — J189 Pneumonia, unspecified organism: Secondary | ICD-10-CM

## 2020-01-07 DIAGNOSIS — F0280 Dementia in other diseases classified elsewhere without behavioral disturbance: Secondary | ICD-10-CM

## 2020-01-09 DIAGNOSIS — G3183 Dementia with Lewy bodies: Secondary | ICD-10-CM | POA: Diagnosis not present

## 2020-01-09 DIAGNOSIS — F0280 Dementia in other diseases classified elsewhere without behavioral disturbance: Secondary | ICD-10-CM

## 2020-01-09 DIAGNOSIS — R443 Hallucinations, unspecified: Secondary | ICD-10-CM

## 2020-01-09 DIAGNOSIS — I130 Hypertensive heart and chronic kidney disease with heart failure and stage 1 through stage 4 chronic kidney disease, or unspecified chronic kidney disease: Secondary | ICD-10-CM

## 2020-01-09 DIAGNOSIS — N183 Chronic kidney disease, stage 3 unspecified: Secondary | ICD-10-CM

## 2020-01-09 DIAGNOSIS — R634 Abnormal weight loss: Secondary | ICD-10-CM | POA: Diagnosis not present

## 2020-01-09 DIAGNOSIS — I5030 Unspecified diastolic (congestive) heart failure: Secondary | ICD-10-CM

## 2020-01-09 DIAGNOSIS — E1122 Type 2 diabetes mellitus with diabetic chronic kidney disease: Secondary | ICD-10-CM

## 2020-01-26 ENCOUNTER — Other Ambulatory Visit: Payer: Self-pay | Admitting: Internal Medicine

## 2020-02-02 ENCOUNTER — Other Ambulatory Visit: Payer: Self-pay | Admitting: Internal Medicine

## 2020-02-05 ENCOUNTER — Encounter: Payer: Self-pay | Admitting: Internal Medicine

## 2020-02-07 ENCOUNTER — Other Ambulatory Visit: Payer: Self-pay | Admitting: Internal Medicine

## 2020-02-14 DEATH — deceased

## 2020-02-21 ENCOUNTER — Other Ambulatory Visit: Payer: Self-pay | Admitting: Internal Medicine

## 2020-03-06 ENCOUNTER — Other Ambulatory Visit: Payer: Self-pay | Admitting: Internal Medicine

## 2020-03-15 ENCOUNTER — Other Ambulatory Visit: Payer: Self-pay | Admitting: Internal Medicine

## 2020-03-20 ENCOUNTER — Other Ambulatory Visit: Payer: Self-pay | Admitting: Internal Medicine

## 2020-05-28 ENCOUNTER — Ambulatory Visit: Payer: Medicare PPO | Admitting: Internal Medicine

## 2021-08-08 IMAGING — DX DG CHEST 1V PORT
1 series · 1 of 1 positions shown · non-contrast
Comparison: Radiograph 2 days ago 12/14/2019

CLINICAL DATA: Worsening cough. Shortness of breath.

EXAM:
PORTABLE CHEST 1 VIEW

[chest ap]
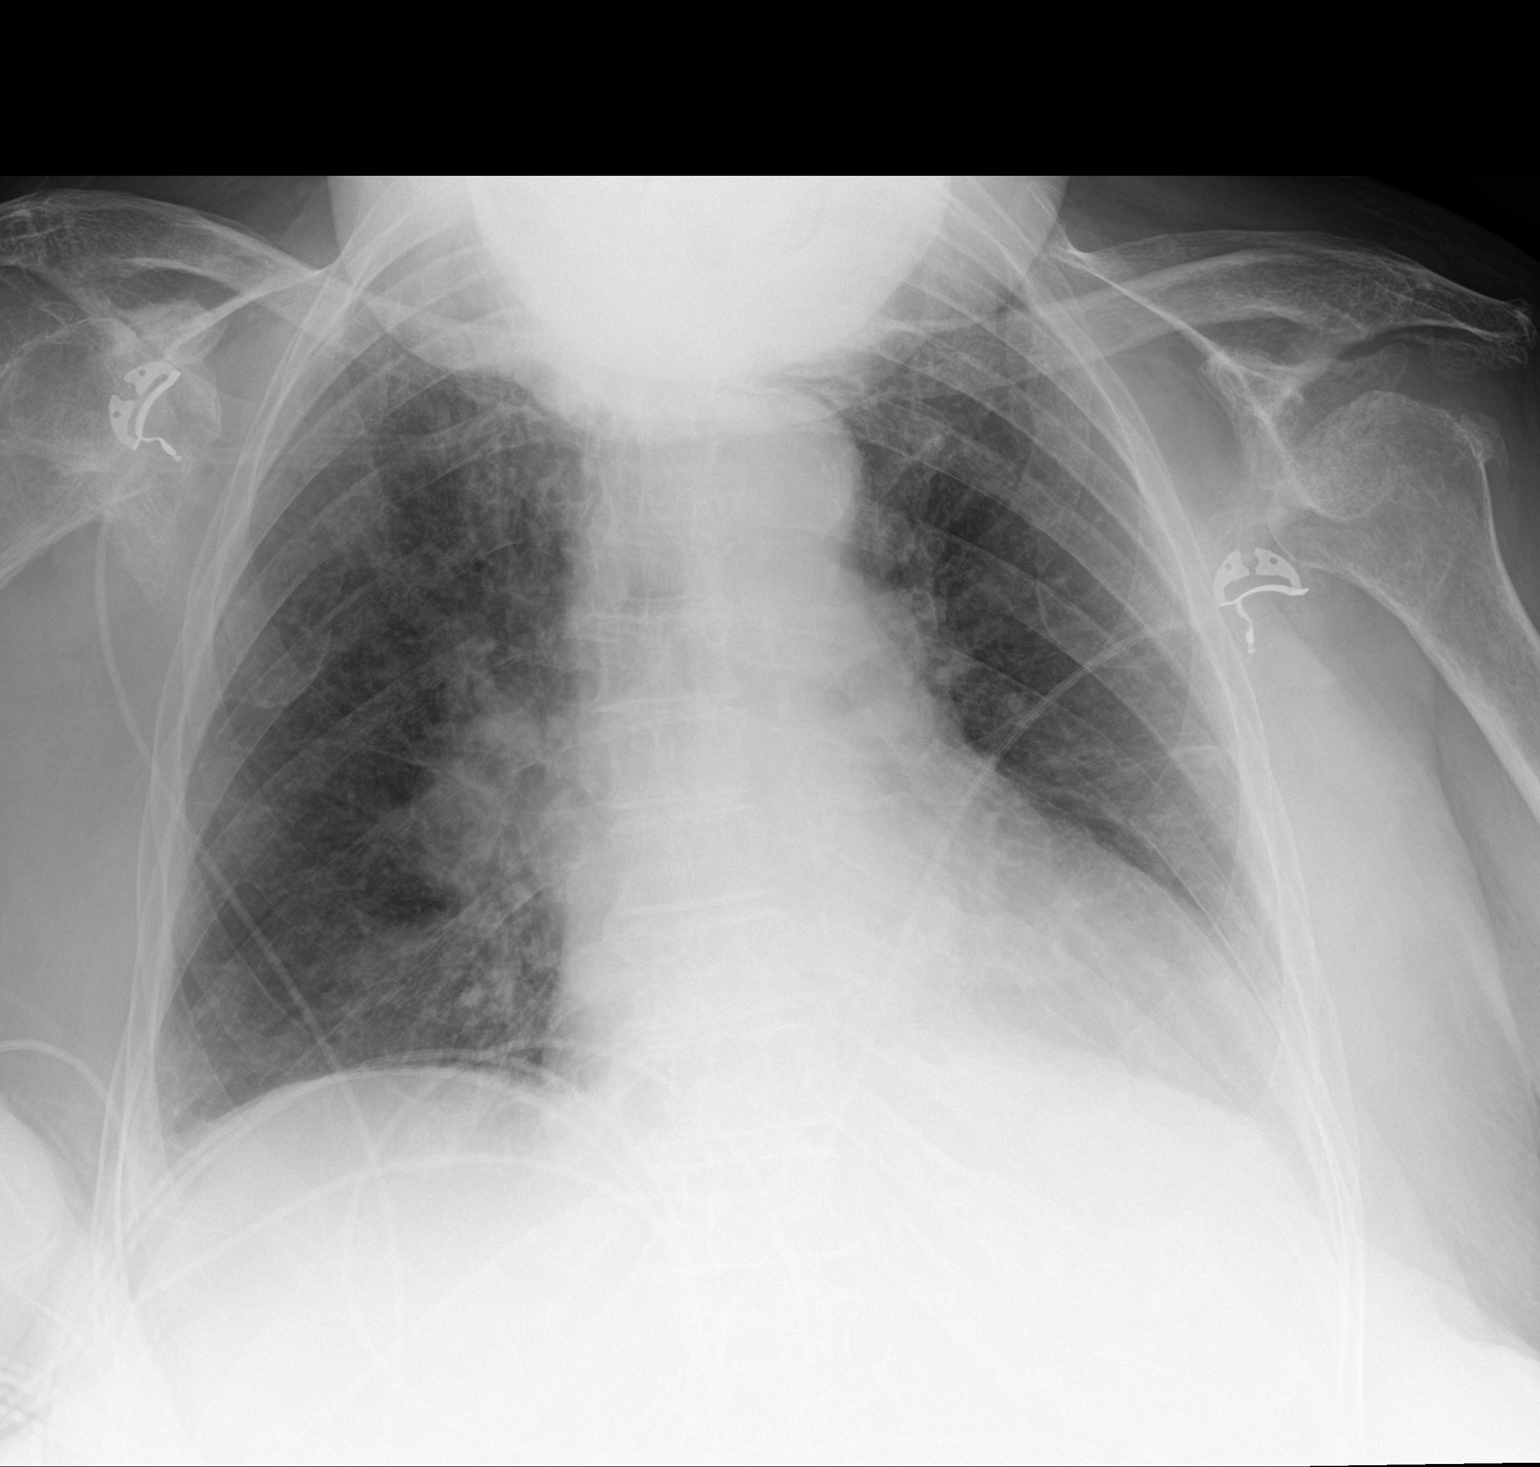

[1 of 1 positions shown; findings below may reference images not displayed]

FINDINGS: Mild cardiomegaly is stable. Improving patchy right suprahilar
opacity with mild residual. Subsegmental atelectasis in the left mid
lung. Possible small pleural effusions. Patient's chin obscures the
apices. No pulmonary edema. No pneumothorax. Advanced degenerative
change of the shoulders.
IMPRESSION: 1. Improving patchy right suprahilar opacity with mild residual.
2. Subsegmental atelectasis in the left mid lung.
3. Possible small pleural effusions.
